# Patient Record
Sex: Female | Born: 1980 | Race: Black or African American | Hispanic: No | Marital: Single | Smoking: Never smoker
Health system: Southern US, Community
[De-identification: ages and names within clinical notes are randomized; demographics above are authoritative.]

## PROBLEM LIST (undated history)

## (undated) ENCOUNTER — Inpatient Hospital Stay (HOSPITAL_COMMUNITY): Payer: Self-pay

## (undated) ENCOUNTER — Inpatient Hospital Stay (HOSPITAL_COMMUNITY): Payer: Medicare Other

## (undated) ENCOUNTER — Ambulatory Visit: Admission: EM | Payer: Medicare Other

## (undated) DIAGNOSIS — T7840XA Allergy, unspecified, initial encounter: Secondary | ICD-10-CM

## (undated) DIAGNOSIS — O139 Gestational [pregnancy-induced] hypertension without significant proteinuria, unspecified trimester: Secondary | ICD-10-CM

## (undated) DIAGNOSIS — M1990 Unspecified osteoarthritis, unspecified site: Secondary | ICD-10-CM

## (undated) DIAGNOSIS — G5603 Carpal tunnel syndrome, bilateral upper limbs: Secondary | ICD-10-CM

## (undated) DIAGNOSIS — Z8619 Personal history of other infectious and parasitic diseases: Secondary | ICD-10-CM

## (undated) DIAGNOSIS — G43909 Migraine, unspecified, not intractable, without status migrainosus: Secondary | ICD-10-CM

## (undated) DIAGNOSIS — D649 Anemia, unspecified: Secondary | ICD-10-CM

## (undated) DIAGNOSIS — B999 Unspecified infectious disease: Secondary | ICD-10-CM

## (undated) DIAGNOSIS — B379 Candidiasis, unspecified: Secondary | ICD-10-CM

## (undated) DIAGNOSIS — O24419 Gestational diabetes mellitus in pregnancy, unspecified control: Secondary | ICD-10-CM

## (undated) DIAGNOSIS — Z973 Presence of spectacles and contact lenses: Secondary | ICD-10-CM

## (undated) DIAGNOSIS — I10 Essential (primary) hypertension: Secondary | ICD-10-CM

## (undated) DIAGNOSIS — R569 Unspecified convulsions: Secondary | ICD-10-CM

## (undated) DIAGNOSIS — F319 Bipolar disorder, unspecified: Secondary | ICD-10-CM

## (undated) HISTORY — PX: WISDOM TOOTH EXTRACTION: SHX21

## (undated) HISTORY — DX: Personal history of other infectious and parasitic diseases: Z86.19

## (undated) HISTORY — DX: Candidiasis, unspecified: B37.9

## (undated) HISTORY — DX: Unspecified osteoarthritis, unspecified site: M19.90

## (undated) HISTORY — DX: Carpal tunnel syndrome, bilateral upper limbs: G56.03

## (undated) HISTORY — DX: Presence of spectacles and contact lenses: Z97.3

## (undated) HISTORY — DX: Essential (primary) hypertension: I10

## (undated) HISTORY — DX: Unspecified infectious disease: B99.9

## (undated) HISTORY — DX: Allergy, unspecified, initial encounter: T78.40XA

## (undated) HISTORY — DX: Anemia, unspecified: D64.9

## (undated) HISTORY — PX: TONSILLECTOMY: SUR1361

---

## 2010-06-13 ENCOUNTER — Emergency Department (HOSPITAL_COMMUNITY): Admission: EM | Admit: 2010-06-13 | Discharge: 2010-06-13 | Payer: Self-pay | Admitting: Family Medicine

## 2010-07-16 ENCOUNTER — Emergency Department (HOSPITAL_COMMUNITY): Admission: EM | Admit: 2010-07-16 | Discharge: 2010-07-16 | Payer: Self-pay | Admitting: Emergency Medicine

## 2010-10-09 ENCOUNTER — Emergency Department (HOSPITAL_COMMUNITY)
Admission: EM | Admit: 2010-10-09 | Discharge: 2010-10-09 | Payer: Self-pay | Source: Home / Self Care | Admitting: Family Medicine

## 2010-12-03 ENCOUNTER — Inpatient Hospital Stay (INDEPENDENT_AMBULATORY_CARE_PROVIDER_SITE_OTHER)
Admission: RE | Admit: 2010-12-03 | Discharge: 2010-12-03 | Disposition: A | Discharge status: Home / Self Care | Payer: Medicare Other | Source: Ambulatory Visit | Attending: Emergency Medicine | Admitting: Emergency Medicine

## 2010-12-03 DIAGNOSIS — K5289 Other specified noninfective gastroenteritis and colitis: Secondary | ICD-10-CM

## 2010-12-03 LAB — POCT URINALYSIS DIPSTICK
Hgb urine dipstick: NEGATIVE
Specific Gravity, Urine: 1.025 (ref 1.005–1.030)
Urine Glucose, Fasting: NEGATIVE mg/dL
Urobilinogen, UA: 0.2 mg/dL (ref 0.0–1.0)
pH: 6.5 (ref 5.0–8.0)

## 2010-12-29 LAB — POCT URINALYSIS DIPSTICK
Glucose, UA: NEGATIVE mg/dL
Specific Gravity, Urine: 1.02 (ref 1.005–1.030)
Urobilinogen, UA: 0.2 mg/dL (ref 0.0–1.0)
pH: 6.5 (ref 5.0–8.0)

## 2010-12-29 LAB — WET PREP, GENITAL: Trich, Wet Prep: NONE SEEN

## 2010-12-29 LAB — GC/CHLAMYDIA PROBE AMP, GENITAL
Chlamydia, DNA Probe: NEGATIVE
GC Probe Amp, Genital: NEGATIVE

## 2011-01-01 LAB — WET PREP, GENITAL

## 2011-01-01 LAB — POCT URINALYSIS DIPSTICK
Bilirubin Urine: NEGATIVE
Nitrite: NEGATIVE
Protein, ur: NEGATIVE mg/dL
pH: 5.5 (ref 5.0–8.0)

## 2011-01-02 LAB — WET PREP, GENITAL
Clue Cells Wet Prep HPF POC: NONE SEEN
Yeast Wet Prep HPF POC: NONE SEEN

## 2011-01-02 LAB — POCT URINALYSIS DIPSTICK
Glucose, UA: NEGATIVE mg/dL
Ketones, ur: NEGATIVE mg/dL
Specific Gravity, Urine: 1.02 (ref 1.005–1.030)

## 2011-01-02 LAB — POCT PREGNANCY, URINE: Preg Test, Ur: NEGATIVE

## 2011-01-21 ENCOUNTER — Emergency Department (HOSPITAL_COMMUNITY)
Admission: EM | Admit: 2011-01-21 | Discharge: 2011-01-21 | Disposition: A | Discharge status: Home / Self Care | Payer: Medicare Other | Attending: Emergency Medicine | Admitting: Emergency Medicine

## 2011-01-21 DIAGNOSIS — M549 Dorsalgia, unspecified: Secondary | ICD-10-CM | POA: Insufficient documentation

## 2011-01-21 DIAGNOSIS — E669 Obesity, unspecified: Secondary | ICD-10-CM | POA: Insufficient documentation

## 2011-01-21 DIAGNOSIS — R51 Headache: Secondary | ICD-10-CM | POA: Insufficient documentation

## 2011-01-21 DIAGNOSIS — M79609 Pain in unspecified limb: Secondary | ICD-10-CM | POA: Insufficient documentation

## 2011-05-20 DIAGNOSIS — R569 Unspecified convulsions: Secondary | ICD-10-CM

## 2011-05-20 HISTORY — DX: Unspecified convulsions: R56.9

## 2011-06-04 ENCOUNTER — Emergency Department (HOSPITAL_COMMUNITY): Payer: Medicare Other

## 2011-06-04 ENCOUNTER — Emergency Department (HOSPITAL_COMMUNITY)
Admission: EM | Admit: 2011-06-04 | Discharge: 2011-06-04 | Disposition: A | Discharge status: Home / Self Care | Payer: Medicare Other | Attending: Emergency Medicine | Admitting: Emergency Medicine

## 2011-06-04 DIAGNOSIS — R569 Unspecified convulsions: Secondary | ICD-10-CM | POA: Insufficient documentation

## 2011-06-04 LAB — COMPREHENSIVE METABOLIC PANEL
ALT: 14 U/L (ref 0–35)
AST: 11 U/L (ref 0–37)
Alkaline Phosphatase: 51 U/L (ref 39–117)
CO2: 27 mEq/L (ref 19–32)
Chloride: 102 mEq/L (ref 96–112)
Creatinine, Ser: 0.6 mg/dL (ref 0.50–1.10)
GFR calc non Af Amer: >60 mL/min (ref >60)
Sodium: 138 mEq/L (ref 135–145)
Total Bilirubin: 0.2 mg/dL — ABNORMAL LOW (ref 0.3–1.2)

## 2011-06-04 LAB — CBC
HCT: 34.9 % — ABNORMAL LOW (ref 36.0–46.0)
Hemoglobin: 10.2 g/dL — ABNORMAL LOW (ref 12.0–15.0)
MCH: 20.6 pg — ABNORMAL LOW (ref 26.0–34.0)
MCHC: 29.2 g/dL — ABNORMAL LOW (ref 30.0–36.0)

## 2011-06-04 LAB — URINALYSIS, ROUTINE W REFLEX MICROSCOPIC
Glucose, UA: NEGATIVE mg/dL
Hgb urine dipstick: NEGATIVE
Protein, ur: NEGATIVE mg/dL

## 2011-06-04 LAB — POCT PREGNANCY, URINE
Preg Test, Ur: NEGATIVE
Preg Test, Ur: NEGATIVE

## 2011-06-04 LAB — ACETAMINOPHEN LEVEL: Acetaminophen (Tylenol), Serum: <15 ug/mL (ref 10–30)

## 2011-06-04 LAB — DIFFERENTIAL
Basophils Relative: 0 % (ref 0–1)
Eosinophils Absolute: 0.1 10*3/uL (ref 0.0–0.7)
Monocytes Absolute: 0.3 10*3/uL (ref 0.1–1.0)
Monocytes Relative: 5 % (ref 3–12)

## 2011-06-04 LAB — RAPID URINE DRUG SCREEN, HOSP PERFORMED
Amphetamines: NOT DETECTED
Barbiturates: NOT DETECTED
Tetrahydrocannabinol: NOT DETECTED

## 2011-06-10 ENCOUNTER — Inpatient Hospital Stay (HOSPITAL_COMMUNITY)
Admission: EM | Admit: 2011-06-10 | Discharge: 2011-06-12 | DRG: 101 | Disposition: A | Discharge status: Home / Self Care | Payer: Medicare Other | Attending: Family Medicine | Admitting: Family Medicine

## 2011-06-10 DIAGNOSIS — G43909 Migraine, unspecified, not intractable, without status migrainosus: Secondary | ICD-10-CM | POA: Diagnosis present

## 2011-06-10 DIAGNOSIS — G40802 Other epilepsy, not intractable, without status epilepticus: Principal | ICD-10-CM | POA: Diagnosis present

## 2011-06-10 LAB — CBC
Hemoglobin: 11.8 g/dL — ABNORMAL LOW (ref 12.0–15.0)
MCH: 21.2 pg — ABNORMAL LOW (ref 26.0–34.0)
MCV: 68.8 fL — ABNORMAL LOW (ref 78.0–100.0)
Platelets: 495 10*3/uL — ABNORMAL HIGH (ref 150–400)
RBC: 5.57 MIL/uL — ABNORMAL HIGH (ref 3.87–5.11)

## 2011-06-10 LAB — DIFFERENTIAL
Basophils Relative: 0 % (ref 0–1)
Eosinophils Absolute: 0 10*3/uL (ref 0.0–0.7)
Lymphocytes Relative: 20 % (ref 12–46)
Neutro Abs: 7.1 10*3/uL (ref 1.7–7.7)

## 2011-06-10 LAB — BASIC METABOLIC PANEL
BUN: 7 mg/dL (ref 6–23)
CO2: 27 mEq/L (ref 19–32)
Calcium: 9.8 mg/dL (ref 8.4–10.5)
Creatinine, Ser: 0.58 mg/dL (ref 0.50–1.10)
Glucose, Bld: 134 mg/dL — ABNORMAL HIGH (ref 70–99)

## 2011-06-10 LAB — URINALYSIS, ROUTINE W REFLEX MICROSCOPIC
Bilirubin Urine: NEGATIVE
Glucose, UA: NEGATIVE mg/dL
Hgb urine dipstick: NEGATIVE
Ketones, ur: NEGATIVE mg/dL
Protein, ur: NEGATIVE mg/dL

## 2011-06-11 ENCOUNTER — Inpatient Hospital Stay (HOSPITAL_COMMUNITY): Payer: Medicare Other

## 2011-06-11 LAB — DRUGS OF ABUSE SCREEN W/O ALC, ROUTINE URINE
Barbiturate Quant, Ur: NEGATIVE
Creatinine,U: 202.4 mg/dL
Marijuana Metabolite: NEGATIVE
Methadone: NEGATIVE
Opiate Screen, Urine: POSITIVE — AB

## 2011-06-11 LAB — PROLACTIN: Prolactin: 18.7 ng/mL

## 2011-06-11 MED ORDER — IOHEXOL 300 MG/ML  SOLN: 100.0000 mL | Freq: Once | INTRAMUSCULAR | Status: AC | PRN

## 2011-06-12 NOTE — Procedures (Unsigned)
EEG NUMBER:  REFERRING PHYSICIAN:  Levie Heritage, MD  HISTORY:  A 30 year old woman admitted with seizure.  She had a similar event few months ago.  There are no obvious provocating factors.  EEG is for evaluation.  MEDICATIONS:  She is on Keppra, morphine, Zofran, Tylenol, hydrocodone.  EEG DURATION:  21.5 minutes of EEG recording time.  EEG DESCRIPTION:  This is a routine 16-channel adult EEG recording with one-channel devoted to limited EKG recording.  Activation procedure was performed during the photic stimulation and the study is performed in the awake and sleep state.  As the EEG opens up, I noticed that the posterior dominant rhythm is well-developed and 8 Hz frequency.  The rhythm is symmetrical with a low amplitude up to 15 microvolts at the most.  Throughout the EEG is scattered EKG artifact.  No driving was noted with the photic stimulation in posterior leads.  Most of the study is reflective of light sleep stage with vertex waves and synchronous sleep spindles. There are no electrographic seizures or epileptiform discharges recorded on the study.  EEG INTERPRETATION:  This is a normal awake and sleep EEG.  There is no evidence to suggest electrographic seizures or epileptiform discharges based on the study.          ______________________________ Levie Heritage, MD    [id number] D:  06/11/2011 18:31:32  T:  06/12/2011 01:00:19  Job #:  [id number]

## 2011-06-13 ENCOUNTER — Emergency Department (HOSPITAL_COMMUNITY)
Admission: EM | Admit: 2011-06-13 | Discharge: 2011-06-14 | Disposition: A | Discharge status: Home / Self Care | Payer: Medicare Other | Attending: Emergency Medicine | Admitting: Emergency Medicine

## 2011-06-13 DIAGNOSIS — G40909 Epilepsy, unspecified, not intractable, without status epilepticus: Secondary | ICD-10-CM | POA: Insufficient documentation

## 2011-06-13 DIAGNOSIS — B370 Candidal stomatitis: Secondary | ICD-10-CM | POA: Insufficient documentation

## 2011-06-13 DIAGNOSIS — R51 Headache: Secondary | ICD-10-CM | POA: Insufficient documentation

## 2011-06-14 NOTE — Discharge Summary (Signed)
  NAMECURTIS, URIARTE               ACCOUNT NO.:  [id number]  MEDICAL RECORD NO.:  [id number]  LOCATION:                                 FACILITY:  PHYSICIAN:  Tarry Kos, MD       DATE OF BIRTH:  13-Aug-1981  DATE OF ADMISSION:  06/10/2011 DATE OF DISCHARGE:  06/12/2011                              DISCHARGE SUMMARY   DISCHARGE DIAGNOSES: 1. New diagnosis of seizures, new on Keppra. 2. History of migraine headaches.  SUMMARY OF HOSPITAL COURSE:  Ms. Escalona is a 30 year old female who presented to the emergency department with a seizure.  This was her second seizure in the last several months.  She was admitted.  Neurology was consulted.  It was recommended for her to be placed on Keppra.  She has been on Keppra for the last several days and 500 mg twice a day and has not had any seizure since she has been here.  She has been afebrile. Vital signs have been stable.  It was thought that this was an unprovoked seizure with no recognizable underlying etiology.  Her EEG was normal.  PHYSICAL EXAMINATION:  VITAL SIGNS:  She has been afebrile.  Vital signs stable. GENERAL:  Alert and oriented x4, in no apparent distress, cooperative, friendly. HEENT:  Extraocular muscles are intact.  Pupils are equal and reactive to light.  Oropharynx is clear.  Mucous membranes are moist. NECK:  No JVD.  No carotid bruits. CARDIAC:  Regular rate and rhythm without murmurs, rubs, or gallops. CHEST:  Clear to auscultation bilaterally.  No wheezes, rhonchi, or rales. ABDOMEN:  Soft, nontender, nondistended.  Positive bowel sounds.  No hepatosplenomegaly. EXTREMITIES:  No clubbing, cyanosis, or edema. PSYCH:  Normal mood and affect. NEURO:  No focal neurologic deficits.  She is being discharged home on Keppra 500 mg twice a day.  Follow up with Dr. Denton Meek of [facility], phone #[phone] in 2-4 weeks.  We will also have a follow up with primary care physician in 1 week.  She is  being discharged home and back to her baseline health status.          ______________________________ Tarry Kos, MD     RD/MEDQ  D:  06/12/2011  T:  06/12/2011  Job:  [id number]  Electronically Signed by Tarry Kos MD on 06/14/2011 02:09:30 PM

## 2011-06-16 LAB — OPIATE, QUANTITATIVE, URINE
Codeine Urine: NEGATIVE NG/ML
Hydrocodone: 826 NG/ML — ABNORMAL HIGH
Morphine, Confirm: 2841 NG/ML — ABNORMAL HIGH
Oxycodone, ur: NEGATIVE NG/ML

## 2011-06-16 NOTE — Consult Note (Signed)
[name] [name], [name] [name]               ACCOUNT NO.:  [id number]  MEDICAL RECORD NO.:  [id number]  LOCATION:                                 FACILITY:  PHYSICIAN:  [name] Heritage, MD       DATE OF BIRTH:  1981/08/20  DATE OF CONSULTATION:  06/11/2011 DATE OF DISCHARGE:                                CONSULTATION   REASON FOR CONSULTATION:  Seizure-like event.  HISTORY OF PRESENT ILLNESS:  This is a 30 year old obese female with no other past medical history.  The patient has been reported per boyfriend who have been seen at [facility] approximately 1 week ago for seizure- like event.  At that time, CT of head was obtained, which was normal. The patient's labs were normal with the exception of opiate positive drug screen.  The patient was released from the emergency department at that time on no antiepileptics.  The patient was brought back to the emergency department on June 10, 2011, as it was noted she had another seizure-like event, which was non-provoked.  Unfortunately, the boyfriend who is at bedside is a very poor historian, but does state that she had shaking all over and questionably bit her tongue.  On exam, I do note that she does have a significant fissure on the right aspect of her tongue, but it is unusual for as a normal bitten tongue and a seizure event is oftentimes on the vermilion border and hers is dead center in the mid line aspect of her tongue.  At the present time, the patient has had no further seizures, but is complaining of ear pain on the right and shoulder plain pain on the left.  The patient is alert and oriented, able to answer all my questions and follow my commands.  PAST MEDICAL HISTORY:  Negative.  MEDICATIONS:  No home medications.  While in the hospital, she has been placed on Keppra 500 mg b.i.d., Vicodin, Zofran, and morphine.  FAMILY HISTORY:  Negative.  SOCIAL HISTORY:  The patient denies smoking, alcohol, or drug use. However, her last  visit, she was opiate positive.  REVIEW OF SYSTEMS:  All 10 review of systems have been reviewed and are negative with the exception of above.  PHYSICAL EXAMINATION:  VITAL SIGNS:  Temperature is 98.5, pulse 99, respiration 19, systolic blood pressure is 114, diastolic 73. GENERAL:  The patient is alert and oriented x3.  Carries out two-three step commands without difficulty.  Pupils are equal, round, reactive to light, and accommodating.  Conjugate gaze.  Extraocular movements are intact.  Visual fields grossly intact.  Face symmetrical.  Tongue is midline.  Uvula is midline.  Facial sensation is full to pinprick, light touch from V1-V3.  Shoulder shrug and head turn is within normal limits. NEUROLOGIC:  Coordination, smooth with both heel-to-shin and finger-to- nose.  The patient's motor is 5/5 throughout with significant giveaway strength in the upper extremities.  However, when her attention is diverted, I do get a full 5/5 strength.  Her left shoulder abduction was very hard to assess as she has significant shoulder pain and would not give me full effort on that shoulder.  Deep tendon reflexes  were depressed throughout.  She has downgoing toes bilaterally.  The patient showed no drift in the upper extremities or lower extremities. PULMONARY:  Clear to auscultation. CARDIOVASCULAR:  S1-S2 is audible. NECK:  Negative for murmurs.  Sensation is full to pinprick, light touch throughout.  LABS:  Sodium 138, potassium 3.8, chloride 102, CO2 27, BUN is 7, creatinine 0.58, glucose is 134.  White blood cell count 9.4, hemoglobin 11.8, hematocrit 38.3, platelets 495.  Urinalysis was negative.  CT of head is pending at this time.  ASSESSMENT:  This is a 29-year female with now two episodes of unprovoked convulsions.  First being approximately 1 week ago. The second being on August 22.  The patient was brought to [facility] for further evaluation.  Due to this being her second  episode seemingly unprovoked, she has been placed on Keppra 500 mg b.i.d.  MRI couldn't be obtained due to obesity.  For that reason, a CT of head is requested.  An EEG is also pending.  RECOMMENDATIONS AND PLAN:  At this time, would be recommend a CT of brain to look for any structural abnormalities.  We will follow the patient and both the CT of head and EEG.  I have a long discussion with the patient and her boyfriend who is at bedside that she is not to drive for 6 months.  She is not to be any standing water or heights for 6 months, or until cleared by a neurologist or her primary care physician as an outpatient.  She fully understood this and was in agreement.   She will also inform her gyneacologist of her seizure in case of getting pregnant. If the patient's CT of brain is negative and EEG is negative, we would recommend the patient have a follow-up appointment with Dr. Denton Meek of [facility], work [phone] for further evaluation and follow-up on seizure event.     [name] Morn, PA-C  I have seen the patient and agree witha bove clinical findings. ______________________________ [name] Heritage, MD    DS/MEDQ  D:  06/11/2011  T:  06/11/2011  Job:  [id number]  Electronically Signed by [name] Morn PA-C on 06/15/2011 02:47:06 PM Electronically Signed by [name] Heritage MD on 06/16/2011 08:03:38 AM

## 2011-07-16 ENCOUNTER — Inpatient Hospital Stay (HOSPITAL_COMMUNITY)
Admission: RE | Admit: 2011-07-16 | Discharge: 2011-07-16 | Disposition: A | Discharge status: Home / Self Care | Payer: Medicare Other | Source: Ambulatory Visit | Attending: Emergency Medicine | Admitting: Emergency Medicine

## 2011-07-22 ENCOUNTER — Emergency Department (HOSPITAL_COMMUNITY): Payer: Medicare Other

## 2011-07-22 ENCOUNTER — Emergency Department (HOSPITAL_COMMUNITY)
Admission: EM | Admit: 2011-07-22 | Discharge: 2011-07-22 | Disposition: A | Discharge status: Home / Self Care | Payer: Medicare Other | Attending: Emergency Medicine | Admitting: Emergency Medicine

## 2011-07-22 DIAGNOSIS — Z79899 Other long term (current) drug therapy: Secondary | ICD-10-CM | POA: Insufficient documentation

## 2011-07-22 DIAGNOSIS — G40909 Epilepsy, unspecified, not intractable, without status epilepticus: Secondary | ICD-10-CM | POA: Insufficient documentation

## 2011-07-22 DIAGNOSIS — R071 Chest pain on breathing: Secondary | ICD-10-CM | POA: Insufficient documentation

## 2011-07-22 DIAGNOSIS — R51 Headache: Secondary | ICD-10-CM | POA: Insufficient documentation

## 2011-07-22 LAB — URINALYSIS, ROUTINE W REFLEX MICROSCOPIC
Bilirubin Urine: NEGATIVE
Glucose, UA: NEGATIVE mg/dL
Hgb urine dipstick: NEGATIVE
Specific Gravity, Urine: 1.021 (ref 1.005–1.030)
Urobilinogen, UA: 0.2 mg/dL (ref 0.0–1.0)

## 2011-07-22 LAB — BASIC METABOLIC PANEL
Calcium: 9 mg/dL (ref 8.4–10.5)
GFR calc Af Amer: >90 mL/min (ref >90)
GFR calc non Af Amer: >90 mL/min (ref >90)
Glucose, Bld: 111 mg/dL — ABNORMAL HIGH (ref 70–99)
Potassium: 4.2 mEq/L (ref 3.5–5.1)
Sodium: 137 mEq/L (ref 135–145)

## 2011-07-22 LAB — URINE MICROSCOPIC-ADD ON
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
## 2011-08-15 ENCOUNTER — Emergency Department (HOSPITAL_COMMUNITY)
Admission: EM | Admit: 2011-08-15 | Discharge: 2011-08-16 | Disposition: A | Discharge status: Home / Self Care | Payer: Medicare Other | Attending: Emergency Medicine | Admitting: Emergency Medicine

## 2011-08-15 DIAGNOSIS — R569 Unspecified convulsions: Secondary | ICD-10-CM | POA: Insufficient documentation

## 2011-08-15 LAB — URINALYSIS, ROUTINE W REFLEX MICROSCOPIC
Glucose, UA: NEGATIVE mg/dL
Ketones, ur: NEGATIVE mg/dL
Leukocytes, UA: NEGATIVE
Nitrite: NEGATIVE
Protein, ur: NEGATIVE mg/dL
Urobilinogen, UA: 0.2 mg/dL (ref 0.0–1.0)

## 2011-08-15 LAB — CBC
HCT: 37 % (ref 36.0–46.0)
MCHC: 30.3 g/dL (ref 30.0–36.0)
Platelets: 494 10*3/uL — ABNORMAL HIGH (ref 150–400)
RDW: 17.5 % — ABNORMAL HIGH (ref 11.5–15.5)
WBC: 10 10*3/uL (ref 4.0–10.5)

## 2011-08-15 LAB — POCT PREGNANCY, URINE: Preg Test, Ur: NEGATIVE

## 2011-08-15 LAB — POCT I-STAT, CHEM 8
Calcium, Ion: 1.19 mmol/L (ref 1.12–1.32)
HCT: 40 % (ref 36.0–46.0)
Hemoglobin: 13.6 g/dL (ref 12.0–15.0)
Sodium: 140 mEq/L (ref 135–145)
TCO2: 24 mmol/L (ref 0–100)

## 2011-08-16 LAB — DIFFERENTIAL
Basophils Absolute: 0 10*3/uL (ref 0.0–0.1)
Eosinophils Absolute: 0 10*3/uL (ref 0.0–0.7)
Lymphocytes Relative: 16 % (ref 12–46)
Monocytes Absolute: 0.3 10*3/uL (ref 0.1–1.0)
Neutrophils Relative %: 81 % — ABNORMAL HIGH (ref 43–77)

## 2011-08-31 ENCOUNTER — Emergency Department (HOSPITAL_COMMUNITY)
Admission: EM | Admit: 2011-08-31 | Discharge: 2011-08-31 | Disposition: A | Discharge status: Home / Self Care | Payer: Medicare Other | Attending: Emergency Medicine | Admitting: Emergency Medicine

## 2011-08-31 ENCOUNTER — Encounter: Payer: Self-pay | Admitting: *Deleted

## 2011-08-31 DIAGNOSIS — R5381 Other malaise: Secondary | ICD-10-CM | POA: Insufficient documentation

## 2011-08-31 DIAGNOSIS — R5383 Other fatigue: Secondary | ICD-10-CM | POA: Insufficient documentation

## 2011-08-31 DIAGNOSIS — G40909 Epilepsy, unspecified, not intractable, without status epilepticus: Secondary | ICD-10-CM | POA: Insufficient documentation

## 2011-08-31 DIAGNOSIS — Z79899 Other long term (current) drug therapy: Secondary | ICD-10-CM | POA: Insufficient documentation

## 2011-08-31 DIAGNOSIS — Z760 Encounter for issue of repeat prescription: Secondary | ICD-10-CM | POA: Insufficient documentation

## 2011-08-31 HISTORY — DX: Unspecified convulsions: R56.9

## 2011-08-31 HISTORY — DX: Bipolar disorder, unspecified: F31.9

## 2011-08-31 MED ORDER — LEVETIRACETAM 500 MG PO TABS: 500.0000 mg | ORAL_TABLET | Freq: Two times a day (BID) | ORAL | Status: DC

## 2011-08-31 NOTE — ED Notes (Signed)
Pt states "I've been feeling tired, really feeling bad today and last night"; pt admits to taking keppra 7 hours apart; pt provided instruction, lamictal to be taken 12 hrs apart unless otherwise directed by prescribing physician; pt verbalized understanding.

## 2011-08-31 NOTE — ED Provider Notes (Signed)
History    patient with history of seizure disorder is presenting today complaining of feeling fatigued. Her primary reason to be seen in the ED today is for medication refill. She states she has ran out of her Keppra. She denies any recent seizure. She denies fever, headache, chest pain shortness of breath, abdominal pain, nausea, vomiting, diarrhea, weakness, or numbness.  She does mention that she feels pretty tired prior to having a seizure episode. she states she was unable to follow up with her primary care doctor because he is out of the office.    CSN: [id number] Arrival date & time: 08/31/2011  3:39 PM   First MD Initiated Contact with Patient 08/31/11 1925      Chief Complaint  Patient presents with  . Seizures    (Consider location/radiation/quality/duration/timing/severity/associated sxs/prior treatment) HPI  Past Medical History  Diagnosis Date  . Seizures   . Bipolar affective disorder     History reviewed. No pertinent past surgical history.  No family history on file.  History  Substance Use Topics  . Smoking status: Never Smoker   . Smokeless tobacco: Not on file  . Alcohol Use: No    OB History    Grav Para Term Preterm Abortions TAB SAB Ect Mult Living                  Review of Systems  All other systems reviewed and are negative.    Allergies  Review of patient's allergies indicates no known allergies.  Home Medications   Current Outpatient Rx  Name Route Sig Dispense Refill  . HYDROCODONE-ACETAMINOPHEN 5-500 MG PO TABS Oral Take 1 tablet by mouth 2 (two) times daily as needed. For pain.     . IBUPROFEN 200 MG PO TABS Oral Take 600 mg by mouth every 6 (six) hours as needed. For pain.     Marland Kitchen LEVETIRACETAM 500 MG PO TABS Oral Take 500 mg by mouth 2 (two) times daily.       Wt 300 lb (136.079 kg)  LMP 07/29/2011  Physical Exam  Constitutional: She is oriented to person, place, and time. She appears well-developed and well-nourished.   Morbidly obese.  HENT:  Head: Normocephalic and atraumatic.  Mouth/Throat: Oropharynx is clear and moist.  Eyes: Conjunctivae and EOM are normal. Pupils are equal, round, and reactive to light.  Neck: Normal range of motion. Neck supple.  Cardiovascular: Normal rate and regular rhythm.   Pulmonary/Chest: Effort normal and breath sounds normal. She exhibits no tenderness.  Abdominal: Soft. Bowel sounds are normal.  Musculoskeletal: Normal range of motion.  Neurological: She is alert and oriented to person, place, and time.  Skin: Skin is warm and dry.  Psychiatric: She has a normal mood and affect. Her behavior is normal.    ED Course  Procedures (including critical care time)  Labs Reviewed - No data to display No results found.   No diagnosis found.    MDM  I will refill her Keppra. Please disregard the lamictal medication under the nursing ED note. Patient is not on Lamictal.  I also instruct patient to followup with primary care doctor for further management of her condition.  The patient is currently in no acute distress, vital signs stable and she is stable to be discharged.        Fayrene Helper, PA 08/31/11 1944

## 2011-09-01 NOTE — ED Provider Notes (Signed)
Medical screening examination/treatment/procedure(s) were performed by non-physician practitioner and as supervising physician I was immediately available for consultation/collaboration.   Laray Anger, DO 09/01/11 0040

## 2011-09-04 ENCOUNTER — Emergency Department (HOSPITAL_COMMUNITY)
Admission: EM | Admit: 2011-09-04 | Discharge: 2011-09-04 | Disposition: A | Discharge status: Home / Self Care | Payer: Medicare Other | Attending: Emergency Medicine | Admitting: Emergency Medicine

## 2011-09-04 ENCOUNTER — Encounter (HOSPITAL_COMMUNITY): Payer: Self-pay | Admitting: Emergency Medicine

## 2011-09-04 DIAGNOSIS — S4980XA Other specified injuries of shoulder and upper arm, unspecified arm, initial encounter: Secondary | ICD-10-CM | POA: Insufficient documentation

## 2011-09-04 DIAGNOSIS — X58XXXA Exposure to other specified factors, initial encounter: Secondary | ICD-10-CM | POA: Insufficient documentation

## 2011-09-04 DIAGNOSIS — R569 Unspecified convulsions: Secondary | ICD-10-CM

## 2011-09-04 DIAGNOSIS — G40909 Epilepsy, unspecified, not intractable, without status epilepticus: Secondary | ICD-10-CM | POA: Insufficient documentation

## 2011-09-04 DIAGNOSIS — S46909A Unspecified injury of unspecified muscle, fascia and tendon at shoulder and upper arm level, unspecified arm, initial encounter: Secondary | ICD-10-CM | POA: Insufficient documentation

## 2011-09-04 DIAGNOSIS — S46009A Unspecified injury of muscle(s) and tendon(s) of the rotator cuff of unspecified shoulder, initial encounter: Secondary | ICD-10-CM

## 2011-09-04 MED ORDER — LEVETIRACETAM 500 MG PO TABS: 750.0000 mg | ORAL_TABLET | Freq: Two times a day (BID) | ORAL | Status: DC

## 2011-09-04 MED ORDER — HYDROCODONE-ACETAMINOPHEN 5-500 MG PO TABS: 1.0000 | ORAL_TABLET | Freq: Four times a day (QID) | ORAL | Status: DC | PRN

## 2011-09-04 NOTE — ED Notes (Signed)
Pt to ED with s/p seizure. Pt post-ictal per EMS arrival

## 2011-09-04 NOTE — ED Notes (Signed)
Pt to ED with s/p seizure. Pt states has been taking meds as prescribed. Pt with c/o left arm pain. Pt states did not injure her arm during seizure per the boyfriend. Pt sitting in chair at bedside. Pt states "it feels better to sit here". Pt alert and oriented x 4. Pt states has an appt with PCP. Pt awaits eval.

## 2011-09-04 NOTE — ED Notes (Signed)
Pt given discharge instructions and verbalizes understanding  

## 2011-09-04 NOTE — ED Notes (Signed)
MD to bedside for eval

## 2011-09-04 NOTE — ED Notes (Signed)
[id number]<AV> Expected date:09/04/11<BR> Expected time:12:53 AM<BR> Means of arrival:Ambulance<BR> Comments:<BR> seizure

## 2011-09-04 NOTE — ED Provider Notes (Signed)
History     CSN: [id number] Arrival date & time: 09/04/2011  1:10 AM   First MD Initiated Contact with Patient 09/04/11 0345      Chief Complaint  Patient presents with  . Seizures    (Consider location/radiation/quality/duration/timing/severity/associated sxs/prior treatment) HPI This is a 30 year old black female with a history of seizures. Her seizure disorder was diagnosed in the ED and she has only had followup in the ED. She's been getting her Keppra refills from the ED. She has failed to followup with neurology as recommended. She had a seizure about midnight which lasted about 5 minutes it was generalized tonic-clonic witnessed by her significant other. She bit her tongue was not incontinent. She is now complaining of generalized myalgias as well as pain in the left shoulder she states the pain in the left shoulder is severe and worse with movement particularly rotation of the shoulder. She states she has been compliant with her Keppra. She's not aware of anything that triggered the seizure.  Past Medical History  Diagnosis Date  . Seizures   . Bipolar affective disorder     No past surgical history on file.  No family history on file.  History  Substance Use Topics  . Smoking status: Never Smoker   . Smokeless tobacco: Not on file  . Alcohol Use: No    OB History    Grav Para Term Preterm Abortions TAB SAB Ect Mult Living                  Review of Systems  All other systems reviewed and are negative.    Allergies  Review of patient's allergies indicates no known allergies.  Home Medications   Current Outpatient Rx  Name Route Sig Dispense Refill  . LEVETIRACETAM 500 MG PO TABS Oral Take 1 tablet (500 mg total) by mouth 2 (two) times daily. 60 tablet 0  . HYDROCODONE-ACETAMINOPHEN 5-500 MG PO TABS Oral Take 1 tablet by mouth 2 (two) times daily as needed. For pain.    . IBUPROFEN 200 MG PO TABS Oral Take 600 mg by mouth every 6 (six) hours as needed.  For pain.       BP 135/87  Pulse 105  Temp(Src) 98.7 F (37.1 C) (Oral)  Resp 18  SpO2 98%  LMP 07/29/2011  Physical Exam General: Well-developed, well-nourished, obese female in no acute distress; appearance consistent with age of record HENT: normocephalic, atraumatic Eyes: pupils equal round and reactive to light; extraocular muscles intact Neck: supple Heart: regular rate and rhythm Lungs: Normal respiratory effort and excursion Abdomen: soft; obese Extremities: No deformity; full range of motion except for left shoulder limited by pain; left rotator cuff tenderness and pain on movement Neurologic: Awake, alert and oriented; motor function intact in all extremities and symmetric; no facial droop Skin: Warm and dry     ED Course  Procedures (including critical care time)    MDM  The importance of neurologic followup was stressed to the patient. Considering that she continues to have breakthrough seizures at thousand milligrams of Keppra daily we will increase her tube 750 mg twice daily.         Hanley Seamen, MD 09/04/11 [phone]

## 2011-09-04 NOTE — ED Notes (Signed)
Pt cont to await MD eval. Pt sleeping on stretcher. Charge RN is aware of pt wait time. Pt denies any needs. Pt cont to c/o pain to left arm. Boyfriend at bedside. Will cont to monitor

## 2011-09-10 ENCOUNTER — Emergency Department (HOSPITAL_COMMUNITY)
Admission: EM | Admit: 2011-09-10 | Discharge: 2011-09-10 | Discharge status: Against Medical Advice | Payer: Medicare Other | Attending: Emergency Medicine | Admitting: Emergency Medicine

## 2011-09-10 ENCOUNTER — Other Ambulatory Visit: Payer: Self-pay

## 2011-09-10 ENCOUNTER — Encounter (HOSPITAL_COMMUNITY): Payer: Self-pay | Admitting: Physical Medicine and Rehabilitation

## 2011-09-10 ENCOUNTER — Emergency Department (HOSPITAL_COMMUNITY): Payer: Medicare Other

## 2011-09-10 DIAGNOSIS — W07XXXA Fall from chair, initial encounter: Secondary | ICD-10-CM | POA: Insufficient documentation

## 2011-09-10 DIAGNOSIS — R569 Unspecified convulsions: Secondary | ICD-10-CM

## 2011-09-10 DIAGNOSIS — G40909 Epilepsy, unspecified, not intractable, without status epilepticus: Secondary | ICD-10-CM | POA: Insufficient documentation

## 2011-09-10 DIAGNOSIS — Z79899 Other long term (current) drug therapy: Secondary | ICD-10-CM | POA: Insufficient documentation

## 2011-09-10 DIAGNOSIS — IMO0001 Reserved for inherently not codable concepts without codable children: Secondary | ICD-10-CM | POA: Insufficient documentation

## 2011-09-10 DIAGNOSIS — M25519 Pain in unspecified shoulder: Secondary | ICD-10-CM | POA: Insufficient documentation

## 2011-09-10 DIAGNOSIS — S01502A Unspecified open wound of oral cavity, initial encounter: Secondary | ICD-10-CM | POA: Insufficient documentation

## 2011-09-10 DIAGNOSIS — W503XXA Accidental bite by another person, initial encounter: Secondary | ICD-10-CM | POA: Insufficient documentation

## 2011-09-10 DIAGNOSIS — S01111A Laceration without foreign body of right eyelid and periocular area, initial encounter: Secondary | ICD-10-CM

## 2011-09-10 DIAGNOSIS — S0180XA Unspecified open wound of other part of head, initial encounter: Secondary | ICD-10-CM | POA: Insufficient documentation

## 2011-09-10 DIAGNOSIS — M25512 Pain in left shoulder: Secondary | ICD-10-CM

## 2011-09-10 DIAGNOSIS — R404 Transient alteration of awareness: Secondary | ICD-10-CM | POA: Insufficient documentation

## 2011-09-10 DIAGNOSIS — R51 Headache: Secondary | ICD-10-CM | POA: Insufficient documentation

## 2011-09-10 MED ORDER — OXYCODONE-ACETAMINOPHEN 5-325 MG PO TABS
1.0000 | ORAL_TABLET | Freq: Once | ORAL | Status: AC
  Administered 2011-09-10: 1 via ORAL
  Filled 2011-09-10: qty 1

## 2011-09-10 MED ORDER — LIDOCAINE-EPINEPHRINE 2 %-1:100000 IJ SOLN: 20.0000 mL | Freq: Once | INTRAMUSCULAR | Status: DC

## 2011-09-10 MED ORDER — LEVETIRACETAM 750 MG PO TABS
1250.0000 mg | ORAL_TABLET | Freq: Once | ORAL | Status: DC
  Filled 2011-09-10: qty 1

## 2011-09-10 MED ORDER — HYDROCODONE-ACETAMINOPHEN 5-325 MG PO TABS: 2.0000 | ORAL_TABLET | ORAL | Status: DC | PRN

## 2011-09-10 MED ORDER — LEVETIRACETAM 500 MG PO TABS
1250.0000 mg | ORAL_TABLET | Freq: Once | ORAL | Status: AC
  Administered 2011-09-10: 1250 mg via ORAL
  Filled 2011-09-10: qty 2

## 2011-09-10 NOTE — ED Provider Notes (Signed)
History    patient with history of seizures present to the ED with an episode of seizure today.  Per family member, patient was sitting in the chair after eating when family member noticed that she begins to stare into space, follows by convulsion and subsequently fallen to the floor.  Family members notice patient exhibits jerky movements and foaming in mouth.  She subsequently suffered a small laceration overlying right eyebrow, and she also bit her tongue.  Patient currently complaining of headache. She denies bowel or urine incontinence. She denies numbness or weakness. She does complain of myalgias. Her last seizure was 1 week ago. Patient states she initially was taking Keppra 500 mg twice a day. Since last seizure episode one week ago, Keppra has been increased to 750 mg twice a day. She has been taken the medication as prescribed, but continues to have breakthrough seizure.  She denies any precipitating factors. She does not follow with a neurologist.  She usually comes to the ED for medication refill.    CSN: [id number] Arrival date & time: 09/10/2011  3:18 PM   First MD Initiated Contact with Patient 09/10/11 1520      Chief Complaint  Patient presents with  . Seizures    (Consider location/radiation/quality/duration/timing/severity/associated sxs/prior treatment) Patient is a 30 y.o. female presenting with seizures. The history is provided by the patient. No language interpreter was used.  Seizures  This is a recurrent problem. The current episode started 1 to 2 hours ago. The problem has been resolved. The most recent episode lasted 2 to 5 minutes. Associated symptoms include headaches. Pertinent negatives include no neck stiffness. Characteristics include eye blinking, loss of consciousness and bit tongue. Characteristics do not include bowel incontinence. The episode was witnessed. There was no sensation of an aura present. The seizures did not continue in the ED. The seizure(s) had no  focality. There has been no fever.    Past Medical History  Diagnosis Date  . Seizures   . Bipolar affective disorder     No past surgical history on file.  No family history on file.  History  Substance Use Topics  . Smoking status: Never Smoker   . Smokeless tobacco: Not on file  . Alcohol Use: No    OB History    Grav Para Term Preterm Abortions TAB SAB Ect Mult Living                  Review of Systems  Gastrointestinal: Negative for bowel incontinence.  Neurological: Positive for seizures, loss of consciousness and headaches.  All other systems reviewed and are negative.    Allergies  Review of patient's allergies indicates no known allergies.  Home Medications   Current Outpatient Rx  Name Route Sig Dispense Refill  . HYDROCODONE-ACETAMINOPHEN 5-500 MG PO TABS Oral Take 1-2 tablets by mouth every 6 (six) hours as needed. For pain. 20 tablet 0  . IBUPROFEN 200 MG PO TABS Oral Take 600 mg by mouth every 6 (six) hours as needed. For pain.     Marland Kitchen LEVETIRACETAM 500 MG PO TABS Oral Take 1.5 tablets (750 mg total) by mouth 2 (two) times daily. 90 tablet 0    LMP 07/29/2011  Physical Exam  Constitutional: She is oriented to person, place, and time. She appears well-developed and well-nourished. No distress.  HENT:  Head: Normocephalic and atraumatic.         Superficial laceration to R lateral aspect of tongue, not actively bleeding  Eyes: Conjunctivae  and EOM are normal. Pupils are equal, round, and reactive to light.  Neck: Normal range of motion. Neck supple.  Cardiovascular: Normal rate and regular rhythm.  Exam reveals no gallop and no friction rub.   No murmur heard. Pulmonary/Chest: Effort normal. No respiratory distress. She has no wheezes.  Abdominal: Soft. Bowel sounds are normal. There is no tenderness.  Musculoskeletal: Normal range of motion.       Tenderness to L shoulder with decreased ROM.  No obvious deformity noted.  Sensation intact.    Neurological: She is alert and oriented to person, place, and time. Coordination and gait normal. GCS eye subscore is 4. GCS verbal subscore is 5. GCS motor subscore is 6.  Skin: Skin is warm and dry.    ED Course  LACERATION REPAIR Date/Time: 09/10/2011 4:17 PM Performed by: Fayrene Helper Authorized by: Fayrene Helper Consent: Verbal consent obtained. Written consent not obtained. Risks and benefits: risks, benefits and alternatives were discussed Consent given by: patient Location: R eyebrow. Laceration length: 2 cm Foreign bodies: no foreign bodies Tendon involvement: none Nerve involvement: none Vascular damage: no Irrigation solution: tap water Amount of cleaning: standard Debridement: none Degree of undermining: none Skin closure: glue Approximation: close Approximation difficulty: simple Patient tolerance: Patient tolerated the procedure well with no immediate complications.   (including critical care time)  Labs Reviewed - No data to display No results found.   No diagnosis found.    MDM  Patient has had  breakthrough seizure not well controlled with her current medication.  She has had 4 prior head CT scans in the past year. Therefore, I would not repeat another CT scan. She has had prior neurologic follow up with Dr. Regino Schultze from [facility] neurology, but has not followed up.  She has not had an brain MRI due to her body habitus.  I will consult with neurology for further management. We will also suture the right eyebrow laceration. Patient is currently in no acute distress.   4:01 PM Patient refused all lab work today. She request for her R eyebrow laceration to be glued only.  Neurology was consulted and seen pt in ED.  Neurology will schedule f/u appointment for further management.  Neurologist request given pt Keppra 1,250mg  PO today for loading dose.  Pt has 750mg  this AM.     5:48 PM Discussed with pt.  Pt refused to stay for further workup.  Pt refuse all  labwork.  Pt agrees to sign AMA.  I discuss risk including repeated seizure, or death.  Pt voice understanding.  I discussed with neurologist, who will set up f/u visit for further care. L shoulder xray shows mild subluxation.  Pain medication and therapy instruction given.        Fayrene Helper, PA 09/10/11 1757

## 2011-09-10 NOTE — Consult Note (Addendum)
Reason for Consult: "seizure"  HPI: [name] [name] is an 30 y.o. female. Who has been having recurrent generalized seizures since end of August of this year. She has been to ED at least 8 times and notes that she has had difficulty with follow-ing up. She maintains that she has been compliant and denies illicit drug or alcohol use. She had an EEG in August, which was normal. She had 3 CT head's, which were also within normal limits. Today she had a 5 min. seizure episode and was found face down on the floor. She is complaining of left shoulder pain since the fall, but thinks that it may be due to exercise.   Past Medical History  Diagnosis Date  . Seizures   . Bipolar affective disorder    PSH: No past surgical history on file.  PMH: No family history on file.  Social History:  reports that she has never smoked. She does not have any smokeless tobacco history on file. She reports that she does not drink alcohol or use illicit drugs.  Allergies: No Known Allergies  Medications: I have reviewed the patient's current medications.  Blood pressure 134/82, pulse 88, temperature 97.8 F (36.6 C), temperature source Oral, resp. rate 23, last menstrual period 07/29/2011, SpO2 100.00%.  Neurological exam: AAO*3. No aphasia.  Was able to tell me months of the year forwards and backwards correctly, exhibiting good attention span. Followed complex commands. Cranial nerves: EOMI, PERRL. Visual fields were full. Sensation to V1 through V3 areas of the face was intact and symmetric throughout. There was no facial asymmetry. Hearing to finger rub was equal and symmetrical bilaterally. Shoulder shrug was 5/5 and symmetric bilaterally. Head rotation was 5/5 bilaterally. There was no dysarthria or palatal deviation. Motor: strength was 5/5 and symmetric throughout, except that left proximal musculature was pain-limited. Sensory: was intact throughout to light touch, pinprick. Coordination: finger-to-nose intact and  symmetric bilaterally. Reflexes: were 2+ in upper extremities and 2+ at the knees and 2+ at the ankles. Plantar response was downgoing bilaterally. Gait: There was no ataxia noted.  Assessment/Plan: 30 years old woman who has had recurrent seizures despite being on Keppra 750 mg PO am and 500 mg PO qhs. She has had a normal EEG in the past and CT head, but per reports she has not had an MRI brain due to her girth.  1) Given recurrent ED visits, I would like to keep her overnight and try to make her a follow-up with outpatient neurology. I have explained my concerns regarding recurrent seizures to the patient, but she appears to want to go home today. She may sign out AMA. 2) Please give her 1250 mg of Keppra now and increase her dose to 1000 mg PO bid 3) I have her information and will call outpatient neurology to try to make an appointment for tomorrow, but if not possible, for early next week. 4) X-ray of left shoulder 5) She could benefit from stand up MRI, which may allow her to be scanned despite her girth. If she is still here tomorrow, I will explore the the in-hospital MRI issue. If she leaves, I will discuss it with outpatient neurology.   [name] [name] 09/10/2011, 5:05 PM     I spoke with Dr. Lyman Speller over the phone who was unable to addend this note at this time.  He states that he notified Ms. Massar that she cannot drive at this time due to the seizure and that she should avoid activities which may result  in injury if she were to have a seizure during these events.  She and her husband expressed understanding of this.  Lajuana Carry MD 09/10/2011, 8:56 PM

## 2011-09-10 NOTE — ED Notes (Signed)
Pt refusing to have labs drawn, also refusing suture care. Provider at the bedside.

## 2011-09-10 NOTE — ED Notes (Signed)
Pt leaving AMA. Follow up instructions explained to patient and family. Pt had no further questions. Vital signs stable. No signs of distress at the time of discharge.

## 2011-09-10 NOTE — ED Notes (Signed)
Pt presents to department via [facility] from home for evaluation of seizure. Pt had witnessed seizure today at home, son called ambulance. Pt was found face down, laceration to R eyebrow. Per son seizure lasted approximately 3-87minutes. Pt does take keppra at home. States she had seizure last week and was evaluated at hospital. She is alert and oriented x4 upon arrival. Pt states pain to L arm. No obvious deformities noted. Pt refused IV access per EMS.

## 2011-09-10 NOTE — ED Notes (Signed)
Pt presents to department for evaluation of seizure. Pt had witnessed seizure today at home lasting approximately 3-4 minutes. Pt was found face down on floor, states when she fell her glasses broke, laceration noted above R eyebrow area. Pt states soreness and pain to L arm, no deformity noted. Pt conscious alert and oriented x4 upon arrival. Able to transfer from EMS stretcher to bed without difficulty. Pt states she recently had seizure last week and was admitted to hospital. States she has been taking keppra at home. Able to move all extremities. No neurological deficits noted. No signs of distress noted at the time.

## 2011-09-10 NOTE — ED Notes (Signed)
Neurology at the bedside. Pt remains alert and oriented x4. Vital signs stable.

## 2011-09-11 NOTE — ED Provider Notes (Signed)
Medical screening examination/treatment/procedure(s) were performed by non-physician practitioner and as supervising physician I was immediately available for consultation/collaboration.  Geoffery Lyons, MD 09/11/11 [phone]

## 2011-09-19 ENCOUNTER — Encounter (HOSPITAL_COMMUNITY): Payer: Self-pay | Admitting: Emergency Medicine

## 2011-09-19 ENCOUNTER — Emergency Department (HOSPITAL_COMMUNITY)
Admission: EM | Admit: 2011-09-19 | Discharge: 2011-09-19 | Disposition: A | Discharge status: Home / Self Care | Payer: Medicare Other | Attending: Emergency Medicine | Admitting: Emergency Medicine

## 2011-09-19 DIAGNOSIS — E669 Obesity, unspecified: Secondary | ICD-10-CM | POA: Insufficient documentation

## 2011-09-19 DIAGNOSIS — G40909 Epilepsy, unspecified, not intractable, without status epilepticus: Secondary | ICD-10-CM | POA: Insufficient documentation

## 2011-09-19 DIAGNOSIS — IMO0001 Reserved for inherently not codable concepts without codable children: Secondary | ICD-10-CM | POA: Insufficient documentation

## 2011-09-19 DIAGNOSIS — R109 Unspecified abdominal pain: Secondary | ICD-10-CM | POA: Insufficient documentation

## 2011-09-19 DIAGNOSIS — R51 Headache: Secondary | ICD-10-CM

## 2011-09-19 DIAGNOSIS — F3189 Other bipolar disorder: Secondary | ICD-10-CM | POA: Insufficient documentation

## 2011-09-19 LAB — POCT I-STAT, CHEM 8
BUN: 10 mg/dL (ref 6–23)
Chloride: 106 mEq/L (ref 96–112)
HCT: 39 % (ref 36.0–46.0)
Sodium: 139 mEq/L (ref 135–145)
TCO2: 23 mmol/L (ref 0–100)

## 2011-09-19 LAB — URINALYSIS, ROUTINE W REFLEX MICROSCOPIC
Bilirubin Urine: NEGATIVE
Glucose, UA: NEGATIVE mg/dL
Hgb urine dipstick: NEGATIVE
Ketones, ur: NEGATIVE mg/dL
Protein, ur: NEGATIVE mg/dL
pH: 7.5 (ref 5.0–8.0)

## 2011-09-19 LAB — POCT PREGNANCY, URINE: Preg Test, Ur: NEGATIVE

## 2011-09-19 MED ORDER — ACETAMINOPHEN 500 MG PO TABS
1000.0000 mg | ORAL_TABLET | ORAL | Status: AC
  Administered 2011-09-19: 1000 mg via ORAL
  Filled 2011-09-19: qty 2

## 2011-09-19 MED ORDER — ACETAMINOPHEN 325 MG PO TABS
ORAL_TABLET | ORAL | Status: AC
  Filled 2011-09-19: qty 3

## 2011-09-19 NOTE — ED Notes (Addendum)
Patient complaining of seizures of the last couple of months.  Patient reporting headache, dizziness, nausea, cough, body cramps/aches/itchiness, and weakness starting today.  Denies having a seizure today, but states that she feels like she might have one.  Patient does not know if she is pregnant or not; concerned about having an STD; states that her abdomen and uterus is cramping.  Patient reports that she has not been using protection during sexual activity.  Last period 08/31/11.  Patient reports urinary frequency, but denies any other urinary changes.  Denies any vaginal changes.  Patient requesting to have pelvic exam done -- states that the symptoms that she is experiencing are exactly the same as the symptoms during her last pregnancy (9 years ago).  Upon arrival to room, patient connected to continuous cardiac, pulse ox, and blood pressure monitor.  Will continue to monitor.

## 2011-09-19 NOTE — ED Notes (Signed)
Lab at bedside

## 2011-09-19 NOTE — ED Notes (Signed)
Nurse tech assisted patient to restroom to collect urine specimen.

## 2011-09-19 NOTE — ED Notes (Signed)
Patient currently sitting up in bed; no respiratory or acute distress noted.  Boyfriend at bedside.  Will continue to monitor.

## 2011-09-19 NOTE — ED Notes (Signed)
Patient currently sitting up in bed; no respiratory or acute distress noted.  Boyfriend present at bedside.  Patient has no questions or concerns at this time.  Will continue to monitor.

## 2011-09-19 NOTE — ED Notes (Signed)
Patient given discharge paperwork; went over discharge instructions with patient.  Patient instructed to take Tylenol for pain, to follow up with a primary care physician or the health department if interested in further STD testing.  Instructed to return to the ED for new, worsening, or concerning symptoms.

## 2011-09-19 NOTE — ED Notes (Signed)
Patient currently sitting up in bed; no respiratory or acute distress noted.  Asked patient to change into gown.  Updated patient on plan of care; informed patient that we are waiting on EDP to come and assess patient.  Patient has no other questions or concerns at this time.  Will continue to monitor.

## 2011-09-19 NOTE — ED Notes (Signed)
Per EMS, patient has a history of seizures.  Today, patient complaining of a "weird feeling" all over, nausea, and a headache.  Patient reports not using protection during sexual activity lately -- states "I thought I was too big to get pregnant"; last pregnancy 9 years ago.  Patient ambulatory to room.

## 2011-09-19 NOTE — ED Provider Notes (Signed)
History     CSN: [id number] Arrival date & time: 09/19/2011 12:29 AM   First MD Initiated Contact with Patient 09/19/11 0105      Chief Complaint  Patient presents with  . Abdominal Cramping    (Consider location/radiation/quality/duration/timing/severity/associated sxs/prior treatment) HPI Complains of headache diffuse cramping in muscles arms and legs and abdomen onset 24 hours ago intermittent not made better or worse by anything she is presently asymptomatic denies nausea or vomiting denies fever. Concerned she might be pregnant or have an STD,, as she had unprotected sex several days ago. Denies vaginal discharge no treatment prior to coming here. Cramping feeling in extremities and abdomen intermittent lasting several hours at a time. No cramping now presently only complaint is diffuse headache. Past Medical History  Diagnosis Date  . Seizures   . Bipolar affective disorder     History reviewed. No pertinent past surgical history.  History reviewed. No pertinent family history.  History  Substance Use Topics  . Smoking status: Never Smoker   . Smokeless tobacco: Not on file  . Alcohol Use: No    OB History    Grav Para Term Preterm Abortions TAB SAB Ect Mult Living                  Review of Systems  Constitutional: Negative.   HENT: Negative.   Respiratory: Negative.   Cardiovascular: Negative.   Gastrointestinal: Positive for abdominal pain.  Musculoskeletal: Positive for myalgias.  Skin: Negative.   Neurological: Negative.        Headache  Hematological: Negative.   Psychiatric/Behavioral: Negative.     Allergies  Review of patient's allergies indicates no known allergies.  Home Medications   Current Outpatient Rx  Name Route Sig Dispense Refill  . HYDROCODONE-ACETAMINOPHEN 5-325 MG PO TABS Oral Take 2 tablets by mouth every 4 (four) hours as needed. For pain     . IBUPROFEN 200 MG PO TABS Oral Take 600 mg by mouth every 6 (six) hours as needed.  For pain.     Marland Kitchen LEVETIRACETAM 500 MG PO TABS Oral Take 1,500 mg by mouth every 12 (twelve) hours.        BP 134/102  Pulse 106  Temp(Src) 98.1 F (36.7 C) (Oral)  Resp 15  SpO2 100%  LMP 08/30/2011  Physical Exam  Nursing note and vitals reviewed. Constitutional: She is oriented to person, place, and time. She appears well-developed and well-nourished.  HENT:  Head: Normocephalic and atraumatic.  Eyes: Conjunctivae are normal. Pupils are equal, round, and reactive to light.  Neck: Neck supple. No tracheal deviation present. No thyromegaly present.  Cardiovascular: Normal rate and regular rhythm.   No murmur heard. Pulmonary/Chest: Effort normal and breath sounds normal.  Abdominal: Soft. Bowel sounds are normal. She exhibits no distension. There is no tenderness.       obese  Musculoskeletal: Normal range of motion. She exhibits no edema and no tenderness.  Neurological: She is alert and oriented to person, place, and time. No cranial nerve deficit. She exhibits normal muscle tone. Coordination normal.  Skin: Skin is warm and dry. No rash noted.  Psychiatric: She has a normal mood and affect.    ED Course  Procedures (including critical care time) 3:15 AM headache is improved patient is alert Glasgow Coma Score 15   Labs Reviewed  URINALYSIS, ROUTINE W REFLEX MICROSCOPIC  POCT PREGNANCY, URINE   No results found.   No diagnosis found. Results for orders placed during the hospital  encounter of 09/19/11  URINALYSIS, ROUTINE W REFLEX MICROSCOPIC      Component Value Range   Color, Urine YELLOW  YELLOW    APPearance CLOUDY (*) CLEAR    Specific Gravity, Urine 1.019  1.005 - 1.030    pH 7.5  5.0 - 8.0    Glucose, UA NEGATIVE  NEGATIVE (mg/dL)   Hgb urine dipstick NEGATIVE  NEGATIVE    Bilirubin Urine NEGATIVE  NEGATIVE    Ketones, ur NEGATIVE  NEGATIVE (mg/dL)   Protein, ur NEGATIVE  NEGATIVE (mg/dL)   Urobilinogen, UA 0.2  0.0 - 1.0 (mg/dL)   Nitrite NEGATIVE   NEGATIVE    Leukocytes, UA NEGATIVE  NEGATIVE   POCT I-STAT, CHEM 8      Component Value Range   Sodium 139  135 - 145 (mEq/L)   Potassium 4.2  3.5 - 5.1 (mEq/L)   Chloride 106  96 - 112 (mEq/L)   BUN 10  6 - 23 (mg/dL)   Creatinine, Ser 1.61  0.50 - 1.10 (mg/dL)   Glucose, Bld 096 (*) 70 - 99 (mg/dL)   Calcium, Ion 0.45  4.09 - 1.32 (mmol/L)   TCO2 23  0 - 100 (mmol/L)   Hemoglobin 13.3  12.0 - 15.0 (g/dL)   HCT 81.1  91.4 - 78.2 (%)  POCT PREGNANCY, URINE      Component Value Range   Preg Test, Ur NEGATIVE     Dg Shoulder Left  09/10/2011  *RADIOLOGY REPORT*  Clinical Data: Seizure.  Fell and injured left shoulder.  LEFT SHOULDER - 2+ VIEW 09/10/2011:  Comparison: None.  Findings: Mild inferior glenohumeral subluxation, which may be related to a joint effusion.  No evidence of frank glenohumeral dislocation.  No acute fractures.  Acromioclavicular joint intact.  IMPRESSION: Mild inferior glenohumeral subluxation, possibly related to shoulder joint effusion.  No evidence of frank glenohumeral dislocation or acute fractures.  Original Report Authenticated By: Arnell Sieving, M.D.      MDM  Symptoms felt to be nonspecific. Patient exhibits no evidence of STD based on history or symptoms. Plan Tylenol for pain referral [facility]. She is encouraged to practice safe sex Diagnosis #1 nonspecific headache #2 myalgias #3 elevated blood pressure        Doug Sou, MD 09/19/11 [phone]

## 2011-09-30 ENCOUNTER — Encounter (HOSPITAL_COMMUNITY): Payer: Self-pay | Admitting: Emergency Medicine

## 2011-09-30 ENCOUNTER — Emergency Department (HOSPITAL_COMMUNITY)
Admission: EM | Admit: 2011-09-30 | Discharge: 2011-10-01 | Disposition: A | Discharge status: Home / Self Care | Payer: Medicare Other | Attending: Emergency Medicine | Admitting: Emergency Medicine

## 2011-09-30 DIAGNOSIS — S01502A Unspecified open wound of oral cavity, initial encounter: Secondary | ICD-10-CM | POA: Insufficient documentation

## 2011-09-30 DIAGNOSIS — F29 Unspecified psychosis not due to a substance or known physiological condition: Secondary | ICD-10-CM | POA: Insufficient documentation

## 2011-09-30 DIAGNOSIS — S92109A Unspecified fracture of unspecified talus, initial encounter for closed fracture: Secondary | ICD-10-CM | POA: Insufficient documentation

## 2011-09-30 DIAGNOSIS — F319 Bipolar disorder, unspecified: Secondary | ICD-10-CM | POA: Insufficient documentation

## 2011-09-30 DIAGNOSIS — W503XXA Accidental bite by another person, initial encounter: Secondary | ICD-10-CM | POA: Insufficient documentation

## 2011-09-30 DIAGNOSIS — M25476 Effusion, unspecified foot: Secondary | ICD-10-CM | POA: Insufficient documentation

## 2011-09-30 DIAGNOSIS — R569 Unspecified convulsions: Secondary | ICD-10-CM

## 2011-09-30 DIAGNOSIS — S92102A Unspecified fracture of left talus, initial encounter for closed fracture: Secondary | ICD-10-CM

## 2011-09-30 DIAGNOSIS — G40909 Epilepsy, unspecified, not intractable, without status epilepticus: Secondary | ICD-10-CM | POA: Insufficient documentation

## 2011-09-30 DIAGNOSIS — R404 Transient alteration of awareness: Secondary | ICD-10-CM | POA: Insufficient documentation

## 2011-09-30 DIAGNOSIS — R51 Headache: Secondary | ICD-10-CM | POA: Insufficient documentation

## 2011-09-30 DIAGNOSIS — S8990XA Unspecified injury of unspecified lower leg, initial encounter: Secondary | ICD-10-CM | POA: Insufficient documentation

## 2011-09-30 DIAGNOSIS — Z79899 Other long term (current) drug therapy: Secondary | ICD-10-CM | POA: Insufficient documentation

## 2011-09-30 DIAGNOSIS — M25473 Effusion, unspecified ankle: Secondary | ICD-10-CM | POA: Insufficient documentation

## 2011-09-30 LAB — URINALYSIS, ROUTINE W REFLEX MICROSCOPIC
Bilirubin Urine: NEGATIVE
Nitrite: NEGATIVE
Protein, ur: NEGATIVE mg/dL
Specific Gravity, Urine: 1.026 (ref 1.005–1.030)
Urobilinogen, UA: 0.2 mg/dL (ref 0.0–1.0)

## 2011-09-30 LAB — PREGNANCY, URINE: Preg Test, Ur: NEGATIVE

## 2011-09-30 NOTE — ED Notes (Signed)
Patient with seizure, witnessed by boyfriend. Lasting approx 5 minutes.   Postical upon EMS arrival, now CAOx3.  4 days late from period.

## 2011-09-30 NOTE — ED Notes (Signed)
Pt states that she has been having seizures ever since sug pt sees a neurologist for seizures with tests done to figure out why she has a seizures with no explainantion. Pt states that she has a history of migraines and is out of her vicoden for her HA. Pt states that she ate dinner, went to a gambling place and then does not remember anything else. Pt alert and oriented now with no neuro deficits. Pt not incontinent.

## 2011-09-30 NOTE — ED Notes (Signed)
Now complaining of right ankle pain.

## 2011-10-01 ENCOUNTER — Emergency Department (HOSPITAL_COMMUNITY): Payer: Medicare Other

## 2011-10-01 LAB — POCT I-STAT, CHEM 8
Creatinine, Ser: 0.6 mg/dL (ref 0.50–1.10)
Glucose, Bld: 153 mg/dL — ABNORMAL HIGH (ref 70–99)
HCT: 36 % (ref 36.0–46.0)
Hemoglobin: 12.2 g/dL (ref 12.0–15.0)
Potassium: 3.9 mEq/L (ref 3.5–5.1)
TCO2: 23 mmol/L (ref 0–100)

## 2011-10-01 MED ORDER — SODIUM CHLORIDE 0.9 % IV SOLN: 1000.0000 mg | Freq: Once | INTRAVENOUS | Status: DC

## 2011-10-01 MED ORDER — HYDROCODONE-ACETAMINOPHEN 5-500 MG PO TABS: 1.0000 | ORAL_TABLET | Freq: Four times a day (QID) | ORAL | Status: AC | PRN

## 2011-10-01 MED ORDER — LEVETIRACETAM 750 MG PO TABS
750.0000 mg | ORAL_TABLET | ORAL | Status: AC
  Administered 2011-10-01: 750 mg via ORAL
  Filled 2011-10-01: qty 1

## 2011-10-01 NOTE — ED Notes (Signed)
Ortho at bedside apply splint

## 2011-10-01 NOTE — ED Provider Notes (Signed)
History     CSN: [id number] Arrival date & time: 09/30/2011  9:41 PM   First MD Initiated Contact with Patient 09/30/11 2256      Chief Complaint  Patient presents with  . Seizures    (Consider location/radiation/quality/duration/timing/severity/associated sxs/prior treatment) Patient is a 30 y.o. female presenting with seizures. The history is provided by the patient and a significant other.  Seizures  This is a recurrent problem. The current episode started less than 1 hour ago. The problem has been gradually improving. There was 1 seizure. The most recent episode lasted 2 to 5 minutes. Associated symptoms include confusion and headaches. Pertinent negatives include no sore throat, no chest pain, no nausea and no vomiting. Characteristics include rhythmic jerking, loss of consciousness and bit tongue. Characteristics do not include bowel incontinence or cyanosis. The episode was witnessed. There was the sensation of an aura present. The seizures did not continue in the ED. The seizure(s) had no focality. Possible causes include med or dosage change. Possible causes do not include sleep deprivation, missed seizure meds, recent illness or change in alcohol use. There has been no fever. There were no medications administered prior to arrival.   in addition to tongue biting, patient also injured her left ankle. A seizure witnessed by significant other who is bedside states she did not fall or have any obvious injury but now has swelling and pain to her left ankle. No weakness or numbness. Her seizures started a few months ago, she recently had an MRI and is actively followed by neurology. Moderate in severity. Pain is sharp in quality and is not radiating.  Past Medical History  Diagnosis Date  . Seizures   . Bipolar affective disorder     History reviewed. No pertinent past surgical history.  No family history on file.  History  Substance Use Topics  . Smoking status: Never Smoker   .  Smokeless tobacco: Not on file  . Alcohol Use: No    OB History    Grav Para Term Preterm Abortions TAB SAB Ect Mult Living                  Review of Systems  Constitutional: Negative for fever and chills.  HENT: Negative for sore throat, neck pain and neck stiffness.   Eyes: Negative for pain.  Respiratory: Negative for shortness of breath, wheezing and stridor.   Cardiovascular: Negative for chest pain, palpitations and cyanosis.  Gastrointestinal: Negative for nausea, vomiting, abdominal pain and bowel incontinence.  Genitourinary: Negative for dysuria and flank pain.  Musculoskeletal: Negative for back pain.  Skin: Negative for rash.  Neurological: Positive for seizures, loss of consciousness and headaches.  Psychiatric/Behavioral: Positive for confusion.  All other systems reviewed and are negative.    Allergies  Review of patient's allergies indicates no known allergies.  Home Medications   Current Outpatient Rx  Name Route Sig Dispense Refill  . HYDROCODONE-ACETAMINOPHEN 5-325 MG PO TABS Oral Take 1 tablet by mouth every 6 (six) hours as needed. pain     . IBUPROFEN 200 MG PO TABS Oral Take 600 mg by mouth every 6 (six) hours as needed. For pain.     Marland Kitchen LEVETIRACETAM 750 MG PO TABS Oral Take 1,500 mg by mouth every 12 (twelve) hours.      . TOPIRAMATE 50 MG PO TABS Oral Take 50 mg by mouth at bedtime. Take 1 tablet at bedtime beginning 09/15/11, for 2 weeks, then advance to 1 tablet twice daily  BP 140/87  Pulse 98  Temp(Src) 98.4 F (36.9 C) (Oral)  Resp 18  SpO2 100%  LMP 08/27/2011  Physical Exam  Constitutional: She is oriented to person, place, and time. She appears well-developed and well-nourished.  HENT:  Head: Normocephalic and atraumatic.       Mild tongue laceration no active bleeding  Eyes: Conjunctivae and EOM are normal. Pupils are equal, round, and reactive to light.  Neck: Full passive range of motion without pain. Neck supple. No  thyromegaly present.       No meningismus  Cardiovascular: Normal rate, regular rhythm, S1 normal, S2 normal and intact distal pulses.   Pulmonary/Chest: Effort normal and breath sounds normal.  Abdominal: Soft. Bowel sounds are normal. There is no tenderness. There is no rebound, no guarding and no CVA tenderness.  Musculoskeletal: Normal range of motion.       Left lower extremity: Tenderness and swelling to lateral malleolus with skin intact. Distal neurovascular is intact with equal dorsalis pedis pulses. Nontender proximal fibula, knee and hip.  Neurological: She is alert and oriented to person, place, and time. She has normal strength and normal reflexes. No cranial nerve deficit or sensory deficit. She displays a negative Romberg sign. GCS eye subscore is 4. GCS verbal subscore is 5. GCS motor subscore is 6.       Normal Gait  Skin: Skin is warm and dry. No rash noted. No cyanosis. Nails show no clubbing.  Psychiatric: She has a normal mood and affect. Her speech is normal and behavior is normal.    ED Course  Procedures (including critical care time)  Labs Reviewed  URINALYSIS, ROUTINE W REFLEX MICROSCOPIC - Abnormal; Notable for the following:    APPearance CLOUDY (*)    All other components within normal limits  POCT I-STAT, CHEM 8 - Abnormal; Notable for the following:    Glucose, Bld 153 (*)    All other components within normal limits  PREGNANCY, URINE  I-STAT, CHEM 8   Dg Ankle Complete Left  10/01/2011  *RADIOLOGY REPORT*  Clinical Data: Fall after seizure.  Lateral ankle pain.  LEFT ANKLE COMPLETE - 3+ VIEW  Comparison: None.  Findings: There is a focal osteochondral defect in the medial aspect of the talar dome with displaced intra-articular fragment. Hypertrophic changes on the tibia.  No evidence of acute fracture or subluxation.  No focal expansile or destructive bone lesion.  No abnormal radiopaque densities in the soft tissues.  IMPRESSION: Osteochondral lesion in  the medial tibial plateau with displaced intra-articular fragment.  Original Report Authenticated By: Marlon Pel, M.D.   Left Talus fracture as above, x-ray reviewed by Dr. Dion Saucier on call for orthopedics who recommends Cam boot, walker and followup in the clinic.   No further seizure activity in the emergency department. Patient assures me that she's been taking all her medications as prescribed, she denies any sleep deprivation, she denies any recent alcohol or drug use and the only change in her medications was an increase in her keppra recently. Her last seizure was about 2 weeks ago.   MDM  X-ray labs and patient's Keppra provided. Fracture care as above with orthopedics followup. Pain medications provided. Patient has close neurological followup for seizures and has medications at home as prescribed. Symptoms improved in the emergency department and has no further seizure activity, no meningismus, no fever and vital signs reviewed. No indication for admission at this time or further workup.  Sunnie Nielsen, MD 10/01/11 [phone]

## 2011-10-11 ENCOUNTER — Encounter (HOSPITAL_COMMUNITY): Payer: Self-pay

## 2011-10-11 ENCOUNTER — Emergency Department (HOSPITAL_COMMUNITY): Payer: Medicare Other

## 2011-10-11 ENCOUNTER — Emergency Department (HOSPITAL_COMMUNITY)
Admission: EM | Admit: 2011-10-11 | Discharge: 2011-10-11 | Disposition: A | Discharge status: Home / Self Care | Payer: Medicare Other | Attending: Emergency Medicine | Admitting: Emergency Medicine

## 2011-10-11 ENCOUNTER — Other Ambulatory Visit (HOSPITAL_COMMUNITY): Payer: Self-pay

## 2011-10-11 DIAGNOSIS — G40909 Epilepsy, unspecified, not intractable, without status epilepticus: Secondary | ICD-10-CM

## 2011-10-11 DIAGNOSIS — W1809XA Striking against other object with subsequent fall, initial encounter: Secondary | ICD-10-CM | POA: Insufficient documentation

## 2011-10-11 DIAGNOSIS — S01502A Unspecified open wound of oral cavity, initial encounter: Secondary | ICD-10-CM | POA: Insufficient documentation

## 2011-10-11 DIAGNOSIS — IMO0002 Reserved for concepts with insufficient information to code with codable children: Secondary | ICD-10-CM | POA: Insufficient documentation

## 2011-10-11 DIAGNOSIS — R569 Unspecified convulsions: Secondary | ICD-10-CM | POA: Insufficient documentation

## 2011-10-11 DIAGNOSIS — R51 Headache: Secondary | ICD-10-CM | POA: Insufficient documentation

## 2011-10-11 DIAGNOSIS — F319 Bipolar disorder, unspecified: Secondary | ICD-10-CM | POA: Insufficient documentation

## 2011-10-11 MED ORDER — HYDROCODONE-ACETAMINOPHEN 5-325 MG PO TABS
1.0000 | ORAL_TABLET | Freq: Once | ORAL | Status: AC
  Administered 2011-10-11: 1 via ORAL
  Filled 2011-10-11: qty 1

## 2011-10-11 MED ORDER — LEVETIRACETAM 500 MG PO TABS
500.0000 mg | ORAL_TABLET | Freq: Once | ORAL | Status: AC
  Administered 2011-10-11: 500 mg via ORAL
  Filled 2011-10-11: qty 1

## 2011-10-11 NOTE — ED Notes (Signed)
Per pts son she had a seizure this evening at home and fell where her head was under the bed and she was bleeding but he didn't know from where. Pt doesn't currently have blood on her.

## 2011-10-11 NOTE — ED Notes (Signed)
Pt states she hit her head when had seizure today.  Pt has slight bump to left forehead.  Seizure witnessed by son.  Has had 9 seizures since August.  On meds.

## 2011-10-11 NOTE — ED Provider Notes (Signed)
History     CSN: [id number]  Arrival date & time 10/11/11  1627   First MD Initiated Contact with Patient 10/11/11 2000      Chief Complaint  Patient presents with  . Seizures    (Consider location/radiation/quality/duration/timing/severity/associated sxs/prior treatment) HPI Comments: Patient here with son who reports that his mother had a seizure - he states she was getting ready to put her clothes on, sat on the chair and then fell forward striking her right forehead and had a seizure like her typical lasting about "15 minutes" - she had a short post-ictal period and arrives here alert and oriented, complaining of a headache - according to her son, he noticed blood coming out of her mouth as well, she states she thinks that she bit her tongue and has a sore place to the left lateral border.  She reports seizures just started in September of this year.  Patient is a 30 y.o. female presenting with seizures. The history is provided by the patient and a relative. No language interpreter was used.  Seizures  This is a recurrent problem. The current episode started 3 to 5 hours ago. The problem has not changed since onset.There was 1 seizure. The most recent episode lasted more than 5 minutes. Associated symptoms include sleepiness and headaches. Pertinent negatives include no confusion, no speech difficulty, no visual disturbance, no neck stiffness, no sore throat, no chest pain, no cough, no nausea, no vomiting, no diarrhea and no muscle weakness. Characteristics include eye blinking, rhythmic jerking and bit tongue. Characteristics do not include eye deviation, bowel incontinence, bladder incontinence, loss of consciousness, apnea or cyanosis. The episode was witnessed. There was no sensation of an aura present. The seizures did not continue in the ED. The seizure(s) had no focality. Possible causes do not include med or dosage change, sleep deprivation, missed seizure meds, recent illness or  change in alcohol use. There has been no fever. There were no medications administered prior to arrival.    Past Medical History  Diagnosis Date  . Seizures   . Bipolar affective disorder   . Seizure     History reviewed. No pertinent past surgical history.  History reviewed. No pertinent family history.  History  Substance Use Topics  . Smoking status: Never Smoker   . Smokeless tobacco: Not on file  . Alcohol Use: No    OB History    Grav Para Term Preterm Abortions TAB SAB Ect Mult Living                  Review of Systems  HENT: Negative for sore throat.   Eyes: Negative for visual disturbance.  Respiratory: Negative for apnea and cough.   Cardiovascular: Negative for chest pain and cyanosis.  Gastrointestinal: Negative for nausea, vomiting, diarrhea and bowel incontinence.  Genitourinary: Negative for bladder incontinence.  Neurological: Positive for seizures and headaches. Negative for loss of consciousness and speech difficulty.  Psychiatric/Behavioral: Negative for confusion.  All other systems reviewed and are negative.    Allergies  Review of patient's allergies indicates no known allergies.  Home Medications   Current Outpatient Rx  Name Route Sig Dispense Refill  . HYDROCODONE-ACETAMINOPHEN 5-500 MG PO TABS Oral Take 1-2 tablets by mouth every 6 (six) hours as needed for pain. 15 tablet 0  . IBUPROFEN 200 MG PO TABS Oral Take 600 mg by mouth every 6 (six) hours as needed. For pain.     Marland Kitchen LEVETIRACETAM 750 MG PO TABS Oral  Take 1,500 mg by mouth every 12 (twelve) hours.      . TOPIRAMATE 50 MG PO TABS Oral Take 50 mg by mouth at bedtime. Take 1 tablet at bedtime beginning 09/15/11, for 2 weeks, then advance to 1 tablet twice daily       BP 141/87  Pulse 91  Temp(Src) 98.7 F (37.1 C) (Axillary)  Resp 20  SpO2 98%  LMP 08/27/2011  Physical Exam  Nursing note and vitals reviewed. Constitutional: She is oriented to person, place, and time. She  appears well-developed and well-nourished. No distress.  HENT:  Head: Normocephalic.    Right Ear: External ear normal.  Left Ear: External ear normal.  Nose: Nose normal.  Mouth/Throat: Oropharynx is clear and moist. No oropharyngeal exudate.    Eyes: Conjunctivae are normal. Pupils are equal, round, and reactive to light. No scleral icterus.  Neck: Normal range of motion. Neck supple.  Cardiovascular: Normal rate, regular rhythm and normal heart sounds.  Exam reveals no gallop and no friction rub.   No murmur heard. Pulmonary/Chest: Effort normal and breath sounds normal. She exhibits no tenderness.  Abdominal: Soft. Bowel sounds are normal. She exhibits no distension. There is no tenderness.  Musculoskeletal: Normal range of motion. She exhibits no edema and no tenderness.  Lymphadenopathy:    She has no cervical adenopathy.  Neurological: She is alert and oriented to person, place, and time. She displays normal reflexes. No cranial nerve deficit. She exhibits normal muscle tone. Coordination normal.  Skin: Skin is warm and dry.  Psychiatric: She has a normal mood and affect. Her behavior is normal. Judgment and thought content normal.    ED Course  Procedures (including critical care time)  Labs Reviewed - No data to display No results found. Results for orders placed during the hospital encounter of 09/30/11  PREGNANCY, URINE      Component Value Range   Preg Test, Ur NEGATIVE    URINALYSIS, ROUTINE W REFLEX MICROSCOPIC      Component Value Range   Color, Urine YELLOW  YELLOW    APPearance CLOUDY (*) CLEAR    Specific Gravity, Urine 1.026  1.005 - 1.030    pH 5.5  5.0 - 8.0    Glucose, UA NEGATIVE  NEGATIVE (mg/dL)   Hgb urine dipstick NEGATIVE  NEGATIVE    Bilirubin Urine NEGATIVE  NEGATIVE    Ketones, ur NEGATIVE  NEGATIVE (mg/dL)   Protein, ur NEGATIVE  NEGATIVE (mg/dL)   Urobilinogen, UA 0.2  0.0 - 1.0 (mg/dL)   Nitrite NEGATIVE  NEGATIVE    Leukocytes, UA  NEGATIVE  NEGATIVE   POCT I-STAT, CHEM 8      Component Value Range   Sodium 142  135 - 145 (mEq/L)   Potassium 3.9  3.5 - 5.1 (mEq/L)   Chloride 108  96 - 112 (mEq/L)   BUN 8  6 - 23 (mg/dL)   Creatinine, Ser 1.61  0.50 - 1.10 (mg/dL)   Glucose, Bld 096 (*) 70 - 99 (mg/dL)   Calcium, Ion 0.45  4.09 - 1.32 (mmol/L)   TCO2 23  0 - 100 (mmol/L)   Hemoglobin 12.2  12.0 - 15.0 (g/dL)   HCT 81.1  91.4 - 78.2 (%)   Dg Ankle Complete Left  10/01/2011  *RADIOLOGY REPORT*  Clinical Data: Fall after seizure.  Lateral ankle pain.  LEFT ANKLE COMPLETE - 3+ VIEW  Comparison: None.  Findings: There is a focal osteochondral defect in the medial aspect of the  talar dome with displaced intra-articular fragment. Hypertrophic changes on the tibia.  No evidence of acute fracture or subluxation.  No focal expansile or destructive bone lesion.  No abnormal radiopaque densities in the soft tissues.  IMPRESSION: Osteochondral lesion in the medial tibial plateau with displaced intra-articular fragment.  Original Report Authenticated By: Marlon Pel, M.D.   Ct Head Wo Contrast  10/11/2011  *RADIOLOGY REPORT*  Clinical Data:  Seizure, fall, head injury, struck left side of head  CT HEAD WITHOUT CONTRAST  Technique:  Contiguous axial images were obtained from the base of the skull through the vertex without contrast.  Comparison: 07/22/2011  Findings: Normal ventricular morphology. No midline shift or mass effect. Normal appearance of brain parenchyma. No intracranial hemorrhage, mass lesion, or acute infarction. Visualized paranasal sinuses and mastoid air cells clear. Bones unremarkable.  IMPRESSION: No acute intracranial abnormalities.  Original Report Authenticated By: Lollie Marrow, M.D.     Seizure with known seizure disorder  MDM  Pt's head CT without acute findings - patient feels better now - dosed with an addition 500mg  of keppra - will follow up with neurologist       Scarlette Calico C. [place],  [place] 10/11/11 2214

## 2011-10-11 NOTE — ED Provider Notes (Signed)
Medical screening examination/treatment/procedure(s) were performed by non-physician practitioner and as supervising physician I was immediately available for consultation/collaboration. Teyonna Plaisted, MD, FACEP   Ridhaan Dreibelbis L Catheryn Slifer, MD 10/11/11 2302 

## 2011-11-01 ENCOUNTER — Emergency Department (HOSPITAL_COMMUNITY)
Admission: EM | Admit: 2011-11-01 | Discharge: 2011-11-02 | Disposition: A | Discharge status: Home / Self Care | Payer: Medicare Other | Attending: Emergency Medicine | Admitting: Emergency Medicine

## 2011-11-01 DIAGNOSIS — R569 Unspecified convulsions: Secondary | ICD-10-CM

## 2011-11-01 NOTE — ED Notes (Signed)
Pt states she hit her head when had seizure today.  Pt has slight bump to left forehead.  Seizure witnessed by son.  Has had 9 seizures since August.  On meds. 

## 2011-11-01 NOTE — ED Notes (Signed)
Pt states she hit her head when had seizure today.  Pt has slight bump to left forehead.  Seizure not witnessed at this time.  Has had 10 seizures since August.  On meds. Pt further states that she had missed her appt with her PCP and now has to find new one. Pt c/o HA 4/10. Pt able to communicate,clear speech noted

## 2011-11-02 MED ORDER — LORAZEPAM 1 MG PO TABS
1.0000 mg | ORAL_TABLET | Freq: Once | ORAL | Status: AC
  Administered 2011-11-02: 1 mg via ORAL
  Filled 2011-11-02: qty 1

## 2011-11-02 MED ORDER — LEVETIRACETAM 1000 MG PO TABS: 2000.0000 mg | ORAL_TABLET | Freq: Two times a day (BID) | ORAL | Status: DC

## 2011-11-02 NOTE — ED Provider Notes (Signed)
History     CSN: [id number]  Arrival date & time 11/01/11  2306   First MD Initiated Contact with Patient 11/02/11 0304      Chief Complaint  Patient presents with  . Seizures    (Consider location/radiation/quality/duration/timing/severity/associated sxs/prior treatment) HPI Comments: 31 year old female with a history of seizures, bipolar disorder. She presents with a recurrent tonic-clonic type seizure. This was acute in onset, resolve spontaneously after several minutes, not associated with fever chills nausea vomiting headache stiff neck or blurred vision. No weakness or numbness. Initially had some postictal confusion but this has resolved at this time. She was sitting in a chair when it happened, states that she fell forward striking her head on an unknown object. She has no headache at this time.  She started having seizures in August of 2012, she had a head injury in 2010 that is suspected of causing the seizures. Since that time she has been placed on both Topamax and Keppra. The Keppra has had continual increasing dosing every time she has a seizure but she still continues to have a period in December she had 3, in January she has not had one. She is followed by [facility] - Dr. Danae Orleans.    Patient is a 31 y.o. female presenting with seizures. The history is provided by the patient and a relative.  Seizures     Past Medical History  Diagnosis Date  . Seizures   . Bipolar affective disorder   . Seizure     No past surgical history on file.  No family history on file.  History  Substance Use Topics  . Smoking status: Never Smoker   . Smokeless tobacco: Not on file  . Alcohol Use: No    OB History    Grav Para Term Preterm Abortions TAB SAB Ect Mult Living                  Review of Systems  Neurological: Positive for seizures.  All other systems reviewed and are negative.    Allergies  Review of patient's allergies indicates no known  allergies.  Home Medications   Current Outpatient Rx  Name Route Sig Dispense Refill  . IBUPROFEN 200 MG PO TABS Oral Take 600 mg by mouth every 6 (six) hours as needed. For pain.     Marland Kitchen LEVETIRACETAM 750 MG PO TABS Oral Take 1,500 mg by mouth every 12 (twelve) hours.     . TOPIRAMATE 50 MG PO TABS Oral Take 50 mg by mouth 2 (two) times daily.     Marland Kitchen LEVETIRACETAM 1000 MG PO TABS Oral Take 2 tablets (2,000 mg total) by mouth 2 (two) times daily. 60 tablet 0    BP 128/78  Pulse 89  Temp(Src) 98.5 F (36.9 C) (Oral)  Resp 20  Ht 5\' 1"  (1.549 m)  Wt 345 lb (156.491 kg)  BMI 65.19 kg/m2  SpO2 99%  LMP 09/28/2011  Physical Exam  Nursing note and vitals reviewed. Constitutional: She appears well-developed and well-nourished. No distress.  HENT:  Head: Normocephalic and atraumatic.  Mouth/Throat: Oropharynx is clear and moist. No oropharyngeal exudate.  Eyes: Conjunctivae and EOM are normal. Pupils are equal, round, and reactive to light. Right eye exhibits no discharge. Left eye exhibits no discharge. No scleral icterus.  Neck: Normal range of motion. Neck supple. No JVD present. No thyromegaly present.  Cardiovascular: Normal rate, regular rhythm, normal heart sounds and intact distal pulses.  Exam reveals no gallop and no friction  rub.   No murmur heard. Pulmonary/Chest: Effort normal and breath sounds normal. No respiratory distress. She has no wheezes. She has no rales.  Abdominal: Soft. Bowel sounds are normal. She exhibits no distension and no mass. There is no tenderness.  Musculoskeletal: Normal range of motion. She exhibits no edema and no tenderness.  Lymphadenopathy:    She has no cervical adenopathy.  Neurological: She is alert. Coordination normal.       Neurologic exam:  Speech clear, pupils equal round reactive to light, extraocular movements intact  Normal peripheral visual fields Cranial nerves III through XII normal including no facial droop Follows commands,  moves all extremities x4, normal strength to bilateral upper and lower extremities at all major muscle groups including grip Sensation normal to light touch and pinprick Coordination intact, no limb ataxia, finger-nose-finger normal Rapid alternating movements normal No pronator drift Gait normal   Skin: Skin is warm and dry. No rash noted. No erythema.  Psychiatric: She has a normal mood and affect. Her behavior is normal.    ED Course  Procedures (including critical care time)  Labs Reviewed - No data to display No results found.   1. Seizure       MDM  Recurrent tonic-clonic seizure, has returned to baseline, have talked with neurologist on call who has agreed with increasing Keppra dose to 2000 mg twice daily.  Ativan tablet by mouth given prior to discharge, ambulated without difficulty        Vida Roller, MD 11/02/11 [phone]

## 2011-11-02 NOTE — ED Notes (Signed)
Miller, MD at bedside.  

## 2011-11-02 NOTE — ED Notes (Signed)
Pt. Reports having a seizure last night at approx. 2200, un-witnessed that lasted approx. 5-10 minutes. Pt. CO headache and generalized body aches. Small bruise noted to right forehead. Pt. AOx3, answering questions appropriately at this time. PERRLA.

## 2011-11-14 ENCOUNTER — Emergency Department (HOSPITAL_COMMUNITY)
Admission: EM | Admit: 2011-11-14 | Discharge: 2011-11-15 | Disposition: A | Discharge status: Home / Self Care | Payer: Medicare Other | Attending: Emergency Medicine | Admitting: Emergency Medicine

## 2011-11-14 ENCOUNTER — Encounter (HOSPITAL_COMMUNITY): Payer: Self-pay | Admitting: *Deleted

## 2011-11-14 DIAGNOSIS — R569 Unspecified convulsions: Secondary | ICD-10-CM

## 2011-11-14 DIAGNOSIS — R51 Headache: Secondary | ICD-10-CM | POA: Insufficient documentation

## 2011-11-14 DIAGNOSIS — Z79899 Other long term (current) drug therapy: Secondary | ICD-10-CM | POA: Insufficient documentation

## 2011-11-14 DIAGNOSIS — G40909 Epilepsy, unspecified, not intractable, without status epilepticus: Secondary | ICD-10-CM | POA: Insufficient documentation

## 2011-11-14 NOTE — ED Notes (Signed)
Pt c/o seizure, unwitnessed between 2100-2200 tonight. Pt states she was sitting in a chair and woke up on the floor. Pt c/o head pain and cracked nail.

## 2011-11-15 ENCOUNTER — Other Ambulatory Visit: Payer: Self-pay

## 2011-11-15 MED ORDER — LEVETIRACETAM 1000 MG PO TABS: ORAL_TABLET | ORAL | Status: DC

## 2011-11-15 MED ORDER — LORAZEPAM 1 MG PO TABS
1.0000 mg | ORAL_TABLET | Freq: Once | ORAL | Status: AC
  Administered 2011-11-15: 1 mg via ORAL
  Filled 2011-11-15: qty 1

## 2011-11-15 MED ORDER — TOPIRAMATE 50 MG PO TABS: 100.0000 mg | ORAL_TABLET | Freq: Two times a day (BID) | ORAL | Status: DC

## 2011-11-15 MED ORDER — OXYCODONE-ACETAMINOPHEN 5-325 MG PO TABS: 1.0000 | ORAL_TABLET | ORAL | Status: AC | PRN

## 2011-11-15 MED ORDER — OXYCODONE-ACETAMINOPHEN 5-325 MG PO TABS
1.0000 | ORAL_TABLET | Freq: Once | ORAL | Status: AC
  Administered 2011-11-15: 1 via ORAL
  Filled 2011-11-15: qty 1

## 2011-11-15 NOTE — ED Provider Notes (Signed)
History     CSN: [id number]  Arrival date & time 11/14/11  2309   First MD Initiated Contact with Patient 11/14/11 2331      Chief Complaint  Patient presents with  . Seizures     Patient is a 31 y.o. female presenting with seizures. The history is provided by the patient.  Seizures  This is a recurrent problem. The current episode started 3 to 5 hours ago. The problem has been rapidly improving. Associated symptoms include headaches. Pertinent negatives include no confusion, no neck stiffness and no vomiting. The episode was not witnessed. There has been no fever.  This patient thinks she had a seizure She reports she was sitting in a chair, and then woke up in the floor and she presumes that she had a seizure.  It was not witnessed.  She is now back to baseline.  She may have hit her head as her head hurts No fever/chills/vomiting/diarrhea/cp/back pain She is med compliant - she takes keppra.  Recent increase to 2gm PO BID No urinary incontinence and no tongue biting No focal weakness No visual changes  Past Medical History  Diagnosis Date  . Seizures   . Bipolar affective disorder   . Seizure     History reviewed. No pertinent past surgical history.  History reviewed. No pertinent family history.  History  Substance Use Topics  . Smoking status: Never Smoker   . Smokeless tobacco: Not on file  . Alcohol Use: No    OB History    Grav Para Term Preterm Abortions TAB SAB Ect Mult Living                  Review of Systems  Gastrointestinal: Negative for vomiting.  Neurological: Positive for seizures and headaches.  Psychiatric/Behavioral: Negative for confusion.  All other systems reviewed and are negative.    Allergies  Review of patient's allergies indicates no known allergies.  Home Medications   Current Outpatient Rx  Name Route Sig Dispense Refill  . IBUPROFEN 200 MG PO TABS Oral Take 600 mg by mouth every 6 (six) hours as needed. For pain.     Marland Kitchen  LEVETIRACETAM 1000 MG PO TABS Oral Take 2 tablets (2,000 mg total) by mouth 2 (two) times daily. 60 tablet 0  . TOPIRAMATE 50 MG PO TABS Oral Take 50 mg by mouth 2 (two) times daily.       BP 154/90  Pulse 111  Temp(Src) 99.1 F (37.3 C) (Oral)  Resp 18  SpO2 99%  LMP 11/02/2011  Physical Exam CONSTITUTIONAL: Well developed/well nourished HEAD AND FACE: Normocephalic/atraumatic EYES: EOMI/PERRL ENMT: Mucous membranes moist, no tongue lacerations NECK: supple no meningeal signs SPINE:entire spine nontender CV: S1/S2 noted, no murmurs/rubs/gallops noted LUNGS: Lungs are clear to auscultation bilaterally, no apparent distress ABDOMEN: soft, nontender, no rebound or guarding GU:no cva tenderness NEURO: Pt is awake/alert, moves all extremitiesx4, GCS 15, no focal motor deficits, using her phone when I enter the room.  No confusion noted EXTREMITIES: pulses normal, full ROM, no tenderness SKIN: warm, color normal PSYCH: no abnormalities of mood noted  ED Course  Procedures    12:37 AM D/w dr Roseanne Reno, neuro Recommend increase topamax to 100mg  BID  12:51 AM Pt is agreeable with plan, reports only mild HA No other complaints She has been ambulatory   MDM  Nursing notes reviewed and considered in documentation Previous records reviewed and considered        Date: 11/15/2011  Rate: 98  Rhythm: normal sinus rhythm  QRS Axis: normal  Intervals: normal  ST/T Wave abnormalities: nonspecific ST changes  Conduction Disutrbances:none  Narrative Interpretation:   Old EKG Reviewed: unchanged    Joya Gaskins, MD 11/15/11 [id number]

## 2011-11-19 ENCOUNTER — Encounter (HOSPITAL_COMMUNITY): Payer: Self-pay | Admitting: Emergency Medicine

## 2011-11-19 ENCOUNTER — Emergency Department (HOSPITAL_COMMUNITY)
Admission: EM | Admit: 2011-11-19 | Discharge: 2011-11-19 | Disposition: A | Discharge status: Home / Self Care | Payer: Medicare Other | Attending: Emergency Medicine | Admitting: Emergency Medicine

## 2011-11-19 DIAGNOSIS — F319 Bipolar disorder, unspecified: Secondary | ICD-10-CM | POA: Insufficient documentation

## 2011-11-19 DIAGNOSIS — G40909 Epilepsy, unspecified, not intractable, without status epilepticus: Secondary | ICD-10-CM | POA: Insufficient documentation

## 2011-11-19 DIAGNOSIS — R51 Headache: Secondary | ICD-10-CM | POA: Insufficient documentation

## 2011-11-19 LAB — URINALYSIS, ROUTINE W REFLEX MICROSCOPIC
Bilirubin Urine: NEGATIVE
Ketones, ur: NEGATIVE mg/dL
Leukocytes, UA: NEGATIVE
Nitrite: NEGATIVE
Protein, ur: NEGATIVE mg/dL
Urobilinogen, UA: 0.2 mg/dL (ref 0.0–1.0)
pH: 6 (ref 5.0–8.0)

## 2011-11-19 LAB — BASIC METABOLIC PANEL
BUN: 10 mg/dL (ref 6–23)
Chloride: 104 mEq/L (ref 96–112)
Creatinine, Ser: 0.73 mg/dL (ref 0.50–1.10)
GFR calc Af Amer: >90 mL/min (ref >90)
Glucose, Bld: 96 mg/dL (ref 70–99)

## 2011-11-19 MED ORDER — LEVETIRACETAM 500 MG PO TABS
1000.0000 mg | ORAL_TABLET | Freq: Once | ORAL | Status: AC
  Administered 2011-11-19: 1000 mg via ORAL
  Filled 2011-11-19: qty 2

## 2011-11-19 MED ORDER — TOPIRAMATE 25 MG PO TABS
50.0000 mg | ORAL_TABLET | Freq: Once | ORAL | Status: AC
  Administered 2011-11-19: 50 mg via ORAL
  Filled 2011-11-19: qty 2

## 2011-11-19 MED ORDER — ONDANSETRON 4 MG PO TBDP
4.0000 mg | ORAL_TABLET | Freq: Once | ORAL | Status: AC
  Administered 2011-11-19: 4 mg via ORAL
  Filled 2011-11-19: qty 1

## 2011-11-19 MED ORDER — MORPHINE SULFATE 2 MG/ML IJ SOLN
2.0000 mg | Freq: Once | INTRAMUSCULAR | Status: AC
  Administered 2011-11-19: 2 mg via INTRAVENOUS
  Filled 2011-11-19: qty 1

## 2011-11-19 MED ORDER — SODIUM CHLORIDE 0.9 % IV SOLN
INTRAVENOUS | Status: DC
  Administered 2011-11-19: 21:00:00 via INTRAVENOUS

## 2011-11-19 NOTE — ED Notes (Signed)
ZOX:[id number]<AV> Expected date:11/19/11<BR> Expected time: 6:05 PM<BR> Means of arrival:Ambulance<BR> Comments:<BR> EMS 71 GC, 30 yof seizure

## 2011-11-19 NOTE — ED Notes (Signed)
UPT:negative

## 2011-11-19 NOTE — ED Notes (Signed)
Per EMS pt.'s neighbor called  Help when they found pt. Having seizure inside her car in her parking lot. Pt. Found confused but denied of SOB, denied of pain .

## 2011-11-19 NOTE — ED Provider Notes (Signed)
History     CSN: [id number]  Arrival date & time 11/19/11  1810   First MD Initiated Contact with Patient 11/19/11 1937      Chief Complaint  Patient presents with  . Seizures    (Consider location/radiation/quality/duration/timing/severity/associated sxs/prior treatment) The history is provided by the patient and medical records.   The patient is a 31 year old, female, with epilepsy.  She takes Topamax and Keppra for her seizures.  She says that she had a seizure while she was in the car.  Today.  She did not bite her tongue, but she did have urinary incontinence.  She has a mild headache.  Now.  She denies nausea, vomiting, fevers, chills, cough, urinary tract symptoms.  Her last seizure was or days ago.  The last seizure prior to that was greater than a month ago.  She says that she has seizures approximately once per month despite taking her medicines.  She denies missing her medications and denies recent illnesses.  She used to see a neurologist at [facility]  However, since she has missed a few appointments may have fired her from their practice, and now.  She is going to a neurologist in [facility]. Past Medical History  Diagnosis Date  . Seizures   . Bipolar affective disorder   . Seizure     History reviewed. No pertinent past surgical history.  No family history on file.  History  Substance Use Topics  . Smoking status: Never Smoker   . Smokeless tobacco: Not on file  . Alcohol Use: No    OB History    Grav Para Term Preterm Abortions TAB SAB Ect Mult Living                  Review of Systems  Constitutional: Negative for fever and chills.  HENT: Negative for neck pain.   Eyes: Negative for visual disturbance.  Respiratory: Negative for cough and shortness of breath.   Cardiovascular: Negative for chest pain and palpitations.  Gastrointestinal: Negative for nausea, vomiting and abdominal pain.  Genitourinary: Negative for dysuria and  hematuria.       Urinary incontinence during the seizure  Musculoskeletal: Negative for back pain.  Neurological: Positive for seizures and headaches.  Psychiatric/Behavioral: Negative for confusion.    Allergies  Review of patient's allergies indicates no known allergies.  Home Medications   Current Outpatient Rx  Name Route Sig Dispense Refill  . IBUPROFEN 200 MG PO TABS Oral Take 600 mg by mouth every 6 (six) hours as needed. For pain.     Marland Kitchen LEVETIRACETAM 1000 MG PO TABS Oral Take 2 tablets (2,000 mg total) by mouth 2 (two) times daily. 60 tablet 0  . OXYCODONE-ACETAMINOPHEN 5-325 MG PO TABS Oral Take 1 tablet by mouth every 4 (four) hours as needed for pain. 10 tablet 0  . TOPIRAMATE 50 MG PO TABS Oral Take 100 mg by mouth daily.      BP 143/84  Pulse 100  Temp(Src) 98.2 F (36.8 C) (Oral)  Resp 18  SpO2 99%  LMP 11/02/2011  Physical Exam  Vitals reviewed. Constitutional: She is oriented to person, place, and time.       Morbidly obese  HENT:  Head: Normocephalic and atraumatic.  Mouth/Throat: Oropharynx is clear and moist.       No tongue lesion  Eyes: Conjunctivae are normal. Pupils are equal, round, and reactive to light.  Neck: Normal range of motion. Neck supple.  Cardiovascular: Normal  rate.   No murmur heard. Pulmonary/Chest: Effort normal and breath sounds normal.  Abdominal: Soft. There is no tenderness.  Musculoskeletal: Normal range of motion.  Neurological: She is alert and oriented to person, place, and time. No cranial nerve deficit.       Normal motor examination in all 4 extremities  Skin: Skin is warm and dry.  Psychiatric: She has a normal mood and affect. Her behavior is normal. Judgment and thought content normal.    ED Course  Procedures (including critical care time) 31 year old, female, with epilepsy, had a breakthrough seizure.  Currently, she has a mild headache, but completely normal.  Mental status and neurological examination.  We  will perform a blood tests, and urine pregnancy test and treat her headache.  There is no indication for CAT scan of her head or acute treatment of seizures.  At this time.   Labs Reviewed  BASIC METABOLIC PANEL  URINE CULTURE  URINALYSIS, ROUTINE W REFLEX MICROSCOPIC   No results found.   No diagnosis found.    MDM  Breakthrough seizure Normal.  Mental status.  No signs of toxicity, normal.  Neurological exam. Not pregnant         Nicholes Stairs, MD 11/19/11 2314

## 2011-11-21 LAB — URINE CULTURE
Colony Count: >=100000
Culture  Setup Time: 201302010112

## 2011-11-23 ENCOUNTER — Emergency Department (INDEPENDENT_AMBULATORY_CARE_PROVIDER_SITE_OTHER)
Admission: EM | Admit: 2011-11-23 | Discharge: 2011-11-23 | Disposition: A | Discharge status: Home / Self Care | Payer: Medicare Other | Source: Home / Self Care | Attending: Family Medicine | Admitting: Family Medicine

## 2011-11-23 ENCOUNTER — Encounter (HOSPITAL_COMMUNITY): Payer: Self-pay

## 2011-11-23 DIAGNOSIS — N760 Acute vaginitis: Secondary | ICD-10-CM

## 2011-11-23 LAB — WET PREP, GENITAL: Trich, Wet Prep: NONE SEEN

## 2011-11-23 LAB — POCT URINALYSIS DIP (DEVICE)
Hgb urine dipstick: NEGATIVE
Nitrite: NEGATIVE
Specific Gravity, Urine: 1.015 (ref 1.005–1.030)
Urobilinogen, UA: 1 mg/dL (ref 0.0–1.0)
pH: 8.5 — ABNORMAL HIGH (ref 5.0–8.0)

## 2011-11-23 MED ORDER — FLUCONAZOLE 150 MG PO TABS: 150.0000 mg | ORAL_TABLET | Freq: Once | ORAL | Status: AC

## 2011-11-23 MED ORDER — TERCONAZOLE 80 MG VA SUPP: 80.0000 mg | Freq: Every day | VAGINAL | Status: AC

## 2011-11-23 NOTE — ED Notes (Signed)
Rx for Cipro 250mg  BID x 3days [name] [name])

## 2011-11-23 NOTE — ED Notes (Signed)
C/o vaginal itching and discomfort for 2 days.  States she also saw some brownish discharge one day.

## 2011-11-23 NOTE — ED Provider Notes (Signed)
History     CSN: [id number]  Arrival date & time 11/23/11  1358   First MD Initiated Contact with Patient 11/23/11 1508      Chief Complaint  Patient presents with  . Vaginal Itching    (Consider location/radiation/quality/duration/timing/severity/associated sxs/prior treatment) Patient is a 31 y.o. female presenting with vaginal itching. The history is provided by the patient.  Vaginal Itching This is a new problem. The current episode started 2 days ago. The problem occurs constantly. The problem has not changed since onset.Pertinent negatives include no abdominal pain. Associated symptoms comments: Itching and d/c, no known std exposure..    Past Medical History  Diagnosis Date  . Seizures   . Bipolar affective disorder   . Seizure     No past surgical history on file.  No family history on file.  History  Substance Use Topics  . Smoking status: Never Smoker   . Smokeless tobacco: Not on file  . Alcohol Use: No    OB History    Grav Para Term Preterm Abortions TAB SAB Ect Mult Living                  Review of Systems  Constitutional: Negative.   Gastrointestinal: Negative.  Negative for abdominal pain.  Genitourinary: Positive for vaginal discharge. Negative for vaginal bleeding, vaginal pain and pelvic pain.    Allergies  Review of patient's allergies indicates no known allergies.  Home Medications   Current Outpatient Rx  Name Route Sig Dispense Refill  . IBUPROFEN 200 MG PO TABS Oral Take 600 mg by mouth every 6 (six) hours as needed. For pain.     Marland Kitchen LEVETIRACETAM 1000 MG PO TABS Oral Take 2 tablets (2,000 mg total) by mouth 2 (two) times daily. 60 tablet 0  . OXYCODONE-ACETAMINOPHEN 5-325 MG PO TABS Oral Take 1 tablet by mouth every 4 (four) hours as needed for pain. 10 tablet 0  . TOPIRAMATE 50 MG PO TABS Oral Take 100 mg by mouth daily.      Pulse 98  Temp(Src) 98.7 F (37.1 C) (Oral)  Resp 20  SpO2 99%  LMP 11/02/2011  Physical Exam    Nursing note and vitals reviewed. Constitutional: She appears well-developed and well-nourished.  Abdominal: Soft. Bowel sounds are normal. There is no tenderness. There is no guarding.  Genitourinary: Uterus normal.    No erythema, tenderness or bleeding around the vagina. Vaginal discharge found.    ED Course  Procedures (including critical care time)  Labs Reviewed - No data to display No results found.   No diagnosis found.    MDM          Barkley Bruns, MD 11/23/11 [phone]

## 2011-11-23 NOTE — ED Notes (Signed)
Spoke w/pt.  Informed of (+) URNC and need for addl tx.  Rx for Cipro 250 mg BID x 3 days. Called into [facility] [phone].

## 2011-11-24 ENCOUNTER — Telehealth (HOSPITAL_COMMUNITY): Payer: Self-pay | Admitting: *Deleted

## 2011-11-24 LAB — GC/CHLAMYDIA PROBE AMP, GENITAL: GC Probe Amp, Genital: NEGATIVE

## 2011-11-24 MED ORDER — METRONIDAZOLE 250 MG PO TABS: 250.0000 mg | ORAL_TABLET | Freq: Three times a day (TID) | ORAL | Status: AC

## 2011-11-24 NOTE — ED Notes (Signed)
GC/Chlamydia neg., Wet prep: Many clue cells, mod. WBC's.  Pt. treated with Terazol and Diflucan. Discussed with Dr. Artis Flock and he e-prescribed Rx. Flagyl  250 mg. TID #21 to the [facility] at [address] and [address]  I called pt. and left message to call. [name] [name] 11/24/2011

## 2011-11-25 ENCOUNTER — Telehealth (HOSPITAL_COMMUNITY): Payer: Self-pay | Admitting: *Deleted

## 2011-11-25 NOTE — ED Notes (Signed)
2/5 1800 Pt. called back, verified, given results, told her she needs Flagyl and Rx. Was sent to [facility] on [address]  Pt. Instructed no alcohol while taking the medication. [name] [name] 11/25/2011

## 2011-12-11 ENCOUNTER — Emergency Department (HOSPITAL_COMMUNITY)
Admission: EM | Admit: 2011-12-11 | Discharge: 2011-12-11 | Disposition: A | Discharge status: Home / Self Care | Payer: Medicare Other | Attending: Emergency Medicine | Admitting: Emergency Medicine

## 2011-12-11 ENCOUNTER — Encounter (HOSPITAL_COMMUNITY): Payer: Self-pay | Admitting: Emergency Medicine

## 2011-12-11 DIAGNOSIS — F319 Bipolar disorder, unspecified: Secondary | ICD-10-CM | POA: Insufficient documentation

## 2011-12-11 DIAGNOSIS — G40909 Epilepsy, unspecified, not intractable, without status epilepticus: Secondary | ICD-10-CM | POA: Insufficient documentation

## 2011-12-11 LAB — DIFFERENTIAL
Basophils Absolute: 0 10*3/uL (ref 0.0–0.1)
Basophils Relative: 0 % (ref 0–1)
Eosinophils Absolute: 0.1 10*3/uL (ref 0.0–0.7)
Eosinophils Relative: 1 % (ref 0–5)
Monocytes Absolute: 0.3 10*3/uL (ref 0.1–1.0)

## 2011-12-11 LAB — CBC
HCT: 37.8 % (ref 36.0–46.0)
MCH: 21.8 pg — ABNORMAL LOW (ref 26.0–34.0)
MCHC: 31 g/dL (ref 30.0–36.0)
MCV: 70.4 fL — ABNORMAL LOW (ref 78.0–100.0)
Platelets: 517 10*3/uL — ABNORMAL HIGH (ref 150–400)
RDW: 18.3 % — ABNORMAL HIGH (ref 11.5–15.5)
WBC: 7 10*3/uL (ref 4.0–10.5)

## 2011-12-11 LAB — BASIC METABOLIC PANEL
CO2: 25 mEq/L (ref 19–32)
Calcium: 9.1 mg/dL (ref 8.4–10.5)
Creatinine, Ser: 0.69 mg/dL (ref 0.50–1.10)
GFR calc Af Amer: >90 mL/min (ref >90)
GFR calc non Af Amer: >90 mL/min (ref >90)
Sodium: 138 mEq/L (ref 135–145)

## 2011-12-11 LAB — POCT PREGNANCY, URINE: Preg Test, Ur: NEGATIVE

## 2011-12-11 MED ORDER — LEVETIRACETAM 1000 MG PO TABS: 2000.0000 mg | ORAL_TABLET | Freq: Two times a day (BID) | ORAL | Status: DC

## 2011-12-11 MED ORDER — LORAZEPAM 2 MG/ML IJ SOLN
1.0000 mg | Freq: Once | INTRAMUSCULAR | Status: AC
  Administered 2011-12-11: 1 mg via INTRAVENOUS
  Filled 2011-12-11: qty 1

## 2011-12-11 MED ORDER — METOCLOPRAMIDE HCL 5 MG/ML IJ SOLN
10.0000 mg | Freq: Once | INTRAMUSCULAR | Status: AC
  Administered 2011-12-11: 10 mg via INTRAVENOUS
  Filled 2011-12-11: qty 2

## 2011-12-11 MED ORDER — TOPIRAMATE 25 MG PO TABS: 75.0000 mg | ORAL_TABLET | Freq: Every day | ORAL | Status: DC

## 2011-12-11 MED ORDER — SODIUM CHLORIDE 0.9 % IV BOLUS (SEPSIS)
1000.0000 mL | Freq: Once | INTRAVENOUS | Status: AC
  Administered 2011-12-11: 1000 mL via INTRAVENOUS

## 2011-12-11 NOTE — Discharge Instructions (Signed)
Take keppra and topamax as prescribed.  Follow up with your new neurologist as soon as possible.  You should return to the ER if you have atypical seizure activity.  You should not be driving.    aDriving and Equipment Restrictions Some medical problems make it dangerous to drive, ride a bike, or use machines. Some of these problems are:  A hard blow to the head (concussion).   Passing out (fainting).   Twitching and shaking (seizures).   Low blood sugar.   Taking medicine to help you relax (sedatives).   Taking pain medicines.   Wearing an eye patch.   Wearing splints. This can make it hard to use parts of your body that you need to drive safely.  HOME CARE   Do not drive until your doctor says it is okay.   Do not use machines until your doctor says it is okay.  You may need a form signed by your doctor (medical release) before you can drive again. You may also need this form before you do other tasks where you need to be fully alert. MAKE SURE YOU:  Understand these instructions.   Will watch your condition.   Will get help right away if you are not doing well or get worse.  Document Released: 11/12/2004 Document Revised: 06/17/2011 Document Reviewed: 02/12/2010 [facility] Patient Information 2012 [place], [place].

## 2011-12-11 NOTE — ED Notes (Signed)
Patient denies pain and is resting comfortably.  

## 2011-12-11 NOTE — ED Notes (Signed)
Patient is resting comfortably. 

## 2011-12-11 NOTE — ED Notes (Signed)
Pt told ems she awoke w/ a dry mouth, headache, generalized pain and felt "like I normally do after a seizure" and called EMS.  Pt was not noted by EMS to be post-ictal or incontinent.  VSS 145/97, 80, 18, CBG 91.

## 2011-12-11 NOTE — ED Provider Notes (Signed)
History     CSN: [id number]  Arrival date & time 12/11/11  1608   First MD Initiated Contact with Patient 12/11/11 1629      Chief Complaint  Patient presents with  . Seizures    unwitnessed. pt was not post-ictal, nor incontinent per ems.      (Consider location/radiation/quality/duration/timing/severity/associated sxs/prior treatment) HPI History provided by pt.   Pt has h/o seizure disorder for which she is compliant w/ keppra and topamax.  Believes she had an unwitnessed seizure this afternoon while sitting on the couch.  Woke up slumped over with a headache, diaphoresis, chills, dry mouth and drowsiness.  All of these symptoms are typical of her post-ictal period.  Did not bite her tongue and was not incontinent of urine.  No recent head injury.  No recent illnesses.  Was a patient at [facility] but has been discharged from practice d/t missed appointments.    Past Medical History  Diagnosis Date  . Seizures   . Bipolar affective disorder   . Seizure     No past surgical history on file.  No family history on file.  History  Substance Use Topics  . Smoking status: Never Smoker   . Smokeless tobacco: Not on file  . Alcohol Use: No    OB History    Grav Para Term Preterm Abortions TAB SAB Ect Mult Living                  Review of Systems  All other systems reviewed and are negative.    Allergies  Review of patient's allergies indicates no known allergies.  Home Medications   Current Outpatient Rx  Name Route Sig Dispense Refill  . LEVETIRACETAM 1000 MG PO TABS Oral Take 2 tablets (2,000 mg total) by mouth 2 (two) times daily. 60 tablet 0  . TOPIRAMATE 50 MG PO TABS Oral Take 100 mg by mouth at bedtime.       BP 128/76  Pulse 76  Temp(Src) 98.2 F (36.8 C) (Oral)  Resp 19  SpO2 99%  LMP 11/02/2011  Physical Exam  Nursing note and vitals reviewed. Constitutional: She is oriented to person, place, and time. She appears well-developed and  well-nourished. No distress.  HENT:  Head: Normocephalic and atraumatic.       Mucous membranes dry  Eyes:       Normal appearance  Neck: Normal range of motion.  Cardiovascular: Normal rate and regular rhythm.   Pulmonary/Chest: Effort normal and breath sounds normal.  Musculoskeletal: Normal range of motion.  Neurological: She is alert and oriented to person, place, and time. She has normal reflexes. No cranial nerve deficit or sensory deficit. Coordination normal.       5/5 and equal upper and lower extremity strength.  No past pointing.     Skin: Skin is warm and dry. No rash noted.  Psychiatric: She has a normal mood and affect. Her behavior is normal.    ED Course  Procedures (including critical care time)   Date: 12/11/2011  Rate: 76  Rhythm: normal sinus rhythm  QRS Axis: normal  Intervals: normal  ST/T Wave abnormalities: nonspecific T wave changes  Conduction Disutrbances:none  Narrative Interpretation:   Old EKG Reviewed: unchanged   Labs Reviewed  CBC - Abnormal; Notable for the following:    RBC 5.37 (*)    Hemoglobin 11.7 (*)    MCV 70.4 (*)    MCH 21.8 (*)    RDW 18.3 (*)  Platelets 517 (*)    All other components within normal limits  DIFFERENTIAL  BASIC METABOLIC PANEL  POCT PREGNANCY, URINE   No results found.   1. Seizure disorder       MDM  Pt w/ h/o seizure disorder presents w/ c/o seizure.   Compliant w/ anti-epileptics and no recent head trauma or illnesses.  No fever or focal neuro deficits on exam.  Appears dehydrated. Basic labs including urine pregnancy are pending. EKG ordered in case this was actually a syncopal event rather than seizure.  Pt has received 1mg  IV ativan as well as NS bolus.    Labs unremarkable.  No seizure activity in ED.  Consulted Dr. Thad Ranger w/ neuro and she does not recommend giving her a Keppra bolus in ED but would increase her topamax dose from 50mg  to 75qhs (pt takes at night rather than bid d/t SE  profile).  Pt d/c'd home w/ refills of these two prescriptions.  Advised her to f/u with Neurologist in [facility] asap and return to the ER if she has atypical seizure activity.   Also recommended f/u with her PCP for non-specific T wave changes on EGK.          Arie Sabina [place], [place] 12/11/11 1909

## 2011-12-11 NOTE — ED Notes (Signed)
Vital signs stable. 

## 2011-12-11 NOTE — ED Notes (Signed)
PA at bedside.

## 2011-12-12 NOTE — ED Provider Notes (Signed)
Medical screening examination/treatment/procedure(s) were performed by non-physician practitioner and as supervising physician I was immediately available for consultation/collaboration.  Flint Melter, MD 12/12/11 1220

## 2011-12-30 ENCOUNTER — Encounter: Payer: Self-pay | Admitting: Neurology

## 2011-12-30 ENCOUNTER — Emergency Department (HOSPITAL_COMMUNITY)
Admission: EM | Admit: 2011-12-30 | Discharge: 2011-12-30 | Disposition: A | Discharge status: Home / Self Care | Payer: Medicare Other | Attending: Emergency Medicine | Admitting: Emergency Medicine

## 2011-12-30 ENCOUNTER — Encounter (HOSPITAL_COMMUNITY): Payer: Self-pay | Admitting: Cardiology

## 2011-12-30 DIAGNOSIS — F319 Bipolar disorder, unspecified: Secondary | ICD-10-CM | POA: Insufficient documentation

## 2011-12-30 DIAGNOSIS — R569 Unspecified convulsions: Secondary | ICD-10-CM | POA: Insufficient documentation

## 2011-12-30 MED ORDER — LORAZEPAM 1 MG PO TABS
1.0000 mg | ORAL_TABLET | Freq: Once | ORAL | Status: AC
  Administered 2011-12-30: 1 mg via ORAL
  Filled 2011-12-30: qty 1

## 2011-12-30 MED ORDER — LEVETIRACETAM 1000 MG PO TABS: 1000.0000 mg | ORAL_TABLET | Freq: Two times a day (BID) | ORAL | Status: DC

## 2011-12-30 MED ORDER — ACETAMINOPHEN 325 MG PO TABS
650.0000 mg | ORAL_TABLET | Freq: Once | ORAL | Status: AC
  Administered 2011-12-30: 650 mg via ORAL
  Filled 2011-12-30: qty 2

## 2011-12-30 MED ORDER — LORAZEPAM 2 MG/ML IJ SOLN
1.0000 mg | Freq: Once | INTRAMUSCULAR | Status: DC
  Filled 2011-12-30: qty 1

## 2011-12-30 MED ORDER — LEVETIRACETAM 500 MG PO TABS
1000.0000 mg | ORAL_TABLET | ORAL | Status: AC
  Administered 2011-12-30: 1000 mg via ORAL
  Filled 2011-12-30: qty 2

## 2011-12-30 NOTE — ED Notes (Signed)
Attempted to start IV and draw labs. Pt states that she does not want to be stuck for an IV or Lab work. States that she wants pills for her seizures. PA informed about pt request.

## 2011-12-30 NOTE — ED Notes (Signed)
Pt to department from home via EMS- reports that she had a witnessed seizure by her boyfriend that lasted about 5 minutes. EMS reports that she was postictial on arrival. MD has recently changed her meds. Bp- 150/90 Hr- 112. Pt A&Ox4 on arrival.

## 2011-12-30 NOTE — ED Provider Notes (Signed)
History     CSN: [id number]  Arrival date & time 12/30/11  4098   First MD Initiated Contact with Patient 12/30/11 [phone]      Chief Complaint  Patient presents with  . Seizures    (Consider location/radiation/quality/duration/timing/severity/associated sxs/prior treatment) HPI History is obtained from the patient. 31 year old female with a history of seizures presents after a seizure this morning. Her boyfriend was at home in the time and witnessed the event. States it is typical of her normal seizure activity and lasted approximately 5-8 minutes. She did bite her tongue but did not become incontinent. She did not hit her head. She was post ictal afterwards. Denies recent head injury, illness. She does not currently have a neurologist who follows her, and she was recently discharged from [facility] neurolog[facility] for missing appointments. She is attempting to get established with a neurologist in [facility]. She is currently supposed to be taking 1000 mg of Keppra twice daily along with Topamax, but states that she's only been taking 750 mg twice daily. Currently c/o HA but has no other complaints. She feels sleepy and "worn out."  Past Medical History  Diagnosis Date  . Seizures   . Bipolar affective disorder   . Seizure     History reviewed. No pertinent past surgical history.  History reviewed. No pertinent family history.  History  Substance Use Topics  . Smoking status: Never Smoker   . Smokeless tobacco: Not on file  . Alcohol Use: No    OB History    Grav Para Term Preterm Abortions TAB SAB Ect Mult Living                  Review of Systems  Constitutional: Negative for fever, chills, activity change and appetite change.  HENT: Negative for congestion, sore throat, rhinorrhea, neck pain and neck stiffness.   Eyes: Negative for photophobia and visual disturbance.  Respiratory: Negative for chest tightness and shortness of breath.   Cardiovascular: Negative for chest  pain.  Gastrointestinal: Negative for nausea, vomiting and abdominal pain.  Musculoskeletal: Negative for myalgias.  Skin: Negative for color change and rash.  Neurological: Positive for seizures and headaches. Negative for dizziness, syncope, speech difficulty, weakness and light-headedness.    Allergies  Review of patient's allergies indicates no known allergies.  Home Medications   Current Outpatient Rx  Name Route Sig Dispense Refill  . LEVETIRACETAM 1000 MG PO TABS Oral Take 2 tablets (2,000 mg total) by mouth 2 (two) times daily. 120 tablet 0  . TOPIRAMATE 50 MG PO TABS Oral Take 100 mg by mouth at bedtime.       BP 122/58  Pulse 102  Temp(Src) 98.3 F (36.8 C) (Oral)  Resp 20  SpO2 98%  Physical Exam  Nursing note and vitals reviewed. Constitutional: She is oriented to person, place, and time. She appears well-developed and well-nourished. No distress.       Morbidly obese Drowsy, non lethargic, answers questions appropriately  HENT:  Head: Normocephalic and atraumatic.  Right Ear: External ear normal.  Left Ear: External ear normal.  Nose: Nose normal.  Mouth/Throat: Oropharynx is clear and moist. No oropharyngeal exudate.  Eyes: Conjunctivae and EOM are normal. Pupils are equal, round, and reactive to light. Right eye exhibits no discharge. Left eye exhibits no discharge.  Neck: Normal range of motion. Neck supple.       No palpable stepoff, crepitus, deformity. No midline tenderness  Cardiovascular: Normal rate, regular rhythm and normal heart sounds.  Pulmonary/Chest: Effort normal and breath sounds normal. She has no wheezes.  Abdominal: Soft. Bowel sounds are normal. There is no tenderness. There is no rebound and no guarding.  Musculoskeletal: Normal range of motion.  Neurological: She is alert and oriented to person, place, and time. She displays normal reflexes. No cranial nerve deficit. She exhibits normal muscle tone. Coordination normal.  Skin: Skin is  warm and dry. No rash noted. She is not diaphoretic.  Psychiatric: She has a normal mood and affect.    ED Course  Procedures (including critical care time)  Labs Reviewed - No data to display No results found.   1. Seizure       MDM  The patient was monitored here for 2-1/2 hours without further seizure activity. Patient's previous records were reviewed. A consult note from November 2012 indicates that she was taking 1000 mg of Keppra twice a day at that time. We'll restart her at this dose, and I emphasized the importance of taking her medication at this dose. Emphasized the importance of neuro f/u - she was given contact information for Dr.Wong with [facility] to make a followup appointment. She was instructed not to drive for the next 6 months. Return precautions discussed.     Grant Font[place], [place] 12/30/11 1408

## 2011-12-30 NOTE — Discharge Instructions (Signed)
It is important that you call and make a follow up appointment with neurology to get your medications managed. Consider making a follow up appointment with Dr. Modesto Charon with [facility]. Take the Keppra as prescribed - 1000 mg 2x/day. Do not drive until cleared by neurology. Return to the ED for confusion, worsening condition, or other concerns.  Driving and Equipment Restrictions Some medical problems make it dangerous to drive, ride a bike, or use machines. Some of these problems are:  A hard blow to the head (concussion).   Passing out (fainting).   Twitching and shaking (seizures).   Low blood sugar.   Taking medicine to help you relax (sedatives).   Taking pain medicines.   Wearing an eye patch.   Wearing splints. This can make it hard to use parts of your body that you need to drive safely.  HOME CARE   Do not drive until your doctor says it is okay.   Do not use machines until your doctor says it is okay.  You may need a form signed by your doctor (medical release) before you can drive again. You may also need this form before you do other tasks where you need to be fully alert. MAKE SURE YOU:  Understand these instructions.   Will watch your condition.   Will get help right away if you are not doing well or get worse.  Document Released: 11/12/2004 Document Revised: 09/24/2011 Document Reviewed: 02/12/2010 [facility] Patient Information 2012 [place], [place].  Epilepsy A seizure (convulsion) is a sudden change in brain function that causes a change in behavior, muscle activity, or ability to remain awake and alert. If a person has recurring seizures, this is called epilepsy. CAUSES  Epilepsy is a disorder with many possible causes. Anything that disturbs the normal pattern of brain cell activity can lead to seizures. Seizure can be caused from illness to brain damage to abnormal brain development. Epilepsy may develop because of:  An abnormality in brain wiring.   An imbalance  of nerve signaling chemicals (neurotransmitters).   Some combination of these factors.  Scientists are learning an increasing amount about genetic causes of seizures. SYMPTOMS  The symptoms of a seizure can vary greatly from one person to another. These may include:  An aura, or warning that tells a person they are about to have a seizure.   Abnormal sensations, such as abnormal smell or seeing flashing lights.   Sudden, general body stiffness.   Rhythmic jerking of the face, arm, or leg - on one or both sides.   Sudden change in consciousness.   The person may appear to be awake but not responding.   They may appear to be asleep but cannot be awakened.   Grimacing, chewing, lip smacking, or drooling.   Often there is a period of sleepiness after a seizure.  DIAGNOSIS  The description you give to your caregiver about what you experienced will help them understand your problems. Equally important is the description by any witnesses to your seizure. A physical exam, including a detailed neurological exam, is necessary. An EEG (electroencephalogram) is a painless test of your brain waves. In this test a diagram is created of your brain waves. These diagrams can be interpreted by a specialist. Pictures of your brain are usually taken with:  An MRI.   A CT scan.  Lab tests may be done to look for:  Signs of infection.   Abnormal blood chemistry.  PREVENTION  There is no way to prevent  the development of epilepsy. If you have seizures that are typically triggered by an event (such as flashing lights), try to avoid the trigger. This can help you avoid a seizure.  PROGNOSIS  Most people with epilepsy lead outwardly normal lives. While epilepsy cannot currently be cured, for some people it does eventually go away. Most seizures do not cause brain damage. It is not uncommon for people with epilepsy, especially children, to develop behavioral and emotional problems. These problems are  sometimes the consequence of medicine for seizures or social stress. For some people with epilepsy, the risk of seizures restricts their independence and recreational activities. For example, some states refuse drivers licenses to people with epilepsy. Most women with epilepsy can become pregnant. They should discuss their epilepsy and the medicine they are taking with their caregivers. Women with epilepsy have a 90 percent or better chance of having a normal, healthy baby. RISKS AND COMPLICATIONS  People with epilepsy are at increased risk of falls, accidents, and injuries. People with epilepsy are at special risk for two life-threatening conditions. These are status epilepticus and sudden unexplained death (extremely rare). Status epilepticus is a long lasting, continuous seizure that is a medical emergency. TREATMENT  Once epilepsy is diagnosed, it is important to begin treatment as soon as possible. For about 80 percent of those diagnosed with epilepsy, seizures can be controlled with modern medicines and surgical techniques. Some antiepileptic drugs can interfere with the effectiveness of oral contraceptives. In 1997, the FDA approved a pacemaker for the brain the (vagus nerve stimulator). This stimulator can be used for people with seizures that are not well-controlled by medicine. Studies have shown that in some cases, children may experience fewer seizures if they maintain a strict diet. The strict diet is called the ketogenic diet. This diet is rich in fats and low in carbohydrates. HOME CARE INSTRUCTIONS   Your caregiver will make recommendations about driving and safety in normal activities. Follow these carefully.   Take any medicine prescribed exactly as directed.   Do any blood tests requested to monitor the levels of your medicine.   The people you live and work with should know that you are prone to seizures. They should receive instructions on how to help you. In general, a witness to  a seizure should:   Cushion your head and body.   Turn you on your side.   Avoid unnecessarily restraining you.   Not place anything inside your mouth.   Call for local emergency medical help if there is any question about what has occurred.   Keep a seizure diary. Record what you recall about any seizure, especially any possible trigger.   If your caregiver has given you a follow-up appointment, it is very important to keep that appointment. Not keeping the appointment could result in permanent injury and disability. If there is any problem keeping the appointment, you must call back to this facility for assistance.  SEEK MEDICAL CARE IF:   You develop signs of infection or other illness. This might increase the risk of a seizure.   You seem to be having more frequent seizures.   Your seizure pattern is changing.  SEEK IMMEDIATE MEDICAL CARE IF:   A seizure does not stop after a few moments.   A seizure causes any difficulty in breathing.   A seizure results in a very severe headache.   A seizure leaves you with the inability to speak or use a part of your body.  MAKE  SURE YOU:   Understand these instructions.   Will watch your condition.   Will get help right away if you are not doing well or get worse.  Document Released: 10/05/2005 Document Revised: 09/24/2011 Document Reviewed: 05/11/2008 [facility] Patient Information 2012 [place], [place].

## 2011-12-30 NOTE — ED Notes (Signed)
Went into patient's room, explained to her and family member we need a urine sample.  Patient stated she could urinate.  Suggested that she allow me to help her with a bed pan, she refused.  She stated she would be able to walk with boy friend assistance even tho she had a seizure earlier, notified RN.  Went back into room to help with the boy friend and she stated she did not want to get out of bed at this time.

## 2011-12-30 NOTE — ED Provider Notes (Signed)
Medical screening examination/treatment/procedure(s) were performed by non-physician practitioner and as supervising physician I was immediately available for consultation/collaboration. Devoria Albe, MD, Armando Gang   Ward Givens, MD 12/30/11 [phone]

## 2012-01-10 ENCOUNTER — Encounter (HOSPITAL_COMMUNITY): Payer: Self-pay | Admitting: *Deleted

## 2012-01-10 ENCOUNTER — Emergency Department (HOSPITAL_COMMUNITY)
Admission: EM | Admit: 2012-01-10 | Discharge: 2012-01-10 | Disposition: A | Discharge status: Home / Self Care | Payer: Medicare Other | Attending: Emergency Medicine | Admitting: Emergency Medicine

## 2012-01-10 ENCOUNTER — Emergency Department (HOSPITAL_COMMUNITY): Payer: Medicare Other

## 2012-01-10 DIAGNOSIS — M25519 Pain in unspecified shoulder: Secondary | ICD-10-CM | POA: Insufficient documentation

## 2012-01-10 DIAGNOSIS — F319 Bipolar disorder, unspecified: Secondary | ICD-10-CM | POA: Insufficient documentation

## 2012-01-10 DIAGNOSIS — G40919 Epilepsy, unspecified, intractable, without status epilepticus: Secondary | ICD-10-CM

## 2012-01-10 DIAGNOSIS — R569 Unspecified convulsions: Secondary | ICD-10-CM | POA: Insufficient documentation

## 2012-01-10 DIAGNOSIS — M25419 Effusion, unspecified shoulder: Secondary | ICD-10-CM | POA: Insufficient documentation

## 2012-01-10 MED ORDER — HYDROCODONE-ACETAMINOPHEN 5-500 MG PO TABS: 1.0000 | ORAL_TABLET | Freq: Four times a day (QID) | ORAL | Status: AC | PRN

## 2012-01-10 MED ORDER — HYDROCODONE-ACETAMINOPHEN 5-325 MG PO TABS
1.0000 | ORAL_TABLET | Freq: Once | ORAL | Status: AC
  Administered 2012-01-10: 1 via ORAL
  Filled 2012-01-10: qty 1

## 2012-01-10 NOTE — ED Provider Notes (Signed)
History     CSN: [id number]  Arrival date & time 01/10/12  1950   First MD Initiated Contact with Patient 01/10/12 2112      Chief Complaint  Patient presents with  . Seizures    has history of seizures,  had one last night and one today unknown, time,  states her left arm and shoulder is hurting,  not sure if she injured arm,  positive pulse and good movement,  states has been taking seizure medications    (Consider location/radiation/quality/duration/timing/severity/associated sxs/prior treatment) HPI Comments: Patient with a known seizure disorder, takes Keppra and Topamax.  History of breakthrough seizures.  Reports that last night.  She had a seizure.  She was alone in her home.  She had no oral.  She woke up and she had left shoulder pain, which is been persistent throughout the day today.  She thinks she had another short seizure, but this was different because she was aware through the episode.  She reports that she has not been ill in any, way.  She's not missed any of her medications.  She came in for evaluation today because of a left arm, shoulder pain  Patient is a 31 y.o. female presenting with seizures. The history is provided by the patient.  Seizures  This is a recurrent problem. The current episode started yesterday. The problem has been resolved. There were 2 to 3 seizures. Pertinent negatives include no headaches, no chest pain and no nausea. The episode was not witnessed. There was no sensation of an aura present. The seizures did not continue in the ED. Possible causes do not include med or dosage change, sleep deprivation or missed seizure meds. There has been no fever.    Past Medical History  Diagnosis Date  . Seizures   . Bipolar affective disorder   . Seizure   . Seizures     No past surgical history on file.  History reviewed. No pertinent family history.  History  Substance Use Topics  . Smoking status: Never Smoker   . Smokeless tobacco: Not on file   . Alcohol Use: No    OB History    Grav Para Term Preterm Abortions TAB SAB Ect Mult Living                  Review of Systems  Constitutional: Negative for fever.  HENT: Negative for congestion.   Respiratory: Negative for shortness of breath.   Cardiovascular: Negative for chest pain.  Gastrointestinal: Negative for nausea.  Musculoskeletal: Negative for gait problem.  Neurological: Positive for seizures. Negative for dizziness, weakness, numbness and headaches.    Allergies  Review of patient's allergies indicates no known allergies.  Home Medications   Current Outpatient Rx  Name Route Sig Dispense Refill  . LEVETIRACETAM 1000 MG PO TABS Oral Take 1 tablet (1,000 mg total) by mouth 2 (two) times daily. 120 tablet 0  . TOPIRAMATE 50 MG PO TABS Oral Take 100 mg by mouth at bedtime.     Marland Kitchen HYDROCODONE-ACETAMINOPHEN 5-500 MG PO TABS Oral Take 1-2 tablets by mouth every 6 (six) hours as needed for pain. 15 tablet 0    BP 143/69  Pulse 80  Temp(Src) 98.2 F (36.8 C) (Oral)  Resp 20  Wt 340 lb (154.223 kg)  SpO2 100%  LMP 12/31/2011  Physical Exam  Constitutional: She appears well-developed.  HENT:  Head: Normocephalic.  Eyes: Pupils are equal, round, and reactive to light.  Neck: Normal range of  motion.  Cardiovascular: Normal rate.   Pulmonary/Chest: Effort normal.  Musculoskeletal:       Right shoulder: She exhibits decreased range of motion and tenderness.       Arms:   ED Course  Procedures (including critical care time)  Labs Reviewed - No data to display Dg Shoulder Left  01/10/2012  *RADIOLOGY REPORT*  Clinical Data: Seizure, fall with left shoulder pain.  LEFT SHOULDER - 2+ VIEW  Comparison: 09/10/2011  Findings: Inferior subluxation of the left humeral head probably represents displacement by a joint effusion. There is no evidence of fracture or dislocation. No other bony abnormalities are noted. The visualized left hemithorax is unremarkable.   IMPRESSION: Inferior subluxation of the left humeral head likely related to a joint effusion.  No evidence of acute fracture or dislocation.  Original Report Authenticated By: Rosendo Gros, M.D.     1. Shoulder effusion   2. Breakthrough seizure       MDM  Breakthrough seizures, now with a left shoulder contusion        Arman Filter, NP 01/10/12 2235

## 2012-01-10 NOTE — Discharge Instructions (Signed)
Seizures You had a seizure. About 2% of the population will have a seizure problem during their lifetime. Sometimes the cause for the seizure is not known. Seizures are usually associated with one of these problems:  Epilepsy.   Not taking your seizure medicine.   Alcohol and drug abuse.   Head injury, strokes, tumors, and brain surgery.   High fever and infections.   Low blood sugar.  Evaluating a new seizure disorder may require having a brain scan or a brain wave test called an EEG. If you have been given a seizure medicine, it is very important that you take it as prescribed. Not taking these medicines as directed is the most common cause of seizures. Blood tests are often used to be sure you are taking the proper dose.  Seizures cause many different symptoms, from convulsions to brief blackouts. Do not ride a bike, drive a car, go swimming, climb in high or dangerous places such as ladders or roofs, or operate any dangerous equipment until you have your doctor's permission. If you hold a driver's license, state law may require that a report be made to the motor vehicles department. You should wear an emergency medical identification bracelet with information about your seizures. If you have any warning that a seizure may occur, lie down in a safe place to protect yourself. Teach your family and friends what to do if you have any further seizures. They should stay calm and try to keep you from falling on hard or sharp objects. It is best not to try to restrain a seizing person or to force anything into his or her mouth. Do not try to open clenched jaws. When the seizure is over, the person should be rolled on their side to help drain any vomit or secretions from the mouth. After a seizure, a person may be confused or drowsy for several minutes. An ambulance should be called if the seizure lasted more than 5 minutes or if confusion remains for more than 30 minutes. Call your caregiver or the  emergency department for further instructions. Do not drive until cleared by your caregiver or neurologist! Document Released: 11/12/2004 Document Revised: 06/17/2011 Document Reviewed: 10/05/2005 [facility] Patient Information 2012 [place], [place]. You have a small effusion or fluid in the shoulder joint from your fall you have been placed in a sling for comfort Please call Dr. Felizardo Hoffmann office in the monring for an appointment for further evaluation and treatment

## 2012-01-11 NOTE — ED Provider Notes (Signed)
Medical screening examination/treatment/procedure(s) were performed by non-physician practitioner and as supervising physician I was immediately available for consultation/collaboration. Devoria Albe, MD, Armando Gang   Ward Givens, MD 01/11/12 1044

## 2012-01-18 ENCOUNTER — Ambulatory Visit: Payer: Medicare Other | Admitting: Neurology

## 2012-02-05 ENCOUNTER — Encounter (HOSPITAL_COMMUNITY): Payer: Self-pay | Admitting: *Deleted

## 2012-02-05 ENCOUNTER — Emergency Department (HOSPITAL_COMMUNITY)
Admission: EM | Admit: 2012-02-05 | Discharge: 2012-02-06 | Disposition: A | Discharge status: Home / Self Care | Payer: Medicare Other | Attending: Emergency Medicine | Admitting: Emergency Medicine

## 2012-02-05 DIAGNOSIS — R0982 Postnasal drip: Secondary | ICD-10-CM | POA: Insufficient documentation

## 2012-02-05 DIAGNOSIS — H53149 Visual discomfort, unspecified: Secondary | ICD-10-CM | POA: Insufficient documentation

## 2012-02-05 DIAGNOSIS — R569 Unspecified convulsions: Secondary | ICD-10-CM | POA: Insufficient documentation

## 2012-02-05 DIAGNOSIS — F319 Bipolar disorder, unspecified: Secondary | ICD-10-CM | POA: Insufficient documentation

## 2012-02-05 DIAGNOSIS — R51 Headache: Secondary | ICD-10-CM

## 2012-02-05 HISTORY — DX: Migraine, unspecified, not intractable, without status migrainosus: G43.909

## 2012-02-06 ENCOUNTER — Encounter (HOSPITAL_COMMUNITY): Payer: Self-pay | Admitting: Emergency Medicine

## 2012-02-06 MED ORDER — DIPHENHYDRAMINE HCL 50 MG/ML IJ SOLN
50.0000 mg | Freq: Once | INTRAMUSCULAR | Status: AC
  Administered 2012-02-06: 50 mg via INTRAVENOUS
  Filled 2012-02-06: qty 1

## 2012-02-06 MED ORDER — KETOROLAC TROMETHAMINE 30 MG/ML IJ SOLN
30.0000 mg | Freq: Once | INTRAMUSCULAR | Status: AC
  Administered 2012-02-06: 30 mg via INTRAMUSCULAR
  Filled 2012-02-06: qty 1

## 2012-02-06 MED ORDER — FEXOFENADINE-PSEUDOEPHED ER 60-120 MG PO TB12: 1.0000 | ORAL_TABLET | Freq: Two times a day (BID) | ORAL | Status: DC

## 2012-02-06 MED ORDER — LEVETIRACETAM 1000 MG PO TABS: 1000.0000 mg | ORAL_TABLET | Freq: Two times a day (BID) | ORAL | Status: DC

## 2012-02-06 MED ORDER — METOCLOPRAMIDE HCL 5 MG/ML IJ SOLN
10.0000 mg | Freq: Once | INTRAMUSCULAR | Status: AC
  Administered 2012-02-06: 10 mg via INTRAVENOUS
  Filled 2012-02-06: qty 2

## 2012-02-06 NOTE — Discharge Instructions (Signed)
Headache and Allergies  The relationship between allergies and headaches is unclear. Many people with allergic or infectious nasal problems also have headaches (migraines or sinus headaches). However, sometimes allergies can cause pressure that feels like a headache, and sometimes headaches can cause allergy-like symptoms. It is not always clear whether your symptoms are caused by allergies or by a headache.  CAUSES    Migraine: The cause of a migraine is not always known.   Sinus Headache: The cause of a sinus headache may be a sinus infection. Other conditions that may be related to sinus headaches include:   Hay fever (allergic rhinitis).   Deviation of the nasal septum.   Swelling or clogging of the nasal passages.  SYMPTOMS   Migraine headache symptoms (which often last 4 to 72 hours) include:   Intense, throbbing pain on one or both sides of the head.   Nausea.   Vomiting.   Being extra sensitive to light.   Being extra sensitive to sound.   Nervous system reactions that appear similar to an allergic reaction:   Stuffy nose.   Runny nose.   Tearing.  Sinus headaches are felt as facial pain or pressure.   DIAGNOSIS   Because there is some overlap in symptoms, sinus and migraine headaches are often misdiagnosed. For example, a person with migraines may also feel facial pressure. Likewise, many people with hay fever may get migraine headaches rather than sinus headaches. These migraines can be triggered by the histamine release during an allergic reaction. An antihistamine medicine can eliminate this pain.  There are standard criteria that help clarify the difference between these headaches and related allergy or allergy-like symptoms. Your caregiver can use these criteria to determine the proper diagnosis and provide you the best care.  TREATMENT   Migraine medicine may help people who have persistent migraine headaches even though their hay fever is controlled. For some people, anti-inflammatory  treatments do not work to relieve migraines. Medicines called triptans (such as sumatriptan) can be helpful for those people.  Document Released: 12/26/2003 Document Revised: 09/24/2011 Document Reviewed: 01/17/2010  [employer] Patient Information 2012 [employer].

## 2012-02-06 NOTE — ED Provider Notes (Signed)
History     CSN: [id number]  Arrival date & time 02/05/12  2231   First MD Initiated Contact with Patient 02/06/12 0211      Chief Complaint  Patient presents with  . Headache    (Consider location/radiation/quality/duration/timing/severity/associated sxs/prior treatment) HPI Comments: Patient reports that she woke up and had a headache all the way around her head that has been severe all day. She reports some photophobia similar to prior migraines. She also notices a slight postnasal drip making her clear her throat. She denies a sore throat or fever. She reports she can breathe through her nose and denies any significant sinus pressure in particular. She denies ear pain. She reports no stiff neck, trauma, rash. She does have a history of seizures and does not think that she has had a seizure recently. She reports that she took some "generic weak stuff" at home which did not help her headache. Her significant other also requests that we give her a refill of her seizure medicine.  Patient is a 31 y.o. female presenting with headaches. The history is provided by the patient.  Headache  Pertinent negatives include no fever, no shortness of breath, no nausea and no vomiting.    Past Medical History  Diagnosis Date  . Seizures   . Bipolar affective disorder   . Seizure   . Seizures   . Seizures   . Migraines     History reviewed. No pertinent past surgical history.  History reviewed. No pertinent family history.  History  Substance Use Topics  . Smoking status: Never Smoker   . Smokeless tobacco: Not on file  . Alcohol Use: No    OB History    Grav Para Term Preterm Abortions TAB SAB Ect Mult Living                  Review of Systems  Constitutional: Negative.  Negative for fever and chills.  HENT: Positive for postnasal drip. Negative for ear pain, neck pain, neck stiffness and tinnitus.   Eyes: Positive for photophobia.  Respiratory: Negative for cough and shortness  of breath.   Gastrointestinal: Negative for nausea and vomiting.  Musculoskeletal: Negative for back pain.  Skin: Negative for rash.  Neurological: Positive for headaches. Negative for seizures.    Allergies  Review of patient's allergies indicates no known allergies.  Home Medications   Current Outpatient Rx  Name Route Sig Dispense Refill  . OXYCODONE-ACETAMINOPHEN 5-325 MG PO TABS Oral Take 1 tablet by mouth every 4 (four) hours as needed. Pain relief    . TOPIRAMATE 50 MG PO TABS Oral Take 100 mg by mouth at bedtime.     Marland Kitchen FEXOFENADINE-PSEUDOEPHED ER 60-120 MG PO TB12 Oral Take 1 tablet by mouth every 12 (twelve) hours. 14 tablet 0  . LEVETIRACETAM 1000 MG PO TABS Oral Take 1 tablet (1,000 mg total) by mouth 2 (two) times daily. 60 tablet 0    BP 151/82  Pulse 95  Temp(Src) 98.2 F (36.8 C) (Oral)  Resp 20  SpO2 100%  LMP 12/31/2011  Physical Exam  Nursing note and vitals reviewed. Constitutional: She is oriented to person, place, and time. She appears well-developed and well-nourished. No distress.  Neck: Normal range of motion. Neck supple.  Pulmonary/Chest: Effort normal.  Abdominal: Soft.  Neurological: She is alert and oriented to person, place, and time. No cranial nerve deficit. Coordination normal.  Skin: Skin is warm and dry. No rash noted. She is not diaphoretic.  Psychiatric: She has a normal mood and affect.    ED Course  Procedures (including critical care time)  Labs Reviewed - No data to display No results found.   1. Headache       MDM  Will give refill prescription, also allegra D for home, will give IM meds for migraine here, then discharge to home.  No fever, non focal neuro exam, no neck stiffness or meningismus on exam.          Gavin Pound. Oletta Lamas, MD 02/06/12 6578

## 2012-02-06 NOTE — ED Notes (Signed)
Pt c/o headache and congestion that began this morning when she woke up. Pt states headache radiates across forehead and describes it as throbbing. Pt has taken OTC meds but has no relief.

## 2012-02-19 ENCOUNTER — Ambulatory Visit: Payer: Medicare Other | Admitting: Neurology

## 2012-03-07 ENCOUNTER — Encounter (HOSPITAL_COMMUNITY): Payer: Self-pay | Admitting: Emergency Medicine

## 2012-03-07 ENCOUNTER — Emergency Department (HOSPITAL_COMMUNITY): Payer: Medicare Other

## 2012-03-07 ENCOUNTER — Emergency Department (HOSPITAL_COMMUNITY)
Admission: EM | Admit: 2012-03-07 | Discharge: 2012-03-07 | Disposition: A | Discharge status: Home / Self Care | Payer: Medicare Other | Attending: Emergency Medicine | Admitting: Emergency Medicine

## 2012-03-07 DIAGNOSIS — R569 Unspecified convulsions: Secondary | ICD-10-CM

## 2012-03-07 DIAGNOSIS — G40909 Epilepsy, unspecified, not intractable, without status epilepticus: Secondary | ICD-10-CM | POA: Insufficient documentation

## 2012-03-07 DIAGNOSIS — R51 Headache: Secondary | ICD-10-CM | POA: Insufficient documentation

## 2012-03-07 DIAGNOSIS — R55 Syncope and collapse: Secondary | ICD-10-CM | POA: Insufficient documentation

## 2012-03-07 LAB — URINALYSIS, ROUTINE W REFLEX MICROSCOPIC
Protein, ur: 30 mg/dL — AB
Urobilinogen, UA: 1 mg/dL (ref 0.0–1.0)

## 2012-03-07 LAB — URINE MICROSCOPIC-ADD ON

## 2012-03-07 MED ORDER — IBUPROFEN 800 MG PO TABS: 800.0000 mg | ORAL_TABLET | Freq: Three times a day (TID) | ORAL | Status: AC

## 2012-03-07 MED ORDER — OXYCODONE-ACETAMINOPHEN 5-325 MG PO TABS
2.0000 | ORAL_TABLET | Freq: Once | ORAL | Status: AC
  Administered 2012-03-07: 2 via ORAL
  Filled 2012-03-07: qty 2

## 2012-03-07 NOTE — ED Notes (Signed)
[id number]<AV> Expected date:<BR> Expected time: 4:13 PM<BR> Means of arrival:<BR> Comments:<BR> M70 - 30yoF Syncope, nausea

## 2012-03-07 NOTE — ED Provider Notes (Signed)
History     CSN: [id number]  Arrival date & time 03/07/12  1632   First MD Initiated Contact with Patient 03/07/12 1726      Chief Complaint  Patient presents with  . Loss of Consciousness    (Consider location/radiation/quality/duration/timing/severity/associated sxs/prior treatment) HPI Comments: Patient is a store today and had a syncopal episode. It was brief and was witnessed. There is no witnessed seizure activity, although she has a history of frequent seizures, approximately 2 seizures per month. She says this felt like a possible seizure episode, although she typically more confused. Following the episode. She only had a very short period of confusion. Following the spell. She is sore in her muscles, which she typically gets after seizure. She does have a headache primarily to the back of her head, but it goes all over her head. She says she feels achy and she hurt it when she fell and hit the concrete floor. She denies any neck or back pain. She denies any numbness or weakness in her extremity. Denies any recent illnesses. She feels back to her baseline now. Although feels sore all over and complaint of headache.  Patient is a 31 y.o. female presenting with syncope. The history is provided by the patient.  Loss of Consciousness This is a new problem. Pertinent negatives include no chest pain, no abdominal pain, no headaches and no shortness of breath.    Past Medical History  Diagnosis Date  . Seizures   . Bipolar affective disorder   . Seizure   . Seizures   . Seizures   . Migraines     History reviewed. No pertinent past surgical history.  History reviewed. No pertinent family history.  History  Substance Use Topics  . Smoking status: Never Smoker   . Smokeless tobacco: Not on file  . Alcohol Use: No    OB History    Grav Para Term Preterm Abortions TAB SAB Ect Mult Living                  Review of Systems  Constitutional: Negative for fever, chills,  diaphoresis and fatigue.  HENT: Negative for congestion, rhinorrhea, sneezing and neck pain.   Eyes: Negative.   Respiratory: Negative for cough, chest tightness and shortness of breath.   Cardiovascular: Positive for syncope. Negative for chest pain and leg swelling.  Gastrointestinal: Negative for nausea, vomiting, abdominal pain, diarrhea and blood in stool.  Genitourinary: Negative for frequency, hematuria, flank pain and difficulty urinating.  Musculoskeletal: Negative for back pain and arthralgias.  Skin: Negative for rash.  Neurological: Positive for syncope. Negative for dizziness, speech difficulty, weakness, numbness and headaches.  Psychiatric/Behavioral: Negative for confusion.    Allergies  Review of patient's allergies indicates no known allergies.  Home Medications   Current Outpatient Rx  Name Route Sig Dispense Refill  . LEVETIRACETAM 1000 MG PO TABS Oral Take 2,000 mg by mouth 2 (two) times daily.    . OXYCODONE-ACETAMINOPHEN 5-325 MG PO TABS Oral Take 1 tablet by mouth every 4 (four) hours as needed. Pain relief    . TOPIRAMATE 50 MG PO TABS Oral Take 150 mg by mouth at bedtime.     Marland Kitchen FEXOFENADINE-PSEUDOEPHED ER 60-120 MG PO TB12 Oral Take 1 tablet by mouth every 12 (twelve) hours. 14 tablet 0    BP 133/93  Pulse 95  Temp(Src) 98.4 F (36.9 C) (Oral)  Resp 20  SpO2 98%  LMP 03/07/2012  Physical Exam  Constitutional: She is oriented  to person, place, and time. She appears well-developed and well-nourished.  HENT:  Head: Normocephalic and atraumatic.  Eyes: Pupils are equal, round, and reactive to light.  Neck: Normal range of motion. Neck supple.  Cardiovascular: Normal rate, regular rhythm and normal heart sounds.   Pulmonary/Chest: Effort normal and breath sounds normal. No respiratory distress. She has no wheezes. She has no rales. She exhibits no tenderness.  Abdominal: Soft. Bowel sounds are normal. There is no tenderness. There is no rebound and no  guarding.  Musculoskeletal: Normal range of motion. She exhibits no edema.  Lymphadenopathy:    She has no cervical adenopathy.  Neurological: She is alert and oriented to person, place, and time. She has normal strength. No cranial nerve deficit or sensory deficit.  Skin: Skin is warm and dry. No rash noted.  Psychiatric: She has a normal mood and affect.    ED Course  Procedures (including critical care time)  Results for orders placed during the hospital encounter of 03/07/12  URINALYSIS, ROUTINE W REFLEX MICROSCOPIC      Component Value Range   Color, Urine AMBER (*) YELLOW    APPearance CLOUDY (*) CLEAR    Specific Gravity, Urine 1.026  1.005 - 1.030    pH 5.5  5.0 - 8.0    Glucose, UA NEGATIVE  NEGATIVE (mg/dL)   Hgb urine dipstick LARGE (*) NEGATIVE    Bilirubin Urine MODERATE (*) NEGATIVE    Ketones, ur 40 (*) NEGATIVE (mg/dL)   Protein, ur 30 (*) NEGATIVE (mg/dL)   Urobilinogen, UA 1.0  0.0 - 1.0 (mg/dL)   Nitrite NEGATIVE  NEGATIVE    Leukocytes, UA SMALL (*) NEGATIVE   PREGNANCY, URINE      Component Value Range   Preg Test, Ur NEGATIVE  NEGATIVE   URINE MICROSCOPIC-ADD ON      Component Value Range   WBC, UA 7-10  <3 (WBC/hpf)   RBC / HPF TOO NUMEROUS TO COUNT  <3 (RBC/hpf)   Bacteria, UA MANY (*) RARE    Ct Head Wo Contrast  03/07/2012  *RADIOLOGY REPORT*  Clinical Data: Syncope.  Headache.  History of seizures.  CT HEAD WITHOUT CONTRAST 03/07/2012:  Technique:  Contiguous axial images were obtained from the base of the skull through the vertex without contrast.  Comparison: Unenhanced cranial CT 10/11/2011, 07/22/2011, 06/11/2011, 06/04/2011.  Findings: Ventricular system normal in size and appearance for age. No mass lesion.  No midline shift.  No acute hemorrhage or hematoma.  No extra-axial fluid collections.  No evidence of acute infarction.  No focal brain parenchymal abnormalities.  No significant interval change.  No focal osseous abnormalities involving the  skull.  Hyperostosis frontalis interna.  Visualized paranasal sinuses, mastoid air cells, and middle ear cavities well-aerated.  IMPRESSION: Normal and stable examination.  Original Report Authenticated By: Arnell Sieving, M.D.      1. Seizure       MDM  Pt back to baseline now.  Refusing labs.  Wants to go home.  Likely had seizure.  Advised to f/u with her neurologist.  Advised to have her urine rechecked.        Rolan Bucco, MD 03/07/12 [phone]

## 2012-03-07 NOTE — ED Notes (Signed)
Patient refused blood draw for labs.RN Donell Beers made aware

## 2012-03-07 NOTE — ED Notes (Addendum)
Pt brought in per EMS S/E while standing in [place] line pos loc denies any injuries withness by friend denies dizziness and /or Sz

## 2012-03-07 NOTE — Discharge Instructions (Signed)
Epilepsy  A seizure (convulsion) is a sudden change in brain function that causes a change in behavior, muscle activity, or ability to remain awake and alert. If a person has recurring seizures, this is called epilepsy.  CAUSES   Epilepsy is a disorder with many possible causes. Anything that disturbs the normal pattern of brain cell activity can lead to seizures. Seizure can be caused from illness to brain damage to abnormal brain development. Epilepsy may develop because of:   An abnormality in brain wiring.   An imbalance of nerve signaling chemicals (neurotransmitters).   Some combination of these factors.  Scientists are learning an increasing amount about genetic causes of seizures.  SYMPTOMS   The symptoms of a seizure can vary greatly from one person to another. These may include:   An aura, or warning that tells a person they are about to have a seizure.   Abnormal sensations, such as abnormal smell or seeing flashing lights.   Sudden, general body stiffness.   Rhythmic jerking of the face, arm, or leg - on one or both sides.   Sudden change in consciousness.   The person may appear to be awake but not responding.   They may appear to be asleep but cannot be awakened.   Grimacing, chewing, lip smacking, or drooling.   Often there is a period of sleepiness after a seizure.  DIAGNOSIS   The description you give to your caregiver about what you experienced will help them understand your problems. Equally important is the description by any witnesses to your seizure. A physical exam, including a detailed neurological exam, is necessary. An EEG (electroencephalogram) is a painless test of your brain waves. In this test a diagram is created of your brain waves. These diagrams can be interpreted by a specialist. Pictures of your brain are usually taken with:   An MRI.   A CT scan.  Lab tests may be done to look for:   Signs of infection.   Abnormal blood chemistry.  PREVENTION   There is no way to  prevent the development of epilepsy. If you have seizures that are typically triggered by an event (such as flashing lights), try to avoid the trigger. This can help you avoid a seizure.   PROGNOSIS   Most people with epilepsy lead outwardly normal lives. While epilepsy cannot currently be cured, for some people it does eventually go away. Most seizures do not cause brain damage. It is not uncommon for people with epilepsy, especially children, to develop behavioral and emotional problems. These problems are sometimes the consequence of medicine for seizures or social stress. For some people with epilepsy, the risk of seizures restricts their independence and recreational activities. For example, some states refuse drivers licenses to people with epilepsy.  Most women with epilepsy can become pregnant. They should discuss their epilepsy and the medicine they are taking with their caregivers. Women with epilepsy have a 90 percent or better chance of having a normal, healthy baby.  RISKS AND COMPLICATIONS   People with epilepsy are at increased risk of falls, accidents, and injuries. People with epilepsy are at special risk for two life-threatening conditions. These are status epilepticus and sudden unexplained death (extremely rare). Status epilepticus is a long lasting, continuous seizure that is a medical emergency.  TREATMENT   Once epilepsy is diagnosed, it is important to begin treatment as soon as possible. For about 80 percent of those diagnosed with epilepsy, seizures can be controlled with modern medicines   and surgical techniques. Some antiepileptic drugs can interfere with the effectiveness of oral contraceptives. In 1997, the FDA approved a pacemaker for the brain the (vagus nerve stimulator). This stimulator can be used for people with seizures that are not well-controlled by medicine. Studies have shown that in some cases, children may experience fewer seizures if they maintain a strict diet. The strict  diet is called the ketogenic diet. This diet is rich in fats and low in carbohydrates.  HOME CARE INSTRUCTIONS    Your caregiver will make recommendations about driving and safety in normal activities. Follow these carefully.   Take any medicine prescribed exactly as directed.   Do any blood tests requested to monitor the levels of your medicine.   The people you live and work with should know that you are prone to seizures. They should receive instructions on how to help you. In general, a witness to a seizure should:   Cushion your head and body.   Turn you on your side.   Avoid unnecessarily restraining you.   Not place anything inside your mouth.   Call for local emergency medical help if there is any question about what has occurred.   Keep a seizure diary. Record what you recall about any seizure, especially any possible trigger.   If your caregiver has given you a follow-up appointment, it is very important to keep that appointment. Not keeping the appointment could result in permanent injury and disability. If there is any problem keeping the appointment, you must call back to this facility for assistance.  SEEK MEDICAL CARE IF:    You develop signs of infection or other illness. This might increase the risk of a seizure.   You seem to be having more frequent seizures.   Your seizure pattern is changing.  SEEK IMMEDIATE MEDICAL CARE IF:    A seizure does not stop after a few moments.   A seizure causes any difficulty in breathing.   A seizure results in a very severe headache.   A seizure leaves you with the inability to speak or use a part of your body.  MAKE SURE YOU:    Understand these instructions.   Will watch your condition.   Will get help right away if you are not doing well or get worse.  Document Released: 10/05/2005 Document Revised: 09/24/2011 Document Reviewed: 05/11/2008  [employer] Patient Information 2012 [employer].

## 2012-03-07 NOTE — ED Notes (Signed)
Pt refusing lab draw EDMD aware

## 2012-03-30 ENCOUNTER — Emergency Department (HOSPITAL_COMMUNITY)
Admission: EM | Admit: 2012-03-30 | Discharge: 2012-03-31 | Disposition: A | Discharge status: Home / Self Care | Payer: Medicare Other | Attending: Emergency Medicine | Admitting: Emergency Medicine

## 2012-03-30 ENCOUNTER — Encounter (HOSPITAL_COMMUNITY): Payer: Self-pay | Admitting: Emergency Medicine

## 2012-03-30 ENCOUNTER — Emergency Department (HOSPITAL_COMMUNITY): Payer: Medicare Other

## 2012-03-30 DIAGNOSIS — M79609 Pain in unspecified limb: Secondary | ICD-10-CM | POA: Insufficient documentation

## 2012-03-30 DIAGNOSIS — M79646 Pain in unspecified finger(s): Secondary | ICD-10-CM

## 2012-03-30 DIAGNOSIS — M7989 Other specified soft tissue disorders: Secondary | ICD-10-CM | POA: Insufficient documentation

## 2012-03-30 DIAGNOSIS — M25519 Pain in unspecified shoulder: Secondary | ICD-10-CM | POA: Insufficient documentation

## 2012-03-30 DIAGNOSIS — Z79899 Other long term (current) drug therapy: Secondary | ICD-10-CM | POA: Insufficient documentation

## 2012-03-30 DIAGNOSIS — F319 Bipolar disorder, unspecified: Secondary | ICD-10-CM | POA: Insufficient documentation

## 2012-03-30 DIAGNOSIS — R51 Headache: Secondary | ICD-10-CM

## 2012-03-30 MED ORDER — DEXAMETHASONE 4 MG PO TABS
10.0000 mg | ORAL_TABLET | Freq: Once | ORAL | Status: AC
  Administered 2012-03-30: 10 mg via ORAL
  Filled 2012-03-30 (×3): qty 1

## 2012-03-30 MED ORDER — METOCLOPRAMIDE HCL 10 MG PO TABS
20.0000 mg | ORAL_TABLET | Freq: Once | ORAL | Status: AC
  Administered 2012-03-30: 20 mg via ORAL
  Filled 2012-03-30 (×2): qty 2

## 2012-03-30 MED ORDER — NAPROXEN 500 MG PO TABS
500.0000 mg | ORAL_TABLET | Freq: Once | ORAL | Status: AC
  Administered 2012-03-30: 500 mg via ORAL
  Filled 2012-03-30 (×2): qty 1

## 2012-03-30 MED ORDER — DIPHENHYDRAMINE HCL 25 MG PO CAPS: 25.0000 mg | ORAL_CAPSULE | Freq: Four times a day (QID) | ORAL | Status: DC | PRN

## 2012-03-30 NOTE — ED Notes (Signed)
Pt stated that she had a seizure last Tuesday but has not had one since. She stated that she has been having a constant generalized headache for a couple of days. Unable to give specifics to the amount of time. She said it sometimes makes her nauseated and have blurred vision. No weakness or any other neurological deficits. She also stated that her right index finger has been hurting and left shoulder has been hurting, starting today. Slight swelling to index finger. Unknown injury or trauma. Brisk capillary refill. Slightly cool to touch. Pt is able to move finger. Pt is able to lift and rotate shoulder. Again, unknown injury or trauma.

## 2012-03-30 NOTE — ED Notes (Signed)
EDP at bedside  

## 2012-03-30 NOTE — ED Notes (Signed)
Pt c/o HA and nausea and not feeling well today; pt sts hx of seizures and not sure if she had a seizure today; pt sts pain in right 2nd digit with unknown reason

## 2012-03-30 NOTE — ED Provider Notes (Signed)
History     CSN: [id number]  Arrival date & time 03/30/12  1711   First MD Initiated Contact with Patient 03/30/12 2217      Chief Complaint  Patient presents with  . Headache  . Nausea  . Finger Injury    (Consider location/radiation/quality/duration/timing/severity/associated sxs/prior treatment) HPI  31 year-old female history of seizures presents one week after her last seizure.  Since that time she has had some left shoulder pain which has been constant and worse with movement, without any weakness or numbness.  Swollen right pointer finger.  She is unsure white is swollen there she denies any trauma or recent seizures. Is been swollen for 24 hours.  Denies fever or chills.  Her other complaint is headache she's had for 3 days. It is frontal, throbbing, with extension into her occiput bilaterally. In is history of migraines. Denies weakness numbness, vomiting, diarrhea, fever, chills, confusion, difficulty speaking, difficulty understanding speech. Call illness moderate. Nothing makes any of her symptoms better or worse. She's been taking nothing for her headache. Her vital signs were normal on arrival. Past Medical History  Diagnosis Date  . Seizures   . Bipolar affective disorder   . Seizure   . Seizures   . Seizures   . Migraines     History reviewed. No pertinent past surgical history.  History reviewed. No pertinent family history.  History  Substance Use Topics  . Smoking status: Never Smoker   . Smokeless tobacco: Not on file  . Alcohol Use: No    OB History    Grav Para Term Preterm Abortions TAB SAB Ect Mult Living                  Review of Systems Constitutional: Negative for fever and chills.  POS HA.  HENT: Negative for ear pain, sore throat and trouble swallowing.   Eyes: Negative for pain and visual disturbance.  Respiratory: Negative for cough and shortness of breath.   Cardiovascular: Negative for chest pain and leg swelling.    Gastrointestinal: Negative for nausea, vomiting, abdominal pain and diarrhea.  Genitourinary: Negative for dysuria, urgency and frequency.  Musculoskeletal: Negative for back pain and joint swelling.  Skin: Negative for rash and wound.  Neurological: Negative for dizziness, syncope, speech difficulty, weakness and numbness.    Allergies  Review of patient's allergies indicates no known allergies.  Home Medications   Current Outpatient Rx  Name Route Sig Dispense Refill  . LEVETIRACETAM 1000 MG PO TABS Oral Take 1,000 mg by mouth 2 (two) times daily.     . OXYCODONE-ACETAMINOPHEN 5-325 MG PO TABS Oral Take 1 tablet by mouth every 4 (four) hours as needed. Pain relief    . TOPIRAMATE 50 MG PO TABS Oral Take 150 mg by mouth at bedtime.     Marland Kitchen LEVETIRACETAM 1000 MG PO TABS Oral Take 2 tablets (2,000 mg total) by mouth 2 (two) times daily. 120 tablet 0    BP 132/94  Pulse 82  Temp 98 F (36.7 C) (Oral)  Resp 18  SpO2 98%  LMP 03/07/2012  Physical Exam Consitutional: Pt in no acute distress.   Head: Normocephalic and atraumatic.  Eyes: Extraocular motion intact, no scleral icterus Neck: Supple without meningismus, mass, or overt JVD Respiratory: Effort normal and breath sounds normal. No respiratory distress. CV: Heart regular rate and rhythm, no obvious murmurs.  Pulses +2 and symmetric Abdomen: Soft, non-tender, non-distended MSK: Left shoulder: Very mild tenderness to palpation anterior glenohumeral joint.  No deformity. Not consistent with dislocation. Normal radial pulses at bilateral wrists. Strength in both hands maintained. Sensation of both hands maintained. No pain with shoulder abduction. Otherwise extremities are atraumatic without deformity, ROM intact Skin: Warm, dry, intact Neuro: Alert and oriented, no motor deficit noted.   PERRL.  EOMI. Reflexes normal BLE, no clonus.  Hips, knee and ankle, arm and wrist strength maintained.  FTN and RAM normal.  CNs 2-12 normal.   Psychiatric: Mood and affect are normal    ED Course  Procedures (including critical care time)  Labs Reviewed - No data to display Dg Finger Index Right  03/30/2012  *RADIOLOGY REPORT*  Clinical Data: Fall with swelling at distal portion of finger.  RIGHT INDEX FINGER 2+V  Comparison: None.  Findings: There is subtle osseous irregularity of the volar aspect of the proximal portion of the middle phalanx.  No dislocation.  IMPRESSION: Subtle osseous irregularity about the proximal portion of the middle phalanx.  Correlate with point tenderness, as a volar plate fracture cannot be excluded.  Of note, the history describes more distal swelling.  Original Report Authenticated By: Consuello Bossier, M.D.     1. Shoulder pain   2. Headache   3. Finger pain       MDM  Headache: No red flags. Gradual onset. Mild nausea suggesting migraine. No weakness or numbness or paresthesias. Will treat with by mouth headache cocktail.  Shoulder with no meaningful focal tenderness. Range of motion is maintained strength is maintained. We'll suggest NSAID medications for relief as the patient has been taking nothing.  We will image right index finger.  We'll treat migraines with cocktail.  Headache unrelieved by cocktail. Given her right finger left shoulder and headache, will give opioid pain medication intramuscular.  Finger imaging findings not consistent with physical exam.  We'll not treat finger.  Patient feels better after pain medication.  PT DC home stable.  Discussed with pt the clinical impression, treatment in the ED, and follow up plan.  We alslo discussed the indications for returning to the ED, which include shortness or breath, confusion, fever, new weakness or numbness, chest pain, or any other concerning symptom.  The pt understood the treatment and plan, is stable, and is able to leave the ED.            Larrie Kass, MD 03/31/12 0100

## 2012-03-31 MED ORDER — HYDROMORPHONE HCL PF 1 MG/ML IJ SOLN
1.0000 mg | Freq: Once | INTRAMUSCULAR | Status: AC
  Administered 2012-03-31: 1 mg via INTRAMUSCULAR
  Filled 2012-03-31: qty 1

## 2012-03-31 MED ORDER — LEVETIRACETAM 1000 MG PO TABS: 2000.0000 mg | ORAL_TABLET | Freq: Two times a day (BID) | ORAL | Status: DC

## 2012-03-31 NOTE — ED Notes (Signed)
Food given to pt.Okayed by EDP

## 2012-03-31 NOTE — Discharge Instructions (Signed)
Follow up with your providers as dicussed in the ED today and as written above.  See your doctor immediately--or return to the ED--with any new or troubling symptoms including fevers, weakness, new chest pain, shortness or breath, numbness, or any other concerning symptom.    Headache, General, Unknown Cause  The specific cause of your headache may not have been found today. There are many causes and types of headache. A few common ones are:  Tension headache.   Migraine.   Infections (examples: dental and sinus infections).   Bone and/or joint problems in the neck or jaw.   Depression.   Eye problems.  These headaches are not life threatening.  Headaches can sometimes be diagnosed by a patient history and a physical exam. Sometimes, lab and imaging studies (such as x-ray and/or CT scan) are used to rule out more serious problems. In some cases, a spinal tap (lumbar puncture) may be requested. There are many times when your exam and tests may be normal on the first visit even when there is a serious problem causing your headaches. Because of that, it is very important to follow up with your doctor or local clinic for further evaluation. FINDING OUT THE RESULTS OF TESTS  If a radiology test was performed, a radiologist will review your results.   You will be contacted by the emergency department or your physician if any test results require a change in your treatment plan.   Not all test results may be available during your visit. If your test results are not back during the visit, make an appointment with your caregiver to find out the results. Do not assume everything is normal if you have not heard from your caregiver or the medical facility. It is important for you to follow up on all of your test results.  HOME CARE INSTRUCTIONS   Keep follow-up appointments with your caregiver, or any specialist referral.   Only take over-the-counter or prescription medicines for pain, discomfort,  or fever as directed by your caregiver.   Biofeedback, massage, or other relaxation techniques may be helpful.   Ice packs or heat applied to the head and neck can be used. Do this three to four times per day, or as needed.   Call your doctor if you have any questions or concerns.   If you smoke, you should quit.  SEEK MEDICAL CARE IF:   You develop problems with medications prescribed.   You do not respond to or obtain relief from medications.   You have a change from the usual headache.   You develop nausea or vomiting.  SEEK IMMEDIATE MEDICAL CARE IF:   If your headache becomes severe.   You have an unexplained oral temperature above 102 F (38.9 C), or as your caregiver suggests.   You have a stiff neck.   You have loss of vision.   You have muscular weakness.   You have loss of muscular control.   You develop severe symptoms different from your first symptoms.   You start losing your balance or have trouble walking.   You feel faint or pass out.  MAKE SURE YOU:   Understand these instructions.   Will watch your condition.   Will get help right away if you are not doing well or get worse.  Document Released: 10/05/2005 Document Revised: 09/24/2011 Document Reviewed: 05/24/2008 [employer] Patient Information 2012 [employer]. 

## 2012-03-31 NOTE — ED Notes (Signed)
Gave pt [place]. Okayed by EDP

## 2012-03-31 NOTE — ED Notes (Signed)
EDP at bedside  

## 2012-04-01 NOTE — ED Provider Notes (Signed)
I saw and evaluated the patient, reviewed the resident's note and I agree with the findings and plan. Patient with headache with history of same. She's not had recent headaches though. Also shoulder and finger pain and a negative x-ray. She was discharged home  Juliet Rude. Rubin Payor, MD 04/01/12 [phone]

## 2012-04-29 ENCOUNTER — Emergency Department (HOSPITAL_COMMUNITY): Payer: Medicare Other

## 2012-04-29 ENCOUNTER — Encounter (HOSPITAL_COMMUNITY): Payer: Self-pay

## 2012-04-29 ENCOUNTER — Emergency Department (HOSPITAL_COMMUNITY)
Admission: EM | Admit: 2012-04-29 | Discharge: 2012-04-30 | Disposition: A | Discharge status: Home / Self Care | Payer: Medicare Other | Attending: Emergency Medicine | Admitting: Emergency Medicine

## 2012-04-29 DIAGNOSIS — Y92009 Unspecified place in unspecified non-institutional (private) residence as the place of occurrence of the external cause: Secondary | ICD-10-CM | POA: Insufficient documentation

## 2012-04-29 DIAGNOSIS — O2690 Pregnancy related conditions, unspecified, unspecified trimester: Secondary | ICD-10-CM | POA: Insufficient documentation

## 2012-04-29 DIAGNOSIS — F319 Bipolar disorder, unspecified: Secondary | ICD-10-CM | POA: Insufficient documentation

## 2012-04-29 DIAGNOSIS — S0083XA Contusion of other part of head, initial encounter: Secondary | ICD-10-CM

## 2012-04-29 DIAGNOSIS — S0003XA Contusion of scalp, initial encounter: Secondary | ICD-10-CM | POA: Insufficient documentation

## 2012-04-29 DIAGNOSIS — R569 Unspecified convulsions: Secondary | ICD-10-CM | POA: Insufficient documentation

## 2012-04-29 DIAGNOSIS — Z331 Pregnant state, incidental: Secondary | ICD-10-CM

## 2012-04-29 DIAGNOSIS — S1093XA Contusion of unspecified part of neck, initial encounter: Secondary | ICD-10-CM | POA: Insufficient documentation

## 2012-04-29 DIAGNOSIS — W19XXXA Unspecified fall, initial encounter: Secondary | ICD-10-CM | POA: Insufficient documentation

## 2012-04-29 LAB — BASIC METABOLIC PANEL
BUN: 6 mg/dL (ref 6–23)
CO2: 19 mEq/L (ref 19–32)
Calcium: 8.9 mg/dL (ref 8.4–10.5)
Chloride: 102 mEq/L (ref 96–112)
Creatinine, Ser: 0.72 mg/dL (ref 0.50–1.10)
GFR calc Af Amer: >90 mL/min (ref >90)
GFR calc non Af Amer: >90 mL/min (ref >90)
Glucose, Bld: 101 mg/dL — ABNORMAL HIGH (ref 70–99)
Potassium: 3.4 mEq/L — ABNORMAL LOW (ref 3.5–5.1)
Sodium: 132 mEq/L — ABNORMAL LOW (ref 135–145)

## 2012-04-29 LAB — URINALYSIS, ROUTINE W REFLEX MICROSCOPIC
Bilirubin Urine: NEGATIVE
Glucose, UA: NEGATIVE mg/dL
Hgb urine dipstick: NEGATIVE
Ketones, ur: NEGATIVE mg/dL
Leukocytes, UA: NEGATIVE
Nitrite: NEGATIVE
Protein, ur: NEGATIVE mg/dL
Specific Gravity, Urine: 1.023 (ref 1.005–1.030)
Urobilinogen, UA: 0.2 mg/dL (ref 0.0–1.0)
pH: 7 (ref 5.0–8.0)

## 2012-04-29 LAB — CBC
HCT: 36.3 % (ref 36.0–46.0)
Hemoglobin: 11.4 g/dL — ABNORMAL LOW (ref 12.0–15.0)
MCH: 22.4 pg — ABNORMAL LOW (ref 26.0–34.0)
MCHC: 31.4 g/dL (ref 30.0–36.0)
MCV: 71.5 fL — ABNORMAL LOW (ref 78.0–100.0)
Platelets: 460 10*3/uL — ABNORMAL HIGH (ref 150–400)
RBC: 5.08 MIL/uL (ref 3.87–5.11)
RDW: 17.4 % — ABNORMAL HIGH (ref 11.5–15.5)
WBC: 8.3 10*3/uL (ref 4.0–10.5)

## 2012-04-29 LAB — PREGNANCY, URINE: Preg Test, Ur: POSITIVE — AB

## 2012-04-29 MED ORDER — OXYCODONE-ACETAMINOPHEN 5-325 MG PO TABS
2.0000 | ORAL_TABLET | Freq: Once | ORAL | Status: AC
  Administered 2012-04-29: 2 via ORAL
  Filled 2012-04-29: qty 2

## 2012-04-29 NOTE — ED Notes (Signed)
Pt states she is unable to urinate at this time.

## 2012-04-29 NOTE — ED Notes (Addendum)
Pt states she was cooking around 6pm tonight.  Next thing she remembers is waking up around 8pm and all the food was on the floor.  She has hx of seizures.  No incontinence but did have minor tongue trama.  She has pain and swelling to LT temple where she thinks she fell onto.  Has been taking her keppra and topamax as prescribed.  States she never knows when she will have a seizure and she has them monthly.

## 2012-04-30 NOTE — ED Provider Notes (Signed)
History    31 year old femalemale presenting for likely seizure. Patient states that she woke up on the ground in her kitchen. Felt groggy and confused. History of seizure disorder. Reports compliance with her medications. Last seizure was about a week ago and prior to that about a month ago. She states that she typically has about one seizure per minute. No recent medication changes. No fevers. Denies ingestion. She thinks she struck the left side of her head she has some pain there. Currently that feels back to her baseline. No numbness, tingling or loss of strength. No acute visual complaitns.  CSN: [id number]  Arrival date & time 04/29/12  2046   First MD Initiated Contact with Patient 04/29/12 2102      Chief Complaint  Patient presents with  . Seizures    (Consider location/radiation/quality/duration/timing/severity/associated sxs/prior treatment) HPI  Past Medical History  Diagnosis Date  . Seizures   . Bipolar affective disorder   . Seizure   . Seizures   . Seizures   . Migraines     Past Surgical History  Procedure Date  . Cesarean section     History reviewed. No pertinent family history.  History  Substance Use Topics  . Smoking status: Never Smoker   . Smokeless tobacco: Not on file  . Alcohol Use: No    OB History    Grav Para Term Preterm Abortions TAB SAB Ect Mult Living                  Review of Systems   Review of symptoms negative unless otherwise noted in HPI.   Allergies  Review of patient's allergies indicates no known allergies.  Home Medications   Current Outpatient Rx  Name Route Sig Dispense Refill  . LEVETIRACETAM 1000 MG PO TABS Oral Take 1,000 mg by mouth 2 (two) times daily.     . TOPIRAMATE 50 MG PO TABS Oral Take 150 mg by mouth at bedtime.      BP 137/98  Pulse 88  Temp 98.8 F (37.1 C) (Oral)  Resp 18  SpO2 100%  LMP 03/28/2012  Physical Exam  Nursing note and vitals reviewed. Constitutional: She appears  well-developed and well-nourished. No distress.       Laying in bed. NAD. Obese.  HENT:  Head: Normocephalic.       Abrasion and tenderness L temporal region  Eyes: Conjunctivae and EOM are normal. Pupils are equal, round, and reactive to light. Right eye exhibits no discharge. Left eye exhibits no discharge.  Neck: Neck supple.  Cardiovascular: Normal rate, regular rhythm and normal heart sounds.  Exam reveals no gallop and no friction rub.   No murmur heard. Pulmonary/Chest: Effort normal and breath sounds normal. No respiratory distress.  Abdominal: Soft. She exhibits no distension. There is no tenderness.  Musculoskeletal: She exhibits no edema and no tenderness.       No midline spinal tenderness  Neurological: She is alert.  Skin: Skin is warm and dry.  Psychiatric: She has a normal mood and affect. Her behavior is normal. Thought content normal.    ED Course  Procedures (including critical care time)  Labs Reviewed  BASIC METABOLIC PANEL - Abnormal; Notable for the following:    Sodium 132 (*)     Potassium 3.4 (*)     Glucose, Bld 101 (*)     All other components within normal limits  URINALYSIS, ROUTINE W REFLEX MICROSCOPIC - Abnormal; Notable for the following:    APPearance  CLOUDY (*)     All other components within normal limits  PREGNANCY, URINE - Abnormal; Notable for the following:    Preg Test, Ur POSITIVE (*)     All other components within normal limits  CBC - Abnormal; Notable for the following:    Hemoglobin 11.4 (*)     MCV 71.5 (*)     MCH 22.4 (*)     RDW 17.4 (*)     Platelets 460 (*)     All other components within normal limits   No results found.  EKG:  Rhythm: nsr Rate: 80 Axis: normal Intervals: q waves in III ST segments: NS ST changes. Flipped t waves in lead III   1. Seizure   2. Facial contusion   3. Incidental pregnancy       MDM  30yF with seizure. Nonfocal exam. Back to baseline. Incidentally noted to be pregnant. LMP 1  month ago. Outpt OB fu. Outpt neurology fu.        Raeford Razor, MD 05/05/12 Jacinta Shoe

## 2012-05-01 ENCOUNTER — Inpatient Hospital Stay (HOSPITAL_COMMUNITY)
Admission: AD | Admit: 2012-05-01 | Discharge: 2012-05-01 | Disposition: A | Discharge status: Home / Self Care | Payer: Medicare Other | Source: Ambulatory Visit | Attending: Obstetrics & Gynecology | Admitting: Obstetrics & Gynecology

## 2012-05-01 ENCOUNTER — Encounter (HOSPITAL_COMMUNITY): Payer: Self-pay | Admitting: *Deleted

## 2012-05-01 DIAGNOSIS — Z87898 Personal history of other specified conditions: Secondary | ICD-10-CM

## 2012-05-01 DIAGNOSIS — O210 Mild hyperemesis gravidarum: Secondary | ICD-10-CM | POA: Insufficient documentation

## 2012-05-01 DIAGNOSIS — Z3490 Encounter for supervision of normal pregnancy, unspecified, unspecified trimester: Secondary | ICD-10-CM

## 2012-05-01 DIAGNOSIS — Z711 Person with feared health complaint in whom no diagnosis is made: Secondary | ICD-10-CM

## 2012-05-01 DIAGNOSIS — R51 Headache: Secondary | ICD-10-CM | POA: Insufficient documentation

## 2012-05-01 LAB — URINALYSIS, ROUTINE W REFLEX MICROSCOPIC
Hgb urine dipstick: NEGATIVE
Nitrite: NEGATIVE
Protein, ur: NEGATIVE mg/dL
Specific Gravity, Urine: 1.015 (ref 1.005–1.030)
Urobilinogen, UA: 0.2 mg/dL (ref 0.0–1.0)

## 2012-05-01 LAB — URINE MICROSCOPIC-ADD ON

## 2012-05-01 MED ORDER — ACETAMINOPHEN 325 MG PO TABS
650.0000 mg | ORAL_TABLET | Freq: Once | ORAL | Status: AC
  Administered 2012-05-01: 325 mg via ORAL

## 2012-05-01 NOTE — MAU Provider Note (Signed)
History     CSN: [id number]  Arrival date and time: 05/01/12 1541   First Provider Initiated Contact with Patient 05/01/12 1616      Chief Complaint  Patient presents with  . Morning Sickness  . Headache   HPI [name] [name] is 31 y.o. G2P1 [redacted]w[redacted]d weeks presenting with inability to keep fluids down.  Went to the ER 2 days ago after having a seizure, had + UPT there.  She was instructed to discontinue her seizure medication, Kepra and Topamax.  She is worried about not being on her medications.  Denies vomiting but is nauseated.  Is surprised with + UPT, has used contraception on and off for 9 years without a pregnancy.  Has not been on any contraception in years.  She is hot and cold, headache, back pain, swelling in feet.   Denies vaginal bleeding or abnormal discharge.  Has 1 sexual partner.  She is not sure where she will get prenatal care.     Past Medical History  Diagnosis Date  . Seizures   . Bipolar affective disorder   . Seizure   . Seizures   . Seizures   . Migraines     Past Surgical History  Procedure Date  . Cesarean section     Family History  Problem Relation Age of Onset  . Other Neg Hx     History  Substance Use Topics  . Smoking status: Never Smoker   . Smokeless tobacco: Not on file  . Alcohol Use: No    Allergies: No Known Allergies  Prescriptions prior to admission  Medication Sig Dispense Refill  . levETIRAcetam (KEPPRA) 1000 MG tablet Take 1,000 mg by mouth 2 (two) times daily.       Marland Kitchen topiramate (TOPAMAX) 50 MG tablet Take 150 mg by mouth at bedtime.        Review of Systems  Gastrointestinal: Positive for nausea. Negative for vomiting.  Genitourinary:       Negative for bleeding or discharge  Neurological: Positive for headaches.   Physical Exam   Blood pressure 127/87, pulse 103, temperature 99.1 F (37.3 C), temperature source Oral, resp. rate 18, height 5\' 1"  (1.549 m), weight 337 lb 6.4 oz (153.044 kg), last menstrual period  03/28/2012.  Physical Exam  Constitutional: She is oriented to person, place, and time. She appears well-developed and well-nourished. No distress.  HENT:  Head: Normocephalic.  Neck: Normal range of motion.  Cardiovascular: Normal rate.   Respiratory: Effort normal.  GI: Soft. She exhibits no distension and no mass. There is no tenderness. There is no rebound and no guarding.  Genitourinary:       Not indicated   Neurological: She is alert and oriented to person, place, and time.  Skin: Skin is warm and dry.  Psychiatric: She has a normal mood and affect. Her behavior is normal.  \ Results for orders placed during the hospital encounter of 05/01/12 (from the past 24 hour(s))  URINALYSIS, ROUTINE W REFLEX MICROSCOPIC     Status: Abnormal   Collection Time   05/01/12  3:55 PM      Component Value Range   Color, Urine YELLOW  YELLOW   APPearance CLOUDY (*) CLEAR   Specific Gravity, Urine 1.015  1.005 - 1.030   pH 7.0  5.0 - 8.0   Glucose, UA NEGATIVE  NEGATIVE mg/dL   Hgb urine dipstick NEGATIVE  NEGATIVE   Bilirubin Urine NEGATIVE  NEGATIVE   Ketones, ur NEGATIVE  NEGATIVE  mg/dL   Protein, ur NEGATIVE  NEGATIVE mg/dL   Urobilinogen, UA 0.2  0.0 - 1.0 mg/dL   Nitrite NEGATIVE  NEGATIVE   Leukocytes, UA TRACE (*) NEGATIVE  URINE MICROSCOPIC-ADD ON     Status: Abnormal   Collection Time   05/01/12  3:55 PM      Component Value Range   Squamous Epithelial / LPF MANY (*) RARE   WBC, UA 0-2  <3 WBC/hpf   RBC / HPF 0-2  <3 RBC/hpf   Bacteria, UA FEW (*) RARE   Urine-Other MUCOUS PRESENT      MAU Course  Procedures  MDM 16:40  Reported MSE, HPI, and UA with Dr. Despina Hidden.   Order given to have patient restart medications, schedule U/S in 2 weeks .  Dr. Despina Hidden feels her sxs may be related to being off her medications for 2 days and pregnancy Will give Tylenol 650mg  po now for headache Ultrasound scheduled for 7/29 at 8am Assessment and Plan  A:  Worried      + UPT     Hx of  seizure-told to d/c meds  P:  Instructed patient to restart medications for seizures     U/S scheduled for  7/29 at 8am  [name],EVE M 05/01/2012, 4:20 PM

## 2012-05-01 NOTE — MAU Note (Signed)
Pt on Topamax for seizures. Went to [facility] for siezures and facila contusion and was tols whe was pregnant. They told her to stop taking her siezure medication. Since thsn she has been having headache and nausea. (topamax helped with her migraine control).

## 2012-05-10 ENCOUNTER — Emergency Department (HOSPITAL_COMMUNITY)
Admission: AD | Admit: 2012-05-10 | Discharge: 2012-05-11 | Disposition: A | Discharge status: Home / Self Care | Payer: Medicare Other | Source: Ambulatory Visit | Attending: Emergency Medicine | Admitting: Emergency Medicine

## 2012-05-10 DIAGNOSIS — M25512 Pain in left shoulder: Secondary | ICD-10-CM

## 2012-05-10 DIAGNOSIS — M25519 Pain in unspecified shoulder: Secondary | ICD-10-CM | POA: Insufficient documentation

## 2012-05-10 DIAGNOSIS — G40909 Epilepsy, unspecified, not intractable, without status epilepticus: Secondary | ICD-10-CM

## 2012-05-10 NOTE — MAU Note (Signed)
Pt reports she was at home on the phone with her friend and she "passed out" and then EMS arrives. EMS reports pt answered the door and was coherent and ambulatory with out difficulty or assist. Pt was home alone at the time. Pt reports a history of seizures and is [redacted] weeks pregnant. Pt reports a fall on July 12 and hurt her left arm and reports arm is hurting now.

## 2012-05-11 ENCOUNTER — Encounter (HOSPITAL_COMMUNITY): Payer: Self-pay | Admitting: *Deleted

## 2012-05-11 ENCOUNTER — Emergency Department (HOSPITAL_COMMUNITY): Payer: Medicare Other

## 2012-05-11 DIAGNOSIS — G40909 Epilepsy, unspecified, not intractable, without status epilepticus: Secondary | ICD-10-CM

## 2012-05-11 LAB — CBC
MCH: 22.4 pg — ABNORMAL LOW (ref 26.0–34.0)
MCV: 73 fL — ABNORMAL LOW (ref 78.0–100.0)
Platelets: 402 10*3/uL — ABNORMAL HIGH (ref 150–400)
RDW: 17.9 % — ABNORMAL HIGH (ref 11.5–15.5)
WBC: 10.7 10*3/uL — ABNORMAL HIGH (ref 4.0–10.5)

## 2012-05-11 MED ORDER — LACTATED RINGERS IV SOLN: INTRAVENOUS | Status: DC

## 2012-05-11 MED ORDER — SODIUM CHLORIDE 0.9 % IV SOLN
1000.0000 mg | Freq: Once | INTRAVENOUS | Status: AC
  Administered 2012-05-11: 1000 mg via INTRAVENOUS
  Filled 2012-05-11: qty 10

## 2012-05-11 MED ORDER — LORAZEPAM 2 MG/ML IJ SOLN: 2.0000 mg | Freq: Once | INTRAMUSCULAR | Status: DC

## 2012-05-11 MED ORDER — LEVETIRACETAM 1000 MG PO TABS: 1000.0000 mg | ORAL_TABLET | Freq: Two times a day (BID) | ORAL | Status: DC

## 2012-05-11 MED ORDER — LORAZEPAM 2 MG/ML IJ SOLN
INTRAMUSCULAR | Status: AC
  Filled 2012-05-11: qty 1

## 2012-05-11 MED ORDER — OXYCODONE-ACETAMINOPHEN 5-325 MG PO TABS
2.0000 | ORAL_TABLET | Freq: Once | ORAL | Status: AC
  Administered 2012-05-11: 2 via ORAL
  Filled 2012-05-11: qty 2

## 2012-05-11 MED ORDER — LORAZEPAM 2 MG/ML IJ SOLN: 1.0000 mg | Freq: Once | INTRAMUSCULAR | Status: DC

## 2012-05-11 NOTE — MAU Note (Addendum)
Pt found  Seizing in the hallway on a stretcher at 0105. Pt transferred to a bed and attempts made to suction her, O2 intiated. IV attempted x 3 by 3 different nurses. Pt uncooperative, and complaiining of pain to left shoulder pt states she fell on earlier today Pt moved from hallway to Room 10. Pt received Ativan 2 mg IM after attempts at IV failed at 0130. Pt concerned about "FOB". After FOB arrived pt became verbally abusive to him using foul language.05/11/2012 1500

## 2012-05-11 NOTE — ED Notes (Signed)
Pt arrived from [facility] via [facility]-PIV started and blood drawn and held.

## 2012-05-11 NOTE — Progress Notes (Signed)
Pt states she was treated a couple of years ago for anxiety of depression. Pt "cant remember which one"

## 2012-05-11 NOTE — MAU Note (Signed)
Pt states she doesn't know if she had a "seizure or i blacked out but next thing I know I was on a 3 way with 911 and they were at my door"

## 2012-05-11 NOTE — ED Notes (Signed)
Keppra completed.

## 2012-05-11 NOTE — ED Notes (Signed)
Patient transported to X-ray 

## 2012-05-11 NOTE — ED Notes (Signed)
Pt back from XR 

## 2012-05-11 NOTE — MAU Provider Note (Signed)
History     CSN: [id number]  Arrival date and time: 05/10/12 2247   First Provider Initiated Contact with Patient 05/11/12 0017      Chief Complaint  Patient presents with  . Arm Pain   Arm Pain   [name] [name] presents via EMS for eval of loss on consciousness this evening. States she isn't sure over what period of time this must have taken place as she was home alone. States she awoke spontaneously and her left shoulder was sore. Also with headache, but no signs that she struck her head. She states 'this happens all the time.'  Recent documentation shows a similar occurrence on 7/12 for which she was evaluated at [facility] and told to d/c seizure meds (Keppra and Topamax) once her pregnancy was diagnosed. She was then seen at [facility] on 7/14 for headache and told to restart meds and was scheduled for U/S on 7/29. She did not restart her meds (didn't understand that she was supposed to) and doesn't currently have a neurologist as she was dismissed from [facility] for missed appointments.  OB History    Grav Para Term Preterm Abortions TAB SAB Ect Mult Living   2 1        1       Past Medical History  Diagnosis Date  . Seizures   . Bipolar affective disorder   . Seizure   . Seizures   . Seizures   . Migraines     Past Surgical History  Procedure Date  . Cesarean section     Family History  Problem Relation Age of Onset  . Other Neg Hx   . Cancer Mother   . Diabetes Mother   . Hypertension Father     History  Substance Use Topics  . Smoking status: Never Smoker   . Smokeless tobacco: Not on file  . Alcohol Use: No    Allergies: No Known Allergies  Prescriptions prior to admission  Medication Sig Dispense Refill  . levETIRAcetam (KEPPRA) 1000 MG tablet Take 1,000 mg by mouth 2 (two) times daily.       Marland Kitchen topiramate (TOPAMAX) 50 MG tablet Take 150 mg by mouth at bedtime.        ROS Physical Exam   Blood pressure 119/63, pulse 103, temperature 99 F (37.2 C),  temperature source Oral, resp. rate 22, height 5\' 1"  (1.549 m), weight 158.759 kg (350 lb), last menstrual period 03/28/2012, SpO2 99.00%.  Physical Exam  Constitutional: She is oriented to person, place, and time. She appears well-developed and well-nourished.  HENT:  Head: Normocephalic.  Cardiovascular: Normal rate.   Respiratory: Effort normal.  GI:       Soft; obese  Musculoskeletal:       Nl ROM except for right shoulder; tender per pt; no eccymosis/erythema; moves but is sore  Neurological: She is alert and oriented to person, place, and time.  Skin: Skin is warm and dry.  Psychiatric: She has a normal mood and affect. Her behavior is normal. Thought content normal.   CBC    Component Value Date/Time   WBC 10.7* 05/11/2012 0020   RBC 4.86 05/11/2012 0020   HGB 10.9* 05/11/2012 0020   HCT 35.5* 05/11/2012 0020   PLT 402* 05/11/2012 0020   MCV 73.0* 05/11/2012 0020   MCH 22.4* 05/11/2012 0020   MCHC 30.7 05/11/2012 0020   RDW 17.9* 05/11/2012 0020   LYMPHSABS 2.8 12/11/2011 1700   MONOABS 0.3 12/11/2011 1700   EOSABS 0.1  12/11/2011 1700   BASOSABS 0.0 12/11/2011 1700     MAU Course  Procedures  MDM Witnessed seizure in MAU that appeared to be grand mal. Stabilized in room, IV started and Ativan 2mg  given. Report called to [place] MD and RN- to [phone] via [facility].  Assessment and Plan  Early preg Seizures (no meds currently)  Given Percocet x 2 for shoulder pain.  Tx to [facility] for further eval due to seizure in MAU.    SHAW, KIMBERLY 05/11/2012, 12:30 AM

## 2012-05-11 NOTE — ED Notes (Signed)
Contacted MAU. Alisia Ferrari, RN in charge of care for pt reported pt received 2 mg of Ativan at 0130 in R ventrogluteal for 3 minute long witnessed seizure.

## 2012-05-11 NOTE — ED Notes (Addendum)
Report given via [facility]. Pt [redacted] weeks pregnant G2P1 was cooking supper, woke up on the floor, and food all over the place. Pt was on the phone with a friend, friend called EMS. EMS transported pt to [facility]. 10/10 left shoulder pain from fall. Pt received 2 tabs of Percocet for shoulder pain. [facility] No documentation or communication supporting seizure activity or interventions. Reported 2 of Ativan IM, pt reports not getting any, no documentation on occurrence. No IV access available. [facility] call was received at 0122. Initial VS BP 129/80, HR 113, RR 24, O2 99% Room Air.  NKA. Medical hx migraines, bipolar, and seizures. Pt stopped taking Keppra due to pregnancy from [facility] ER. MAU informed her to start taking Keppra again.

## 2012-05-11 NOTE — ED Notes (Signed)
Medicine arrived, but no pumps on the floor. [facility] called. [facility] confirmed they will bring down a brain for my channel.

## 2012-05-11 NOTE — ED Notes (Signed)
[id number]> Expected date:<BR> Expected time:<BR> Means of arrival:<BR> Comments:<BR> [facility] pregnant/seizing

## 2012-05-11 NOTE — ED Notes (Signed)
Boyfriend/father of child at bedside.

## 2012-05-12 NOTE — ED Provider Notes (Signed)
History     CSN: [id number]  Arrival date & time 05/11/12  0240   None     Chief Complaint  Patient presents with  . Seizures    (Consider location/radiation/quality/duration/timing/severity/associated sxs/prior treatment) HPI 31 yo female presents as transfer from [facility] after having seizure.  Pt with seizure disorder, recently dx with pregnancy and reportedly was told to stop taking her seizure medications.  Pt had seizure at home, brought to [facility].  They have scheduled a ultrasound in 2 weeks for pregnancy staging.  Pt had another seizure while being evaluated by the nurse midwife.  It is reported she was given ativan and transferred to [facility] for further treatment.  Pt c/o left shoulder pain after seizures, has history of same in past.  Per nurse midwife, topamax is category B for pregnancy, and should be d/c.  Keppra is category C, but they recommend that she stay on this medication.  Pt reports she has breakthrough seizures frequently even when on her seizure regimen.  Pt is pending follow up with a new neurologist.  No other complaints at this time. Past Medical History  Diagnosis Date  . Seizures   . Bipolar affective disorder   . Seizure   . Seizures   . Seizures   . Migraines     Past Surgical History  Procedure Date  . Cesarean section     Family History  Problem Relation Age of Onset  . Other Neg Hx   . Cancer Mother   . Diabetes Mother   . Hypertension Father     History  Substance Use Topics  . Smoking status: Never Smoker   . Smokeless tobacco: Not on file  . Alcohol Use: No    OB History    Grav Para Term Preterm Abortions TAB SAB Ect Mult Living   2 1        1       Review of Systems  All other systems reviewed and are negative.    Allergies  Review of patient's allergies indicates no known allergies.  Home Medications   Current Outpatient Rx  Name Route Sig Dispense Refill  . TOPIRAMATE 50 MG PO TABS Oral Take 150 mg by mouth  at bedtime.    Marland Kitchen LEVETIRACETAM 1000 MG PO TABS Oral Take 1 tablet (1,000 mg total) by mouth 2 (two) times daily. 60 tablet 0    BP 113/74  Pulse 101  Temp 98.2 F (36.8 C) (Oral)  Resp 18  Ht 5\' 1"  (1.549 m)  Wt 350 lb (158.759 kg)  BMI 66.13 kg/m2  SpO2 95%  LMP 03/28/2012  Physical Exam  Nursing note and vitals reviewed. Constitutional: She is oriented to person, place, and time. She appears well-developed and well-nourished. She appears distressed (uncomfortable appearing, somnolent).       Morbidly obese  HENT:  Head: Normocephalic and atraumatic.  Right Ear: External ear normal.  Left Ear: External ear normal.  Nose: Nose normal.  Mouth/Throat: Oropharynx is clear and moist.  Eyes: Conjunctivae and EOM are normal. Pupils are equal, round, and reactive to light.  Neck: Normal range of motion. Neck supple. No JVD present. No tracheal deviation present. No thyromegaly present.  Cardiovascular: Normal rate, regular rhythm, normal heart sounds and intact distal pulses.  Exam reveals no gallop and no friction rub.   No murmur heard. Pulmonary/Chest: Effort normal and breath sounds normal. No stridor. No respiratory distress. She has no wheezes. She has no rales. She exhibits no  tenderness.  Abdominal: Soft. Bowel sounds are normal. She exhibits no distension and no mass. There is no tenderness. There is no rebound and no guarding.  Musculoskeletal: Normal range of motion. She exhibits tenderness (diffuse tendernes to left shoulder and upper arm.  No deformity noted, but diffcult exam given body habitus.  Normal ROM but pain with active/passive movement). She exhibits no edema.  Lymphadenopathy:    She has no cervical adenopathy.  Neurological: She is oriented to person, place, and time. She has normal reflexes. No cranial nerve deficit. She exhibits normal muscle tone. Coordination normal.  Skin: Skin is dry. No rash noted. No erythema. No pallor.  Psychiatric: She has a normal  mood and affect. Her behavior is normal. Judgment and thought content normal.    ED Course  Procedures (including critical care time)  Labs Reviewed  CBC - Abnormal; Notable for the following:    WBC 10.7 (*)     Hemoglobin 10.9 (*)     HCT 35.5 (*)     MCV 73.0 (*)     MCH 22.4 (*)     RDW 17.9 (*)     Platelets 402 (*)     All other components within normal limits  LAB REPORT - SCANNED   Dg Shoulder Left  05/11/2012  *RADIOLOGY REPORT*  Clinical Data: Seizure.  Fall.  Shoulder pain.  LEFT SHOULDER - 2+ VIEW  Comparison: 01/10/2012  Findings: Patient body habitus and difficulty with positioning apparently causes the various nonstandard views which are provided. These seem to be attempts at transscapular or possibly axillary images, but end up oblique.  My best estimate is that the humeral head is reasonably adjacent to the glenoid, favoring lack of dislocation, but given the highly nonstandard projections the possibility of a posterior dislocation is not easily excluded.  The patient is reportedly pregnant which further complicates imaging.  IMPRESSION:  1.  Nonstandard views reduced diagnostic sensitivity and negative predictive value.  I do not see a definite dislocation, but if the clinical findings are compelling, CT scan may be required because we are unable to obtain standard radiographic images.  Original Report Authenticated By: Dellia Cloud, M.D.     1. Seizure disorder in pregnancy   2. Shoulder pain, left       MDM  Pt has been loaded with keppra IV.  [facility]        Olivia Mackie, MD 05/12/12 1308

## 2012-05-13 ENCOUNTER — Ambulatory Visit (HOSPITAL_COMMUNITY)
Admission: RE | Admit: 2012-05-13 | Discharge: 2012-05-13 | Disposition: A | Discharge status: Home / Self Care | Payer: Medicare Other | Source: Ambulatory Visit | Attending: Family | Admitting: Family

## 2012-05-13 ENCOUNTER — Emergency Department (HOSPITAL_COMMUNITY)
Admission: EM | Admit: 2012-05-13 | Discharge: 2012-05-13 | Disposition: A | Discharge status: Home / Self Care | Payer: Medicare Other | Attending: Emergency Medicine | Admitting: Emergency Medicine

## 2012-05-13 ENCOUNTER — Other Ambulatory Visit: Payer: Self-pay | Admitting: Family

## 2012-05-13 ENCOUNTER — Emergency Department (HOSPITAL_COMMUNITY): Payer: Medicare Other

## 2012-05-13 ENCOUNTER — Encounter (HOSPITAL_COMMUNITY): Payer: Self-pay | Admitting: Emergency Medicine

## 2012-05-13 ENCOUNTER — Inpatient Hospital Stay (HOSPITAL_COMMUNITY)
Admission: AD | Admit: 2012-05-13 | Discharge: 2012-05-13 | Discharge status: Against Medical Advice | Payer: Medicare Other | Source: Ambulatory Visit | Attending: Obstetrics & Gynecology | Admitting: Obstetrics & Gynecology

## 2012-05-13 DIAGNOSIS — Z8249 Family history of ischemic heart disease and other diseases of the circulatory system: Secondary | ICD-10-CM | POA: Insufficient documentation

## 2012-05-13 DIAGNOSIS — Z809 Family history of malignant neoplasm, unspecified: Secondary | ICD-10-CM | POA: Insufficient documentation

## 2012-05-13 DIAGNOSIS — O26899 Other specified pregnancy related conditions, unspecified trimester: Secondary | ICD-10-CM

## 2012-05-13 DIAGNOSIS — R109 Unspecified abdominal pain: Secondary | ICD-10-CM

## 2012-05-13 DIAGNOSIS — M25519 Pain in unspecified shoulder: Secondary | ICD-10-CM | POA: Insufficient documentation

## 2012-05-13 DIAGNOSIS — F319 Bipolar disorder, unspecified: Secondary | ICD-10-CM | POA: Insufficient documentation

## 2012-05-13 DIAGNOSIS — G40909 Epilepsy, unspecified, not intractable, without status epilepticus: Secondary | ICD-10-CM | POA: Insufficient documentation

## 2012-05-13 DIAGNOSIS — Z833 Family history of diabetes mellitus: Secondary | ICD-10-CM | POA: Insufficient documentation

## 2012-05-13 DIAGNOSIS — O3680X0 Pregnancy with inconclusive fetal viability, not applicable or unspecified: Secondary | ICD-10-CM | POA: Insufficient documentation

## 2012-05-13 DIAGNOSIS — R569 Unspecified convulsions: Secondary | ICD-10-CM

## 2012-05-13 MED ORDER — OXYCODONE-ACETAMINOPHEN 5-325 MG PO TABS
1.0000 | ORAL_TABLET | Freq: Once | ORAL | Status: AC
  Administered 2012-05-13: 1 via ORAL
  Filled 2012-05-13 (×2): qty 1

## 2012-05-13 MED ORDER — OXYCODONE-ACETAMINOPHEN 5-325 MG PO TABS: 1.0000 | ORAL_TABLET | Freq: Four times a day (QID) | ORAL | Status: DC | PRN

## 2012-05-13 NOTE — ED Notes (Signed)
Pt for discharge.Vital signs stable and GCS 15.Pt still complains of pain .

## 2012-05-13 NOTE — MAU Note (Signed)
Not in lobby

## 2012-05-13 NOTE — MAU Note (Signed)
Not in lobby #3 

## 2012-05-13 NOTE — ED Notes (Signed)
Pt brought to ED by EMS with the history of seizuers.Pt doesn't remember but thinks that she had a seizers.Pt says she is 6 weeks preg.

## 2012-05-13 NOTE — ED Provider Notes (Signed)
Medical screening examination/treatment/procedure(s) were performed by non-physician practitioner and as supervising physician I was immediately available for consultation/collaboration.  Tameko Halder R. Boneta Standre, MD 05/13/12 0723 

## 2012-05-13 NOTE — MAU Note (Signed)
Not in lobby #2 

## 2012-05-13 NOTE — ED Provider Notes (Signed)
History     CSN: [id number]  Arrival date & time 05/13/12  0308   First MD Initiated Contact with Patient 05/13/12 [phone]      Chief Complaint  Patient presents with  . Seizures    (Consider location/radiation/quality/duration/timing/severity/associated sxs/prior treatment) HPI Comments: 31 y/o morbidly obese female presents with her significant other for ongoing seizures. She was seen at [facility] hospital on  Tuesday night for the same reason. Patient is [redacted] weeks pregnant. She has been seeing [facility] for the past year for her seizures and was on Keppra and Topamax. She was advised to d/c topamax due to her pregnancy 2 weeks ago. Keppra was decreased from 4,000mg  to 2,000 mg at her [facility] visit 2 days ago. Patient states she normally has 1-2 seizures a month, but has had 4 within the past week. Last seizure was Tuesday night. Significant other states the seizures usually last 5 minutes but cannot describe them. Patient herself does not know what her seizures feel like. Patient also states her body hurts, mostly in her left shoulder. This was x-rayed at [facility]. She is having a hard time lifting her arm, and states her pain is worse today than on Tuesday. Denies any chest pain, sob, dizziness, visual changes.  Patient is a 31 y.o. female presenting with seizures. The history is provided by the patient and the spouse.  Seizures  Pertinent negatives include no visual disturbance and no chest pain.    Past Medical History  Diagnosis Date  . Seizures   . Bipolar affective disorder   . Seizure   . Seizures   . Seizures   . Migraines     Past Surgical History  Procedure Date  . Cesarean section     Family History  Problem Relation Age of Onset  . Other Neg Hx   . Cancer Mother   . Diabetes Mother   . Hypertension Father     History  Substance Use Topics  . Smoking status: Never Smoker   . Smokeless tobacco: Not on file  . Alcohol Use: No    OB History    Grav  Para Term Preterm Abortions TAB SAB Ect Mult Living   2 1        1       Review of Systems  Eyes: Negative for visual disturbance.  Respiratory: Negative for shortness of breath.   Cardiovascular: Negative for chest pain.  Musculoskeletal: Positive for arthralgias (left shoulder pain).  Neurological: Positive for seizures. Negative for dizziness.    Allergies  Review of patient's allergies indicates no known allergies.  Home Medications   Current Outpatient Rx  Name Route Sig Dispense Refill  . LEVETIRACETAM 1000 MG PO TABS Oral Take 1 tablet (1,000 mg total) by mouth 2 (two) times daily. 60 tablet 0  . TOPIRAMATE 50 MG PO TABS Oral Take 150 mg by mouth at bedtime.      BP 116/61  Pulse 110  Temp 97.9 F (36.6 C) (Oral)  Resp 22  SpO2 100%  LMP 03/28/2012  Physical Exam  Constitutional: She is oriented to person, place, and time.       Morbidly obese. Patient is in NAD.  HENT:  Head: Normocephalic and atraumatic.  Mouth/Throat: Uvula is midline and oropharynx is clear and moist.  Eyes: Conjunctivae and EOM are normal. Pupils are equal, round, and reactive to light.  Cardiovascular: Normal rate, regular rhythm and normal heart sounds.   Pulmonary/Chest: Effort normal and breath sounds normal.  Abdominal: Soft. Bowel sounds are normal. There is no tenderness.  Musculoskeletal:       Left shoulder: She exhibits decreased range of motion (decreased active and passive rom due to pain), tenderness, bony tenderness and pain. She exhibits no swelling and no deformity.  Neurological: She is alert and oriented to person, place, and time. No sensory deficit.  Skin: Skin is warm.  Psychiatric: She has a normal mood and affect. Her speech is normal and behavior is normal.    ED Course  Procedures (including critical care time)  Labs Reviewed - No data to display Dg Shoulder Left  05/13/2012  *RADIOLOGY REPORT*  Clinical Data: Seizures.  Chronic shoulder pain with limited range  of motion.  LEFT SHOULDER - 2+ VIEW  Comparison: Radiographs 05/11/2012 and 01/10/2012.  Findings: The humeral head is located.  On the Y-view, there is a reverse Hill-Sachs deformity consistent with prior posterior dislocation.  No acute fracture or significant narrowing of the subacromial space is identified.  IMPRESSION: The humeral head is located.  Reverse Hill-Sachs deformity of the humeral head consistent with prior posterior dislocation.  Original Report Authenticated By: Gerrianne Scale, M.D.     1. Seizure   2. Shoulder pain       MDM  Dr. Rubin Payor spoke with neurology who suggested increasing her Keppra back to 4,000mg  per day. Discussed this with patient and she is aware there is a risk in pregnancy with this increase, but also aware seizures put the baby at risk as well. She knows to follow up with neurology as soon as possible. Will also re-image her shoulder due to worsening pain over the past 2 days. 5:52 AM Xray no acute dislocation. Patient aware to increase keppra dose to 4,000mg . rx percocet for shoulder pain. Patient aware to call ASAP for follow up with both neuro and ortho. Patient comfortable with discharge. Case discussed with Dr. Rubin Payor who agrees with plan of care.        Trevor Mace, PA-C 05/13/12 [phone]

## 2012-05-16 ENCOUNTER — Encounter (HOSPITAL_COMMUNITY): Payer: Self-pay | Admitting: Emergency Medicine

## 2012-05-16 ENCOUNTER — Emergency Department (HOSPITAL_COMMUNITY)
Admission: EM | Admit: 2012-05-16 | Discharge: 2012-05-16 | Disposition: A | Discharge status: Home / Self Care | Payer: Medicare Other | Attending: Emergency Medicine | Admitting: Emergency Medicine

## 2012-05-16 ENCOUNTER — Emergency Department (HOSPITAL_COMMUNITY): Payer: Medicare Other

## 2012-05-16 DIAGNOSIS — M25519 Pain in unspecified shoulder: Secondary | ICD-10-CM | POA: Insufficient documentation

## 2012-05-16 DIAGNOSIS — G40909 Epilepsy, unspecified, not intractable, without status epilepticus: Secondary | ICD-10-CM

## 2012-05-16 MED ORDER — ONDANSETRON 4 MG PO TBDP
4.0000 mg | ORAL_TABLET | Freq: Once | ORAL | Status: AC
  Administered 2012-05-16: 4 mg via ORAL
  Filled 2012-05-16: qty 1

## 2012-05-16 MED ORDER — LEVETIRACETAM 1000 MG PO TABS: 2000.0000 mg | ORAL_TABLET | Freq: Two times a day (BID) | ORAL | Status: DC

## 2012-05-16 MED ORDER — MORPHINE SULFATE 4 MG/ML IJ SOLN: 4.0000 mg | Freq: Once | INTRAMUSCULAR | Status: DC

## 2012-05-16 MED ORDER — PRENATAL VITAMINS (DIS) PO TABS: 1.0000 | ORAL_TABLET | Freq: Every day | ORAL | Status: DC

## 2012-05-16 MED ORDER — LEVETIRACETAM 750 MG PO TABS
2000.0000 mg | ORAL_TABLET | Freq: Once | ORAL | Status: AC
  Administered 2012-05-16: 2000 mg via ORAL
  Filled 2012-05-16 (×2): qty 1

## 2012-05-16 MED ORDER — MORPHINE SULFATE 4 MG/ML IJ SOLN
4.0000 mg | Freq: Once | INTRAMUSCULAR | Status: AC
  Administered 2012-05-16: 4 mg via INTRAMUSCULAR
  Filled 2012-05-16: qty 1

## 2012-05-16 MED ORDER — HYDROCODONE-ACETAMINOPHEN 5-325 MG PO TABS
2.0000 | ORAL_TABLET | Freq: Once | ORAL | Status: AC
  Administered 2012-05-16: 2 via ORAL
  Filled 2012-05-16: qty 2

## 2012-05-16 NOTE — ED Provider Notes (Signed)
I saw and evaluated the patient, reviewed the resident's note and I agree with the findings and plan.   .Face to face Exam:  General:  Awake HEENT:  Atraumatic Resp:  Normal effort Abd:  Nondistended Neuro:No focal weakness Lymph: No adenopathy   Nelia Shi, MD 05/16/12 1300

## 2012-05-16 NOTE — Progress Notes (Signed)
Correction in previous note: Dr Ree Edman called with attempt to get pt an appointment and told that pt had been dismissed from practice.  Pt reports she did call and speak with a nurse about having seizures and Pt voice concern about not being able to return to the office Pt states she was referred to [facility] by an EDP but had not made an appointment.  CM called [facility] and informed that Dr  Denton Meek will be leaving the practice soon and his replacement has not be designated.

## 2012-05-16 NOTE — Progress Notes (Signed)
Reviewed with pt concerns voiced by Dr Clinton Sawyer about missed appointments at [facility] neurological Dr Ree Edman called with attempt to get pt an appointment and told Pt reports she did call and speak with a nurse about having seizures and Pt vo ED Cm called [facility] office and requested to speak with supervisor after reviewing with receptionist who confirms pt had 5 appointments she attended one and the office notes shows she did not call to cancel 4 appointments Pt has been "dismissed from the practice"  Supervisor states Dr Clinton Sawyer has already left a voice message for Dr Marjory Lies and it is pending Dr Marjory Lies response whether or not pt can be re instated as a patient.  Offered possible other neurological providers [facility] Pt states she is not having issues obtaining and taking medications for seizures. She states she is now "confused" on the dosage to take Reports during frequent visits to ED she her medication dosage has been changed often.   Boyfriend states pt takes "four tablets" but not sure what dosage Explained to pt and boyfriend that Keppra does come in varying doses 500 mg, 1000 mg etc Encouraged to follow label on pill bottle for correct dosage.  ED RN updated Dr Clinton Sawyer confirmed with pt that she does have a follow up appointment with a OB GYN at [facility] on 05/23/12 at 1430. Dr Clinton Sawyer encouraged prenatal vitamins

## 2012-05-16 NOTE — Progress Notes (Signed)
[facility] ED CM spoke with Dr Clinton Sawyer, Resident working with Dr Radford Pax about pt not taking medications appropriately and frequent visits to ED.  Pt [redacted] weeks pregnant with PMH bipolar and seizures.  CM spoke with [place]ka, ACT team member to inquire of a community program to assist bipolar pt with being compliant with medications at home [place] offered information on psychotherapeutic services in [place].  CM spoke with pt and boyfriend in room #18 to review Cm consult for assistance with compliance with medications and follow up appointment to neurology.  Explained importance of community level maintenance care needs for compliance with medications and neurological appointments to prevent frequent seizures while she is pregnant.  Information offered by the ACT team member will not be used because pt states she is not taking bipolar medications. States she has not taken bipolar medication "for months. Has not been to the ED needing treatment for bipolar per pt.

## 2012-05-16 NOTE — ED Provider Notes (Signed)
History     CSN: [id number]  Arrival date & time 05/16/12  8657   First MD Initiated Contact with Patient 05/16/12 0719      Chief Complaint  Patient presents with  . Seizures    (Consider location/radiation/quality/duration/timing/severity/associated sxs/prior treatment) HPI  [name] [name] is a 31 year old pregnant female with a known seizure disorder who presents today after a witnessed seizure by her boyfriend. He notes that the seizure was characterized by generalized shaking, occurred while she was sleeping, and was a few minutes duration. She denies incontinence and states that she was confused afterwards, but is feeling better now. The patient has presented to the ED for this problem 3 times recently. On 7/12, after a seizure event, she was told told to discontinue her topamax and keppra secondary being discovered to be pregnant. She was also instructed to follow up on with a neurologist and OB-GYN. On 7/14, she presented to the MAU at [facility] and was instructed to restart Keppra 1000 mg BID. On 7/23, she presented to the MAU again with a possible seizure and was transferred to [facility] for further evaluation. She was discharged with instructions for following up with a neurologist. However, she returned on 7/26 after another seizure disorder. Dr. Rubin Payor, the EDP, spoke with a  Neurologist who recommended increasing her dose of Keppra to 2000 mg BID. The patient and her boyfriend deny knowing that she was supposed to be taking 4000 mg of Keppra daily. The boyfriend also notes that she does not take her medications consistently at the same time each day. Also, she has not been able to schedule an appointment with with [facility], because she was previously dismissed for failure-to-show for appointments. She was previously a patient of Dr. Marjory Lies.   Past Medical History  Diagnosis Date  . Bipolar affective disorder   . Migraines   . Seizures   . Seizure     Past  Surgical History  Procedure Date  . Cesarean section     Family History  Problem Relation Age of Onset  . Other Neg Hx   . Cancer Mother   . Diabetes Mother   . Hypertension Father     History  Substance Use Topics  . Smoking status: Never Smoker   . Smokeless tobacco: Not on file  . Alcohol Use: No    OB History    Grav Para Term Preterm Abortions TAB SAB Ect Mult Living   2 1        1       Review of Systems  HENT: Negative.   Eyes: Negative.   Respiratory: Positive for chest tightness.   Cardiovascular: Positive for chest pain.  Genitourinary: Negative.   Musculoskeletal:       Left shoulder pain  Neurological: Positive for weakness.  Hematological: Negative.   Psychiatric/Behavioral: Negative for agitation.    Allergies  Review of patient's allergies indicates no known allergies.  Home Medications   Current Outpatient Rx  Name Route Sig Dispense Refill  . LEVETIRACETAM 1000 MG PO TABS Oral Take 1 tablet (1,000 mg total) by mouth 2 (two) times daily. 60 tablet 0  . OXYCODONE-ACETAMINOPHEN 5-325 MG PO TABS Oral Take 1-2 tablets by mouth every 6 (six) hours as needed for pain. 15 tablet 0    BP 130/84  Pulse 89  Temp 98.3 F (36.8 C) (Oral)  Resp 16  SpO2 99%  LMP 03/28/2012  Physical Exam  Constitutional: Vital signs are normal. She appears  well-developed and well-nourished. She does not appear ill.       Morbidly obese  HENT:  Head: Normocephalic and atraumatic.  Mouth/Throat: No oropharyngeal exudate.  Eyes: Conjunctivae are normal. Pupils are equal, round, and reactive to light.  Neck: Normal range of motion.  Cardiovascular: Normal rate and regular rhythm.   Pulmonary/Chest: Effort normal and breath sounds normal.  Abdominal: She exhibits no distension. There is no tenderness. There is no rebound.       Morbidly obese  Musculoskeletal:       Left shoulder: She exhibits decreased range of motion, tenderness and bony tenderness. She exhibits  no swelling, no effusion, no crepitus and no deformity.       Physical exam limited by body habitus  Neurological: She is alert.  Skin: No abrasion and no rash noted.       Acanthosis nigricans circumferentially around her neck  Psychiatric: Her speech is normal and behavior is normal. Thought content normal. Her affect is blunt.    ED Course  Procedures (including critical care time)  Labs Reviewed - No data to display Dg Shoulder Left  05/16/2012  *RADIOLOGY REPORT*  Clinical Data: Severe left shoulder pain, seizure, [redacted] weeks pregnant  LEFT SHOULDER - 2+ VIEW  Comparison: 05/13/2012  Findings: No fracture or dislocation is seen.  Deformity of the anterolateral humeral head, likely reflecting prior impaction related to dislocation (reverse Hill-Sachs deformity).  The joint spaces are preserved.  Visualized left lung is essentially clear.  IMPRESSION: No fracture or dislocation is seen.  Original Report Authenticated By: Charline Bills, M.D.     1. Seizure disorder in pregnancy      Date: 05/16/2012  Rate: 80  Rhythm: normal sinus rhythm  QRS Axis: normal  Intervals: normal  ST/T Wave abnormalities: normal  Conduction Disutrbances:none  Narrative Interpretation: normal ECG  Old EKG Reviewed: unchanged    MDM  [name] presented after a witnessed seizure, but had no deficits are evident post ictal phase upon presentation. This is a recurrent problem for the patient due to recent changes in her medication after finding out that she was pregnant on July 12. After she was discharged on July 26, the patient did not take 4000 mg of Keppra as instructed. Therefore, a subtherapeutic dose of her antiepileptic medications likely to cause of her seizure. Given that she did not actively seize in the ED, there was no need for admission and further workup. However, the patient's case was palpated by social factors. Primarily, the patient does not have any current physicians in the community.  She was not able to be seen by her neurologist Dr. Marjory Lies because she is discharged from the practice. I called [facility] multiple times during her ED admission attempting schedule appointment for her. They would not do so, because they wanted to wait for Dr. Marjory Lies to review her case to see if he would evaluate her again. I also consulted case management to see if they can write a resource for patient. Our case manager Cala Bradford did a wonderful  job in helping her establish care with a neurologist at [facility]. She also encouraged Mrs. Dubose to take her Keppra as prescribed, to establish care with the PCP, and followup with her OB/GYN doctor at [facility]. The patient is understanding of the plan and in agreement. Additionally, the patient's will followup with her orthopedic complicating this pregnancy.        Garnetta Buddy, MD 05/16/12 1259

## 2012-05-16 NOTE — ED Notes (Signed)
Pt states she recently was told she is pregnant, advised to stop taking Topamax for seizures by [facility] d/t pregnancy. Pt continues taking Keppra. Pt states seizures have increased recently. Pt does have tongue trauma, pt has not been seen by neuro recently, states she needs to be established at new practice. Pt c/o pain to L shoulder, pt has had shoulder assessed multiple times including xrays, pt states she has not followed up with ortho.

## 2012-05-16 NOTE — ED Notes (Signed)
Per EMS pt transported from home after having witnessed 5 min seizure, per EMS pt is 6 weeks preg and has had medication dosages lowered and has experienced increased frequency of seizure. Per EMS tongue trauma, neg for incontinence.

## 2012-05-16 NOTE — ED Notes (Signed)
Bed:WA18<BR> Expected date:<BR> Expected time:<BR> Means of arrival:<BR> Comments:<BR>

## 2012-05-16 NOTE — ED Notes (Signed)
MD at bedside. Waiting for neurologist to call back prior to discharge. Pt states pain not improved to left shoulder.

## 2012-05-16 NOTE — ED Notes (Signed)
Patient transported to X-ray 

## 2012-05-24 ENCOUNTER — Ambulatory Visit (INDEPENDENT_AMBULATORY_CARE_PROVIDER_SITE_OTHER): Payer: Medicare Other | Admitting: Obstetrics and Gynecology

## 2012-05-24 DIAGNOSIS — G40909 Epilepsy, unspecified, not intractable, without status epilepticus: Secondary | ICD-10-CM

## 2012-05-24 DIAGNOSIS — Z331 Pregnant state, incidental: Secondary | ICD-10-CM

## 2012-05-24 DIAGNOSIS — Z3201 Encounter for pregnancy test, result positive: Secondary | ICD-10-CM

## 2012-05-25 ENCOUNTER — Telehealth: Payer: Self-pay | Admitting: Obstetrics and Gynecology

## 2012-05-25 LAB — POCT URINALYSIS DIPSTICK
Bilirubin, UA: NEGATIVE
Blood, UA: NEGATIVE
Ketones, UA: NEGATIVE
Leukocytes, UA: NEGATIVE
pH, UA: 5

## 2012-05-25 LAB — PRENATAL PANEL VII
Basophils Relative: 0 % (ref 0–1)
Eosinophils Absolute: 0.1 10*3/uL (ref 0.0–0.7)
Hepatitis B Surface Ag: NEGATIVE
MCH: 22.4 pg — ABNORMAL LOW (ref 26.0–34.0)
MCHC: 31.8 g/dL (ref 30.0–36.0)
Neutrophils Relative %: 60 % (ref 43–77)
Platelets: 465 10*3/uL — ABNORMAL HIGH (ref 150–400)
RBC: 4.82 MIL/uL (ref 3.87–5.11)

## 2012-05-25 NOTE — Telephone Encounter (Signed)
Tc to pt, informed appt made @ [facility] on Thursday 06/09/12 @1400 , pt voices understanding.

## 2012-05-25 NOTE — Progress Notes (Signed)
NOB interview completed.  Pt has hx of seizure disorder, is [redacted] wks pregnant.  Per ND, refer pt to [facility] for consult.  Pt informed will make referral and will call back with appt date and time.  Pt NOB work up scheduled on Monday 06/06/2012 @ 1530 w/ MK.  Bleeding precautions given, questions answered.

## 2012-05-26 LAB — HEMOGLOBINOPATHY EVALUATION
Hemoglobin Other: 0 %
Hgb A2 Quant: 2.6 % (ref 2.2–3.2)
Hgb A: 97.4 % (ref 96.8–97.8)
Hgb F Quant: 0 % (ref 0.0–2.0)
Hgb S Quant: 0 %

## 2012-05-27 ENCOUNTER — Encounter: Payer: Self-pay | Admitting: Obstetrics and Gynecology

## 2012-05-27 DIAGNOSIS — O2340 Unspecified infection of urinary tract in pregnancy, unspecified trimester: Secondary | ICD-10-CM | POA: Insufficient documentation

## 2012-05-27 DIAGNOSIS — Z98891 History of uterine scar from previous surgery: Secondary | ICD-10-CM | POA: Insufficient documentation

## 2012-05-27 DIAGNOSIS — G40909 Epilepsy, unspecified, not intractable, without status epilepticus: Secondary | ICD-10-CM | POA: Insufficient documentation

## 2012-05-27 DIAGNOSIS — F319 Bipolar disorder, unspecified: Secondary | ICD-10-CM | POA: Insufficient documentation

## 2012-05-27 DIAGNOSIS — Z6841 Body Mass Index (BMI) 40.0 and over, adult: Secondary | ICD-10-CM

## 2012-05-27 DIAGNOSIS — G43909 Migraine, unspecified, not intractable, without status migrainosus: Secondary | ICD-10-CM | POA: Insufficient documentation

## 2012-05-31 ENCOUNTER — Encounter (HOSPITAL_COMMUNITY): Payer: Self-pay | Admitting: Family Medicine

## 2012-05-31 ENCOUNTER — Telehealth: Payer: Self-pay

## 2012-05-31 ENCOUNTER — Emergency Department (HOSPITAL_COMMUNITY): Payer: Medicare Other

## 2012-05-31 ENCOUNTER — Emergency Department (HOSPITAL_COMMUNITY)
Admission: EM | Admit: 2012-05-31 | Discharge: 2012-05-31 | Disposition: A | Discharge status: Home / Self Care | Payer: Medicare Other | Attending: Emergency Medicine | Admitting: Emergency Medicine

## 2012-05-31 DIAGNOSIS — W503XXA Accidental bite by another person, initial encounter: Secondary | ICD-10-CM | POA: Insufficient documentation

## 2012-05-31 DIAGNOSIS — G40802 Other epilepsy, not intractable, without status epilepticus: Secondary | ICD-10-CM | POA: Insufficient documentation

## 2012-05-31 DIAGNOSIS — O2340 Unspecified infection of urinary tract in pregnancy, unspecified trimester: Secondary | ICD-10-CM

## 2012-05-31 DIAGNOSIS — IMO0002 Reserved for concepts with insufficient information to code with codable children: Secondary | ICD-10-CM | POA: Insufficient documentation

## 2012-05-31 DIAGNOSIS — M25519 Pain in unspecified shoulder: Secondary | ICD-10-CM | POA: Insufficient documentation

## 2012-05-31 DIAGNOSIS — Z79899 Other long term (current) drug therapy: Secondary | ICD-10-CM | POA: Insufficient documentation

## 2012-05-31 DIAGNOSIS — R569 Unspecified convulsions: Secondary | ICD-10-CM

## 2012-05-31 DIAGNOSIS — O99891 Other specified diseases and conditions complicating pregnancy: Secondary | ICD-10-CM | POA: Insufficient documentation

## 2012-05-31 LAB — POCT I-STAT, CHEM 8
Chloride: 107 mEq/L (ref 96–112)
Glucose, Bld: 139 mg/dL — ABNORMAL HIGH (ref 70–99)
HCT: 36 % (ref 36.0–46.0)
Potassium: 3.8 mEq/L (ref 3.5–5.1)

## 2012-05-31 MED ORDER — LEVETIRACETAM 500 MG PO TABS
1500.0000 mg | ORAL_TABLET | Freq: Once | ORAL | Status: AC
  Administered 2012-05-31: 1500 mg via ORAL
  Filled 2012-05-31: qty 2

## 2012-05-31 MED ORDER — ACETAMINOPHEN 325 MG PO TABS
650.0000 mg | ORAL_TABLET | Freq: Once | ORAL | Status: AC
  Administered 2012-05-31: 650 mg via ORAL
  Filled 2012-05-31: qty 2

## 2012-05-31 MED ORDER — LEVETIRACETAM 750 MG PO TABS
1500.0000 mg | ORAL_TABLET | Freq: Once | ORAL | Status: AC
  Administered 2012-05-31: 1500 mg via ORAL
  Filled 2012-05-31: qty 2

## 2012-05-31 MED ORDER — OXYCODONE-ACETAMINOPHEN 5-325 MG PO TABS
2.0000 | ORAL_TABLET | Freq: Once | ORAL | Status: AC
  Administered 2012-05-31: 2 via ORAL
  Filled 2012-05-31: qty 2

## 2012-05-31 MED ORDER — SODIUM CHLORIDE 0.9 % IV SOLN
1500.0000 mg | Freq: Once | INTRAVENOUS | Status: DC
  Filled 2012-05-31: qty 15

## 2012-05-31 NOTE — ED Notes (Addendum)
Patient refuses to lay in the bed, patient will only sit on the side of the bed and will not undress. Nurse notified

## 2012-05-31 NOTE — Telephone Encounter (Signed)
Attempted to reach pt regarding results

## 2012-05-31 NOTE — Telephone Encounter (Signed)
Message copied by Janeece Agee on Tue May 31, 2012  5:21 PM ------      Message from: Cornelius Moras      Created: Fri May 27, 2012  8:14 AM      Regarding: Patient Rx       UTI by culture.  Need Ceftin 500 mg po BID x 7 days, with TOC after completion of treatment.            Thanks!      VL      ----- Message -----         From: [facility]         Sent: 05/25/2012   4:37 AM           To: Nigel Bridgeman, CNM

## 2012-05-31 NOTE — ED Notes (Signed)
ZOX:[id number]<AV> Expected date:05/31/12<BR> Expected time: 4:48 PM<BR> Means of arrival:Ambulance<BR> Comments:<BR> seizure

## 2012-05-31 NOTE — ED Provider Notes (Signed)
History     CSN: [id number]  Arrival date & time 05/31/12  1704   First MD Initiated Contact with Patient 05/31/12 1737      Chief Complaint  Patient presents with  . Seizures  . [redacted] weeks pregnant     (Consider location/radiation/quality/duration/timing/severity/associated sxs/prior treatment) HPI  Patient with report of grand mal seizures. Her boyfriend states it lasted approximately 4 minutes. She is reported been post ictal upon arrival of EMS. She has an abrasion to her tongue. She is reportedly pregnant. She had not taken her Keppra yesterday or today because she states that she was told by her neurologist that she thought taking the generic brand and that they had prescribed her Keppra instead of levetiracetam generic.  She states that her Medicaid had initially refused to pay for the brand name. Her boyfriend states that he has since called her doctor and they have arranged for the Route to be ready at the drugstore. Patient complains of left shoulder pain. Her last seizure was last month. She has had seizures for several years. She did not fall to the ground and did not strike her head. She was in the car and had her seatbelt on.  Past Medical History  Diagnosis Date  . Migraines     would be rx'd Percacet for relief  . Seizures 05/2011    unsure what started seizures;Neurology appt on 05/30/12;on Kepra 2000mg  bid;was on Topramax 50mg  tid  . Bipolar affective disorder     no meds currently;has been years since taking meds  . Infection     Yeast inf;not frequent  . Anemia     has taken iron supplements in the past  . Yeast infection   . H/O varicella   . Wears glasses     Past Surgical History  Procedure Date  . Cesarean section   . Wisdom tooth extraction     All 4 removed  . Tonsillectomy     During high school years    Family History  Problem Relation Age of Onset  . Other Neg Hx   . Cancer Mother     Ovarian  . Diabetes Mother   . Hypertension Father   .  Cirrhosis Father   . Hypertension Brother   . Asthma Son     History  Substance Use Topics  . Smoking status: Never Smoker   . Smokeless tobacco: Never Used  . Alcohol Use: No    OB History    Grav Para Term Preterm Abortions TAB SAB Ect Mult Living   2 1 1       1       Review of Systems  All other systems reviewed and are negative.    Allergies  Review of patient's allergies indicates no known allergies.  Home Medications   Current Outpatient Rx  Name Route Sig Dispense Refill  . LEVETIRACETAM 1000 MG PO TABS Oral Take 2,000 mg by mouth daily.     Marland Kitchen PRENATAL VITAMINS (DIS) PO TABS Oral Take 1 tablet by mouth daily.      BP 130/76  Pulse 88  Temp 98.1 F (36.7 C) (Oral)  Resp 16  SpO2 99%  LMP 03/28/2012  Physical Exam  Nursing note and vitals reviewed. Constitutional: She appears well-developed and well-nourished.       Morbidly obese  HENT:  Head: Normocephalic and atraumatic.       Abrasion to left side of tongue  Eyes: Conjunctivae and EOM are normal. Pupils are  equal, round, and reactive to light.  Neck: Normal range of motion. Neck supple.  Cardiovascular: Normal rate, regular rhythm, normal heart sounds and intact distal pulses.   Pulmonary/Chest: Effort normal and breath sounds normal.  Abdominal: Soft. Bowel sounds are normal.  Musculoskeletal: Normal range of motion.       Some tenderness over left shoulder. Full active range of motion motion. Radial pulses are 2+  Neurological: She is alert.  Skin: Skin is warm and dry.  Psychiatric: She has a normal mood and affect. Thought content normal.    ED Course  Procedures (including critical care time)  Labs Reviewed  PREGNANCY, URINE - Abnormal; Notable for the following:    Preg Test, Ur POSITIVE (*)     All other components within normal limits  POCT I-STAT, CHEM 8 - Abnormal; Notable for the following:    Glucose, Bld 139 (*)     All other components within normal limits   No results  found.   No diagnosis found.    MDM  Patient refuses x-rays of the left shoulder here. She asked that we repeat pregnancy test. Her pregnancy test test is positive. She is following up with OB. She was given Keppra 1500 mg here and is given an additional 1500 mg prior to discharge. She'll pick up her prescription and will be taking her Keppra as prescribed and follow up with her neurologist. Her neurologist is aware of her pregnancy and is continuing to Keppra.      Hilario Quarry, MD 06/02/12 [phone]

## 2012-05-31 NOTE — ED Notes (Signed)
Per EMS: Pt had a witnessed seizure lasting approx 4 minutes per pt's boyfriend. Tongue injury noted.  Pt post-ictal upon arrival of ems. VSS. CBG: 96. NAD.

## 2012-06-01 MED ORDER — CEFUROXIME AXETIL 500 MG PO TABS: 500.0000 mg | ORAL_TABLET | Freq: Two times a day (BID) | ORAL | Status: DC

## 2012-06-01 NOTE — Telephone Encounter (Signed)
TYVONNA/RETURN CALL °

## 2012-06-01 NOTE — Telephone Encounter (Signed)
Spoke with pt regarding abnormal lab result shows UTI. Per VL pt is to take Ceftin 500 mg bid x 7 days will send to [facility] [address]. Pt also informed once med is complete TOC is needed. Pt agrees and voices understanding.

## 2012-06-03 ENCOUNTER — Emergency Department (HOSPITAL_COMMUNITY)
Admission: EM | Admit: 2012-06-03 | Discharge: 2012-06-04 | Disposition: A | Discharge status: Home / Self Care | Payer: Medicare Other | Attending: Emergency Medicine | Admitting: Emergency Medicine

## 2012-06-03 ENCOUNTER — Encounter: Payer: Self-pay | Admitting: Obstetrics and Gynecology

## 2012-06-03 ENCOUNTER — Encounter (HOSPITAL_COMMUNITY): Payer: Self-pay | Admitting: Emergency Medicine

## 2012-06-03 ENCOUNTER — Encounter (HOSPITAL_COMMUNITY): Payer: Self-pay | Admitting: Obstetrics and Gynecology

## 2012-06-03 DIAGNOSIS — Z363 Encounter for antenatal screening for malformations: Secondary | ICD-10-CM | POA: Insufficient documentation

## 2012-06-03 DIAGNOSIS — Z3490 Encounter for supervision of normal pregnancy, unspecified, unspecified trimester: Secondary | ICD-10-CM

## 2012-06-03 DIAGNOSIS — IMO0001 Reserved for inherently not codable concepts without codable children: Secondary | ICD-10-CM | POA: Insufficient documentation

## 2012-06-03 DIAGNOSIS — M791 Myalgia, unspecified site: Secondary | ICD-10-CM

## 2012-06-03 DIAGNOSIS — Z1389 Encounter for screening for other disorder: Secondary | ICD-10-CM | POA: Insufficient documentation

## 2012-06-03 DIAGNOSIS — O99350 Diseases of the nervous system complicating pregnancy, unspecified trimester: Secondary | ICD-10-CM | POA: Insufficient documentation

## 2012-06-03 DIAGNOSIS — G40909 Epilepsy, unspecified, not intractable, without status epilepticus: Secondary | ICD-10-CM | POA: Insufficient documentation

## 2012-06-03 NOTE — ED Notes (Signed)
NWG:[id number]<ZH> Expected date:<BR> Expected time:<BR> Means of arrival:<BR> Comments:<BR> Medic 10, [redacted]week pregnant s/p seizure, ambulatory on scene

## 2012-06-03 NOTE — ED Notes (Signed)
Report given via EMS. Pt reports waking up at 2230 and believes she had a seizure. No postictal phase. AAOx4. Ambulatory on scene. Pt refused IV from EMS states they "suck". Pt reports having seizure 3-4x a week. Initial VS 122/56 HR 98 NSR 100% O2 RA CBG 184 at 2253.

## 2012-06-04 ENCOUNTER — Emergency Department (HOSPITAL_COMMUNITY): Payer: Medicare Other

## 2012-06-04 LAB — CBC WITH DIFFERENTIAL/PLATELET
Eosinophils Absolute: 0.1 10*3/uL (ref 0.0–0.7)
Eosinophils Relative: 1 % (ref 0–5)
HCT: 34 % — ABNORMAL LOW (ref 36.0–46.0)
Hemoglobin: 10.5 g/dL — ABNORMAL LOW (ref 12.0–15.0)
Lymphs Abs: 2.5 10*3/uL (ref 0.7–4.0)
MCH: 22.6 pg — ABNORMAL LOW (ref 26.0–34.0)
MCV: 73.1 fL — ABNORMAL LOW (ref 78.0–100.0)
Monocytes Relative: 5 % (ref 3–12)
RBC: 4.65 MIL/uL (ref 3.87–5.11)

## 2012-06-04 LAB — COMPREHENSIVE METABOLIC PANEL
Alkaline Phosphatase: 45 U/L (ref 39–117)
BUN: 7 mg/dL (ref 6–23)
CO2: 24 mEq/L (ref 19–32)
Calcium: 9.2 mg/dL (ref 8.4–10.5)
GFR calc Af Amer: >90 mL/min (ref >90)
GFR calc non Af Amer: >90 mL/min (ref >90)
Glucose, Bld: 105 mg/dL — ABNORMAL HIGH (ref 70–99)
Potassium: 4.1 mEq/L (ref 3.5–5.1)
Total Protein: 6.6 g/dL (ref 6.0–8.3)

## 2012-06-04 LAB — ABO/RH: ABO/RH(D): O POS

## 2012-06-04 LAB — POCT PREGNANCY, URINE: Preg Test, Ur: POSITIVE — AB

## 2012-06-04 MED ORDER — ACETAMINOPHEN 325 MG PO TABS
650.0000 mg | ORAL_TABLET | Freq: Once | ORAL | Status: AC
  Administered 2012-06-04: 650 mg via ORAL
  Filled 2012-06-04: qty 2

## 2012-06-04 NOTE — ED Provider Notes (Signed)
History     CSN: [id number]  Arrival date & time 06/03/12  2259   First MD Initiated Contact with Patient 06/04/12 0236      Chief Complaint  Patient presents with  . Seizures   HPI  History provided by the patient. Patient is a 31 year old female with history of seizure disorder term pregnancy approximately [redacted] weeks along presents with seizure. Patient states that she believes she had a seizure while she was asleep because she feels sore all over which typical after a seizure. Patient is currently being treated with Keppra 3000 mg a day. Patient admits to taking this regularly as prescribed for the past several days. Patient states she has been in and out of the emergency room frequently due to seizure activity. Patient was also switch to a new neurologist at [facility] but is not recall the doctor's name. Patient is being followed for her pregnancy with OB/GYN specialist. She denies having any ultrasound workup to this point. Patient denies having any abdominal pain, vaginal bleeding or vaginal discharge. Patient denies any fever, chills, sweats. She denies any urinary symptoms. No dysuria, hematuria, urinary frequency.    Past Medical History  Diagnosis Date  . Migraines     would be rx'd Percacet for relief  . Seizures 05/2011    unsure what started seizures;Neurology appt on 05/30/12;on Kepra 2000mg  bid;was on Topramax 50mg  tid  . Bipolar affective disorder     no meds currently;has been years since taking meds  . Infection     Yeast inf;not frequent  . Anemia     has taken iron supplements in the past  . Yeast infection   . H/O varicella   . Wears glasses     Past Surgical History  Procedure Date  . Cesarean section   . Wisdom tooth extraction     All 4 removed  . Tonsillectomy     During high school years    Family History  Problem Relation Age of Onset  . Other Neg Hx   . Cancer Mother     Ovarian  . Diabetes Mother   . Hypertension Father   .  Cirrhosis Father   . Hypertension Brother   . Asthma Son     History  Substance Use Topics  . Smoking status: Never Smoker   . Smokeless tobacco: Never Used  . Alcohol Use: No    OB History    Grav Para Term Preterm Abortions TAB SAB Ect Mult Living   2 1 1       1       Review of Systems  Constitutional: Negative for fever, chills and unexpected weight change.  HENT: Negative for neck pain.   Respiratory: Negative for shortness of breath.   Cardiovascular: Negative for chest pain.  Gastrointestinal: Negative for nausea, vomiting and abdominal pain.  Genitourinary: Negative for dysuria, frequency, hematuria, flank pain, vaginal bleeding and vaginal discharge.  Musculoskeletal: Positive for myalgias. Negative for back pain.  Skin: Negative for rash.  Neurological: Positive for seizures and headaches. Negative for weakness and numbness.    Allergies  Review of patient's allergies indicates no known allergies.  Home Medications   Current Outpatient Rx  Name Route Sig Dispense Refill  . CEFUROXIME AXETIL 500 MG PO TABS Oral Take 500 mg by mouth 2 (two) times daily.    Marland Kitchen LEVETIRACETAM ER 750 MG PO TB24 Oral Take 3,000 mg by mouth daily.    Marland Kitchen PRENATAL VITAMINS (DIS) PO TABS Oral  Take 1 tablet by mouth daily.      BP 137/99  Pulse 105  Temp 97.7 F (36.5 C) (Oral)  Resp 16  SpO2 100%  LMP 03/28/2012  Physical Exam  Nursing note and vitals reviewed. Constitutional: She is oriented to person, place, and time. She appears well-developed and well-nourished. No distress.  HENT:  Head: Normocephalic and atraumatic.       No bite marks the tongue  Eyes: Conjunctivae and EOM are normal. Pupils are equal, round, and reactive to light.  Cardiovascular: Normal rate and regular rhythm.   Pulmonary/Chest: Effort normal and breath sounds normal.  Abdominal: Soft. There is no tenderness. There is no rebound and no guarding.       Morbidly obese  Neurological: She is alert and  oriented to person, place, and time. She has normal strength. No sensory deficit.  Skin: Skin is warm and dry.  Psychiatric: She has a normal mood and affect. Her behavior is normal.    ED Course  Procedures   Results for orders placed during the hospital encounter of 06/03/12  GLUCOSE, CAPILLARY      Component Value Range   Glucose-Capillary 118 (*) 70 - 99 mg/dL  CBC WITH DIFFERENTIAL      Component Value Range   WBC 8.9  4.0 - 10.5 K/uL   RBC 4.65  3.87 - 5.11 MIL/uL   Hemoglobin 10.5 (*) 12.0 - 15.0 g/dL   HCT 16.1 (*) 09.6 - 04.5 %   MCV 73.1 (*) 78.0 - 100.0 fL   MCH 22.6 (*) 26.0 - 34.0 pg   MCHC 30.9  30.0 - 36.0 g/dL   RDW 40.9 (*) 81.1 - 91.4 %   Platelets 431 (*) 150 - 400 K/uL   Neutrophils Relative 66  43 - 77 %   Neutro Abs 5.9  1.7 - 7.7 K/uL   Lymphocytes Relative 28  12 - 46 %   Lymphs Abs 2.5  0.7 - 4.0 K/uL   Monocytes Relative 5  3 - 12 %   Monocytes Absolute 0.5  0.1 - 1.0 K/uL   Eosinophils Relative 1  0 - 5 %   Eosinophils Absolute 0.1  0.0 - 0.7 K/uL   Basophils Relative 0  0 - 1 %   Basophils Absolute 0.0  0.0 - 0.1 K/uL  COMPREHENSIVE METABOLIC PANEL      Component Value Range   Sodium 136  135 - 145 mEq/L   Potassium 4.1  3.5 - 5.1 mEq/L   Chloride 102  96 - 112 mEq/L   CO2 24  19 - 32 mEq/L   Glucose, Bld 105 (*) 70 - 99 mg/dL   BUN 7  6 - 23 mg/dL   Creatinine, Ser 7.82  0.50 - 1.10 mg/dL   Calcium 9.2  8.4 - 95.6 mg/dL   Total Protein 6.6  6.0 - 8.3 g/dL   Albumin 3.0 (*) 3.5 - 5.2 g/dL   AST 13  0 - 37 U/L   ALT 9  0 - 35 U/L   Alkaline Phosphatase 45  39 - 117 U/L   Total Bilirubin 0.2 (*) 0.3 - 1.2 mg/dL   GFR calc non Af Amer >90  >90 mL/min   GFR calc Af Amer >90  >90 mL/min  POCT PREGNANCY, URINE      Component Value Range   Preg Test, Ur POSITIVE (*) NEGATIVE  CK      Component Value Range   Total CK 66  7 - 177 U/L       1. Myalgia   2. Seizure disorder       MDM  2:45 AM patient seen and evaluated. Patient  appears well resting complaint bed. No acute distress. Patient reports taking medications as prescribed. She reports taking 4 pills of Keppra as prescribed yesterday. Patient states she believes she had a seizure in her sleep because she awoke feeling sore.   No Doppler heart tones. Patient has not been worked up for pregnancy. She believes she may be [redacted] weeks pregnant. We'll perform hCG, and further workup.   Pt discussed with Attending Physician.  Will also consider [place] to work up pregnancy.  Patient discussed in sign out with Trixie Dredge PA-C. She will follow lab testing and also results.     Angus Seller, [place] 06/04/12 [phone]

## 2012-06-04 NOTE — ED Provider Notes (Signed)
6:34 AM Patient seen and examined.  Assumed care at change of shift from [facility], PA-C.  Patient with aching all over and headache that she thinks may have been caused by a seizure in her sleep.  Pt is on Keppra and reports she is taking it appropriately.  No associated ill-symptoms.  Workup is unremarkable.   Pt is A&O, NAD, RRR, CTAB, abd morbidly obese, NABS, soft, NT, no guarding, no rebound.  Plan is for quantitative HCG and pelvic US as pt has pain all over including lower abdomen.  Per Theron Arista, pt has denied vaginal bleeding or discharge. If US/quant consistent and showing IUP, pt to be d/c home with neurology and obgyn follow up.    9:02 AM Patient resting, appears comfortable.  Discussed [place] results with her.  Pt d/c home with tylenol for pain, neuro and obgyn follow up.  Discussed safety of medication in pregnancy.  Pt given return precautions.  Pt verbalizes understanding and agrees with plan.     Results for orders placed during the hospital encounter of 06/03/12  GLUCOSE, CAPILLARY      Component Value Range   Glucose-Capillary 118 (*) 70 - 99 mg/dL  CBC WITH DIFFERENTIAL      Component Value Range   WBC 8.9  4.0 - 10.5 K/uL   RBC 4.65  3.87 - 5.11 MIL/uL   Hemoglobin 10.5 (*) 12.0 - 15.0 g/dL   HCT 13.2 (*) 44.0 - 10.2 %   MCV 73.1 (*) 78.0 - 100.0 fL   MCH 22.6 (*) 26.0 - 34.0 pg   MCHC 30.9  30.0 - 36.0 g/dL   RDW 72.5 (*) 36.6 - 44.0 %   Platelets 431 (*) 150 - 400 K/uL   Neutrophils Relative 66  43 - 77 %   Neutro Abs 5.9  1.7 - 7.7 K/uL   Lymphocytes Relative 28  12 - 46 %   Lymphs Abs 2.5  0.7 - 4.0 K/uL   Monocytes Relative 5  3 - 12 %   Monocytes Absolute 0.5  0.1 - 1.0 K/uL   Eosinophils Relative 1  0 - 5 %   Eosinophils Absolute 0.1  0.0 - 0.7 K/uL   Basophils Relative 0  0 - 1 %   Basophils Absolute 0.0  0.0 - 0.1 K/uL  COMPREHENSIVE METABOLIC PANEL      Component Value Range   Sodium 136  135 - 145 mEq/L   Potassium 4.1  3.5 - 5.1 mEq/L   Chloride 102  96  - 112 mEq/L   CO2 24  19 - 32 mEq/L   Glucose, Bld 105 (*) 70 - 99 mg/dL   BUN 7  6 - 23 mg/dL   Creatinine, Ser 3.47  0.50 - 1.10 mg/dL   Calcium 9.2  8.4 - 42.5 mg/dL   Total Protein 6.6  6.0 - 8.3 g/dL   Albumin 3.0 (*) 3.5 - 5.2 g/dL   AST 13  0 - 37 U/L   ALT 9  0 - 35 U/L   Alkaline Phosphatase 45  39 - 117 U/L   Total Bilirubin 0.2 (*) 0.3 - 1.2 mg/dL   GFR calc non Af Amer >90  >90 mL/min   GFR calc Af Amer >90  >90 mL/min  POCT PREGNANCY, URINE      Component Value Range   Preg Test, Ur POSITIVE (*) NEGATIVE  CK      Component Value Range   Total CK 66  7 - 177  U/L  HCG, QUANTITATIVE, PREGNANCY      Component Value Range   hCG, Beta Chain, Quant, S 51806 (*) <5 mIU/mL  ABO/RH      Component Value Range   ABO/RH(D) O POS     US Ob Comp Less 14 Wks  06/04/2012  *RADIOLOGY REPORT*  Clinical Data: Seizures and positive pregnancy test.  OBSTETRIC <14 WK [place] AND TRANSVAGINAL OB US  Technique:  Both transabdominal and transvaginal ultrasound examinations were performed for complete evaluation of the gestation as well as the maternal uterus, adnexal regions, and pelvic cul-de-sac.  Transvaginal technique was performed to assess early pregnancy.  Comparison:  05/13/2012  Intrauterine gestational sac:  Visualized/normal in shape. Yolk sac: Present Embryo: Present Cardiac Activity: Present Heart Rate: 163 bpm  CRL: 2.2  mm  8 w  6 d  Maternal uterus/adnexae: There may be a tiny subchorionic hemorrhage.  The adnexa tissue could not be visualized due to patient discomfort.  Evidence for a Nabothian cyst in the cervix.  IMPRESSION: Single intrauterine pregnancy.  Gestational age by ultrasound is 8 weeks and 6 days.  Original Report Authenticated By: Richarda Overlie, M.D.   US Ob Comp Less 14 Wks  05/13/2012  OBSTETRICAL ULTRASOUND: This exam was performed within a [facility]. The OB US report was generated in the AS system, and faxed to the ordering physician.   This report  is also available in [employer] and in the [employer]. See AS Obstetric US report.   US Ob Transvaginal  06/04/2012  *RADIOLOGY REPORT*  Clinical Data: Seizures and positive pregnancy test.  OBSTETRIC <14 WK [place] AND TRANSVAGINAL OB US  Technique:  Both transabdominal and transvaginal ultrasound examinations were performed for complete evaluation of the gestation as well as the maternal uterus, adnexal regions, and pelvic cul-de-sac.  Transvaginal technique was performed to assess early pregnancy.  Comparison:  05/13/2012  Intrauterine gestational sac:  Visualized/normal in shape. Yolk sac: Present Embryo: Present Cardiac Activity: Present Heart Rate: 163 bpm  CRL: 2.2  mm  8 w  6 d  Maternal uterus/adnexae: There may be a tiny subchorionic hemorrhage.  The adnexa tissue could not be visualized due to patient discomfort.  Evidence for a Nabothian cyst in the cervix.  IMPRESSION: Single intrauterine pregnancy.  Gestational age by ultrasound is 8 weeks and 6 days.  Original Report Authenticated By: Richarda Overlie, M.D.   US Ob Transvaginal  05/13/2012  OBSTETRICAL ULTRASOUND: This exam was performed within a [facility]. The OB US report was generated in the AS system, and faxed to the ordering physician.   This report is also available in [employer] and in the [employer]. See AS Obstetric US report.       [place], [place] 06/04/12 [phone]

## 2012-06-04 NOTE — ED Notes (Signed)
Requested urine form pt and asked her to notify when she felt the need to void

## 2012-06-04 NOTE — ED Notes (Signed)
IV team at bedside 

## 2012-06-04 NOTE — ED Notes (Signed)
Unable to obtain FHT. RN and charge nurse attempted MD informed. Will continue to monitor.

## 2012-06-04 NOTE — ED Notes (Signed)
Discharge instructions reviewed with patient no questions at this time  

## 2012-06-04 NOTE — ED Notes (Signed)
PA in room to obtain FHT. Will continue to monitor.

## 2012-06-06 ENCOUNTER — Encounter: Payer: Medicare Other | Admitting: Obstetrics and Gynecology

## 2012-06-07 ENCOUNTER — Encounter (HOSPITAL_COMMUNITY): Payer: Self-pay | Admitting: Emergency Medicine

## 2012-06-07 ENCOUNTER — Emergency Department (HOSPITAL_COMMUNITY)
Admission: EM | Admit: 2012-06-07 | Discharge: 2012-06-07 | Disposition: A | Discharge status: Home / Self Care | Payer: Medicare Other | Attending: Emergency Medicine | Admitting: Emergency Medicine

## 2012-06-07 DIAGNOSIS — D649 Anemia, unspecified: Secondary | ICD-10-CM | POA: Insufficient documentation

## 2012-06-07 DIAGNOSIS — R569 Unspecified convulsions: Secondary | ICD-10-CM

## 2012-06-07 DIAGNOSIS — F319 Bipolar disorder, unspecified: Secondary | ICD-10-CM | POA: Insufficient documentation

## 2012-06-07 DIAGNOSIS — Z3490 Encounter for supervision of normal pregnancy, unspecified, unspecified trimester: Secondary | ICD-10-CM

## 2012-06-07 MED ORDER — ACETAMINOPHEN 325 MG PO TABS
650.0000 mg | ORAL_TABLET | Freq: Once | ORAL | Status: AC
  Administered 2012-06-07: 650 mg via ORAL
  Filled 2012-06-07: qty 2

## 2012-06-07 NOTE — ED Provider Notes (Signed)
History     CSN: [id number]  Arrival date & time 06/07/12  2049   First MD Initiated Contact with Patient 06/07/12 2148      Chief Complaint  Patient presents with  . Seizures    (Consider location/radiation/quality/duration/timing/severity/associated sxs/prior treatment) HPI Comments: Patient with history of Seizure disorder presents today because she thinks that she may have had a seizure earlier today. She has been evaluated for this in the ED numerous time in the past.  Seizure was unwitnessed.  She states that she was sitting at the time.  She did not fall or hit her head.  No bowel or bladder incontinence.  She is currently followed by [facility].  She is currently on Keppra 3000 mg daily.  She has been compliant with her medication.  She is also pregnant.  She denies abdominal pain, pelvic pain, vaginal bleeding, vaginal discharge, or passage of tissue.    The history is provided by the patient.    Past Medical History  Diagnosis Date  . Migraines     would be rx'd Percacet for relief  . Seizures 05/2011    unsure what started seizures;Neurology appt on 05/30/12;on Kepra 2000mg  bid;was on Topramax 50mg  tid  . Bipolar affective disorder     no meds currently;has been years since taking meds  . Infection     Yeast inf;not frequent  . Anemia     has taken iron supplements in the past  . Yeast infection   . H/O varicella   . Wears glasses     Past Surgical History  Procedure Date  . Cesarean section   . Wisdom tooth extraction     All 4 removed  . Tonsillectomy     During high school years    Family History  Problem Relation Age of Onset  . Other Neg Hx   . Cancer Mother     Ovarian  . Diabetes Mother   . Hypertension Father   . Cirrhosis Father   . Hypertension Brother   . Asthma Son     History  Substance Use Topics  . Smoking status: Never Smoker   . Smokeless tobacco: Never Used  . Alcohol Use: No    OB History    Grav Para Term  Preterm Abortions TAB SAB Ect Mult Living   2 1 1       1       Review of Systems  Constitutional: Negative for fever and chills.  Eyes: Negative for visual disturbance.  Genitourinary:       No urinary or bowel incontinence  Neurological: Positive for seizures. Negative for dizziness, syncope, light-headedness, numbness and headaches.  Psychiatric/Behavioral: Negative for confusion.    Allergies  Review of patient's allergies indicates no known allergies.  Home Medications   Current Outpatient Rx  Name Route Sig Dispense Refill  . CEFUROXIME AXETIL 500 MG PO TABS Oral Take 500 mg by mouth 2 (two) times daily.    Marland Kitchen LEVETIRACETAM ER 750 MG PO TB24 Oral Take 3,000 mg by mouth daily.    Marland Kitchen PRENATAL VITAMINS (DIS) PO TABS Oral Take 1 tablet by mouth daily.      BP 139/101  Pulse 90  Temp 97.4 F (36.3 C) (Oral)  Resp 18  SpO2 99%  LMP 03/28/2012  Physical Exam  Nursing note and vitals reviewed. Constitutional: She appears well-developed and well-nourished.  HENT:  Head: Normocephalic and atraumatic.  Mouth/Throat: Oropharynx is clear and moist.  Eyes: EOM are normal.  Pupils are equal, round, and reactive to light.  Neck: Normal range of motion. Neck supple.  Cardiovascular: Normal rate, regular rhythm and normal heart sounds.   Pulmonary/Chest: Effort normal and breath sounds normal.  Neurological: She is alert. She has normal strength. No cranial nerve deficit or sensory deficit.  Skin: Skin is warm, dry and intact. No abrasion, no bruising and no ecchymosis noted.  Psychiatric: She has a normal mood and affect.    ED Course  Procedures (including critical care time)  Labs Reviewed  GLUCOSE, CAPILLARY - Abnormal; Notable for the following:    Glucose-Capillary 123 (*)     All other components within normal limits   No results found.   No diagnosis found.    MDM  Patient presenting after possible seizure.  Patient is back to baseline at this time.  She has a  Insurance account manager.  No head trauma.  She refused labs.  Patient discharged home and instructed to follow up with Neurologist regarding the frequency of seizures.        Pascal Lux [place], PA-C 06/09/12 1233

## 2012-06-07 NOTE — ED Notes (Signed)
Report given EMS. Pt c/o unwitnessed seizure while in vehicle in parking lot at 2000. No postictal stage. No trauma to tongue. No urinary incontinence. Initial VS CBG 127 BP 115/85 Pulse 90 RR 22 at 2045.  2 months pregnant. Pt reports being compliant with medication, but "they keep switching it". Pt reports "you might to get the [employer] here, because I'm about to act a damn fool".

## 2012-06-07 NOTE — ED Notes (Signed)
[id number]<AV> Expected date:<BR> Expected time:<BR> Means of arrival:<BR> Comments:<BR> EMS-Seizure/68months pregnant/hx of same

## 2012-06-09 ENCOUNTER — Ambulatory Visit (HOSPITAL_COMMUNITY): Payer: Medicare Other | Attending: Obstetrics and Gynecology

## 2012-06-11 NOTE — ED Provider Notes (Signed)
Medical screening examination/treatment/procedure(s) were performed by non-physician practitioner and as supervising physician I was immediately available for consultation/collaboration.  Flint Melter, MD 06/11/12 0110

## 2012-06-17 ENCOUNTER — Encounter (HOSPITAL_COMMUNITY): Payer: Self-pay | Admitting: Family Medicine

## 2012-06-17 ENCOUNTER — Emergency Department (HOSPITAL_COMMUNITY)
Admission: EM | Admit: 2012-06-17 | Discharge: 2012-06-17 | Disposition: A | Discharge status: Home / Self Care | Payer: Medicare Other | Attending: Emergency Medicine | Admitting: Emergency Medicine

## 2012-06-17 DIAGNOSIS — G40909 Epilepsy, unspecified, not intractable, without status epilepticus: Secondary | ICD-10-CM | POA: Insufficient documentation

## 2012-06-17 MED ORDER — ACETAMINOPHEN 325 MG PO TABS
650.0000 mg | ORAL_TABLET | Freq: Once | ORAL | Status: AC
  Administered 2012-06-17: 650 mg via ORAL
  Filled 2012-06-17: qty 2

## 2012-06-17 NOTE — ED Notes (Signed)
Attempted IV start. Unable to obtain IV access. Pt states she is usually a hard stick and IV team must be called.

## 2012-06-17 NOTE — ED Notes (Signed)
Per EMS: Pt from home. Reports hx of seizures. States she is on keppra and taking it like she is supposed to.  States she had an unwitnessed seizure this afternoon. No oral injury. No incontinence. Pt ambulatory at scene. Pt is A/O x4. NAD noted at this time. Pt is 3 months pregnant.

## 2012-06-17 NOTE — ED Notes (Signed)
Pt reports having unwitnessed seizure earlier today. Denies oral injury. Reports pain rated 8/10 to left shoulder and headache. Reports intermittent numbness to left foot. NAD noted at this time.

## 2012-06-17 NOTE — ED Notes (Signed)
[id number]<AV> Expected date:06/17/12<BR> Expected time: 7:04 PM<BR> Means of arrival:Ambulance<BR> Comments:<BR> Seizure/pregnant

## 2012-06-17 NOTE — ED Notes (Signed)
Pt refusing blood work. Dr. Freida Busman notified.

## 2012-06-17 NOTE — ED Notes (Signed)
Went to collect labs - pt refused.  

## 2012-06-17 NOTE — ED Provider Notes (Addendum)
History     CSN: [id number]  Arrival date & time 06/17/12  1918   First MD Initiated Contact with Patient 06/17/12 2023      Chief Complaint  Patient presents with  . Seizures; Pt pregnant     (Consider location/radiation/quality/duration/timing/severity/associated sxs/prior treatment) The history is provided by the patient.   patient here after having an unwitnessed seizure at home. Hasn't seen multiple times for same. No compliance with her current seizure medications. She is approximately 3 months pregnant and denies any vaginal bleeding or discharge or abdominal pain. Recent fevers, neck pain or photophobia. Denies any urinary incontinence or or injury. She is back to her baseline at this time  Past Medical History  Diagnosis Date  . Migraines     would be rx'd Percacet for relief  . Seizures 05/2011    unsure what started seizures;Neurology appt on 05/30/12;on Kepra 2000mg  bid;was on Topramax 50mg  tid  . Bipolar affective disorder     no meds currently;has been years since taking meds  . Infection     Yeast inf;not frequent  . Anemia     has taken iron supplements in the past  . Yeast infection   . H/O varicella   . Wears glasses     Past Surgical History  Procedure Date  . Cesarean section   . Wisdom tooth extraction     All 4 removed  . Tonsillectomy     During high school years    Family History  Problem Relation Age of Onset  . Other Neg Hx   . Cancer Mother     Ovarian  . Diabetes Mother   . Hypertension Father   . Cirrhosis Father   . Hypertension Brother   . Asthma Son     History  Substance Use Topics  . Smoking status: Never Smoker   . Smokeless tobacco: Never Used  . Alcohol Use: No    OB History    Grav Para Term Preterm Abortions TAB SAB Ect Mult Living   2 1 1       1       Review of Systems  All other systems reviewed and are negative.    Allergies  Review of patient's allergies indicates no known allergies.  Home  Medications   Current Outpatient Rx  Name Route Sig Dispense Refill  . LEVETIRACETAM ER 750 MG PO TB24 Oral Take 3,000 mg by mouth daily.    Marland Kitchen NITROFURANTOIN MACROCRYSTAL 100 MG PO CAPS Oral Take 100 mg by mouth 4 (four) times daily. Urinary tract infection    . PRENATAL VITAMINS (DIS) PO TABS Oral Take 1 tablet by mouth daily.      BP 109/67  Pulse 102  Temp 98 F (36.7 C) (Oral)  Resp 14  SpO2 100%  LMP 03/28/2012  Physical Exam  Nursing note and vitals reviewed. Constitutional: She is oriented to person, place, and time. She appears well-developed and well-nourished.  Non-toxic appearance. No distress.  HENT:  Head: Normocephalic and atraumatic.  Eyes: Conjunctivae, EOM and lids are normal. Pupils are equal, round, and reactive to light.  Neck: Normal range of motion. Neck supple. No tracheal deviation present. No mass present.  Cardiovascular: Normal rate, regular rhythm and normal heart sounds.  Exam reveals no gallop.   No murmur heard. Pulmonary/Chest: Effort normal and breath sounds normal. No stridor. No respiratory distress. She has no decreased breath sounds. She has no wheezes. She has no rhonchi. She has no rales.  Abdominal: Soft. Normal appearance and bowel sounds are normal. She exhibits no distension. There is no tenderness. There is no rebound and no CVA tenderness.  Musculoskeletal: Normal range of motion. She exhibits no edema and no tenderness.  Neurological: She is alert and oriented to person, place, and time. She has normal strength. No cranial nerve deficit or sensory deficit. GCS eye subscore is 4. GCS verbal subscore is 5. GCS motor subscore is 6.  Skin: Skin is warm and dry. No abrasion and no rash noted.  Psychiatric: She has a normal mood and affect. Her speech is normal and behavior is normal.    ED Course  Procedures (including critical care time)   Labs Reviewed  BASIC METABOLIC PANEL   No results found.   No diagnosis found.    MDM    Patient offered blood work at this time and has deferred. States that she's under increased stress and believes that that is the cause of her current symptoms are related to followup with her neurologist at [facility]    Date: 07/27/2012  Rate: 88  Rhythm: normal sinus rhythm  QRS Axis: normal  Intervals: normal  ST/T Wave abnormalities: normal  Conduction Disutrbances:none  Narrative Interpretation:   Old EKG Reviewed: none       Toy Baker, MD 06/17/12 2109  Toy Baker, MD 07/27/12 [phone]

## 2012-06-24 ENCOUNTER — Inpatient Hospital Stay (HOSPITAL_COMMUNITY): Payer: Medicare Other

## 2012-06-24 ENCOUNTER — Encounter (HOSPITAL_COMMUNITY): Payer: Self-pay

## 2012-06-24 ENCOUNTER — Inpatient Hospital Stay (HOSPITAL_COMMUNITY)
Admission: AD | Admit: 2012-06-24 | Discharge: 2012-06-24 | Disposition: A | Discharge status: Home / Self Care | Payer: Medicare Other | Source: Ambulatory Visit | Attending: Obstetrics and Gynecology | Admitting: Obstetrics and Gynecology

## 2012-06-24 ENCOUNTER — Other Ambulatory Visit: Payer: Self-pay

## 2012-06-24 ENCOUNTER — Ambulatory Visit (HOSPITAL_COMMUNITY): Admission: RE | Admit: 2012-06-24 | Payer: Medicare Other | Source: Ambulatory Visit

## 2012-06-24 DIAGNOSIS — Z331 Pregnant state, incidental: Secondary | ICD-10-CM

## 2012-06-24 DIAGNOSIS — R109 Unspecified abdominal pain: Secondary | ICD-10-CM

## 2012-06-24 DIAGNOSIS — R51 Headache: Secondary | ICD-10-CM | POA: Insufficient documentation

## 2012-06-24 DIAGNOSIS — R079 Chest pain, unspecified: Secondary | ICD-10-CM | POA: Insufficient documentation

## 2012-06-24 DIAGNOSIS — G43909 Migraine, unspecified, not intractable, without status migrainosus: Secondary | ICD-10-CM

## 2012-06-24 DIAGNOSIS — O2340 Unspecified infection of urinary tract in pregnancy, unspecified trimester: Secondary | ICD-10-CM

## 2012-06-24 DIAGNOSIS — G40909 Epilepsy, unspecified, not intractable, without status epilepticus: Secondary | ICD-10-CM

## 2012-06-24 DIAGNOSIS — O99891 Other specified diseases and conditions complicating pregnancy: Secondary | ICD-10-CM | POA: Insufficient documentation

## 2012-06-24 DIAGNOSIS — Z98891 History of uterine scar from previous surgery: Secondary | ICD-10-CM

## 2012-06-24 DIAGNOSIS — R110 Nausea: Secondary | ICD-10-CM

## 2012-06-24 DIAGNOSIS — F319 Bipolar disorder, unspecified: Secondary | ICD-10-CM

## 2012-06-24 DIAGNOSIS — R0602 Shortness of breath: Secondary | ICD-10-CM | POA: Insufficient documentation

## 2012-06-24 LAB — CBC
HCT: 33.1 % — ABNORMAL LOW (ref 36.0–46.0)
Hemoglobin: 10.2 g/dL — ABNORMAL LOW (ref 12.0–15.0)
MCH: 22.2 pg — ABNORMAL LOW (ref 26.0–34.0)
MCHC: 30.8 g/dL (ref 30.0–36.0)
RDW: 16.5 % — ABNORMAL HIGH (ref 11.5–15.5)

## 2012-06-24 LAB — COMPREHENSIVE METABOLIC PANEL
Albumin: 2.9 g/dL — ABNORMAL LOW (ref 3.5–5.2)
BUN: 7 mg/dL (ref 6–23)
Calcium: 9.2 mg/dL (ref 8.4–10.5)
Creatinine, Ser: 0.58 mg/dL (ref 0.50–1.10)
GFR calc Af Amer: >90 mL/min (ref >90)
Glucose, Bld: 100 mg/dL — ABNORMAL HIGH (ref 70–99)
Total Protein: 6.8 g/dL (ref 6.0–8.3)

## 2012-06-24 LAB — TROPONIN I: Troponin I: <0.3 ng/mL (ref <0.30)

## 2012-06-24 MED ORDER — PROMETHAZINE HCL 12.5 MG PO TABS: 25.0000 mg | ORAL_TABLET | Freq: Four times a day (QID) | ORAL | Status: DC | PRN

## 2012-06-24 MED ORDER — OXYCODONE-ACETAMINOPHEN 5-325 MG PO TABS
2.0000 | ORAL_TABLET | Freq: Once | ORAL | Status: AC
  Administered 2012-06-24: 2 via ORAL
  Filled 2012-06-24: qty 2

## 2012-06-24 NOTE — MAU Note (Signed)
Called Paulino Door CNM with lab results will consult Dr. Su Hilt and call back to plan.

## 2012-06-24 NOTE — MAU Note (Signed)
O2 at 4L via [place]

## 2012-06-24 NOTE — MAU Note (Signed)
Headaches, nauseated, dizziness and can't sleep- going on for a couple days. Getting worse. (hx of seizures)  Keeps getting hot and cold.  Eyes are killing me.  Whole left side keeps going numb- can't remember how long this has been going on.  Left arm has been hurting, can't really move it.  Keeps having cramps in lower stomach, from time to time - pains go all over stomach.

## 2012-06-24 NOTE — MAU Note (Signed)
Did not call doctors office, usually she comes in by EMS when she has had a seizure... She never calls, she just comes.  Also been having chest pain, feels like can't get breath.

## 2012-06-24 NOTE — MAU Provider Note (Signed)
History   Ms Fredenburg presented with a History of headache, Shortness of breath, Chest pain and numbness for past 4 - 6 hrs. [redacted]w[redacted]d Has been an established patient at [facility] and has a appointment 07/05/12.  CSN: [id number]  Arrival date and time: 06/24/12 1544   First Provider Initiated Contact with Patient 06/24/12 1644      Chief Complaint  Patient presents with  . Headache  . Nausea  . Abdominal Pain   HPIMs Derrell Lolling   OB History    Grav Para Term Preterm Abortions TAB SAB Ect Mult Living   2 1 1       1       Past Medical History  Diagnosis Date  . Migraines     would be rx'd Percacet for relief  . Seizures 05/2011    unsure what started seizures;Neurology appt on 05/30/12;on Kepra 2000mg  bid;was on Topramax 50mg  tid  . Bipolar affective disorder     no meds currently;has been years since taking meds  . Infection     Yeast inf;not frequent  . Anemia     has taken iron supplements in the past  . Yeast infection   . H/O varicella   . Wears glasses     Past Surgical History  Procedure Date  . Cesarean section   . Wisdom tooth extraction     All 4 removed  . Tonsillectomy     During high school years    Family History  Problem Relation Age of Onset  . Other Neg Hx   . Cancer Mother     Ovarian  . Diabetes Mother   . Hypertension Father   . Cirrhosis Father   . Hypertension Brother   . Asthma Son     History  Substance Use Topics  . Smoking status: Never Smoker   . Smokeless tobacco: Never Used  . Alcohol Use: No    Allergies: No Known Allergies  Prescriptions prior to admission  Medication Sig Dispense Refill  . acetaminophen (TYLENOL) 500 MG tablet Take 1,000 mg by mouth every 6 (six) hours as needed. For pain      . Levetiracetam (KEPPRA XR) 750 MG TB24 Take 1,500 mg by mouth 2 (two) times daily.       . Prenatal Vit-Fe Fumarate-FA (PRENATAL MULTIVITAMIN) TABS Take 1 tablet by mouth daily.        Review of Systems  Constitutional: Negative.     HENT:       Frontal headache  Eyes: Negative.   Respiratory: Negative.   Cardiovascular: Positive for chest pain.       Chest pain on admission  Gastrointestinal: Negative.   Musculoskeletal: Negative.   Skin: Negative.   Neurological: Positive for tingling and headaches.       Tingling down right arm.  Endo/Heme/Allergies: Negative.   Psychiatric/Behavioral: Positive for depression. Negative for substance abuse.       Hx Seizure Disorder Hx of Bipolar Depression   Physical Exam   Blood pressure 140/76, pulse 104, temperature 98.6 F (37 C), temperature source Oral, resp. rate 20, height 5\' 2"  (1.575 m), weight 343 lb (155.584 kg), last menstrual period 03/28/2012, SpO2 100.00%.  Physical Exam  Constitutional: She appears well-developed and well-nourished.  HENT:  Head: Normocephalic and atraumatic.  Eyes: Pupils are equal, round, and reactive to light.  Neck: Normal range of motion.  Cardiovascular: Normal rate, regular rhythm and normal heart sounds.   Respiratory: Effort normal and breath sounds normal.  Breathlessness in the past few days.  GI: Soft. Bowel sounds are normal. There is tenderness.       Rt  Sided Lower rt quadrant pain  Genitourinary: Vagina normal and uterus normal.  Musculoskeletal:       General aches and pains.  Skin: Skin is warm and dry.  Psychiatric: She has a normal mood and affect.    MAU Course  Procedures EKG : Normal sinus rythm CBC: Norma; CMP: Normal Troponin:  Neg OB USS < than 14 weeks - normal for gestation Cardiac Activity around 150 bpm - full report ending. Analgesia: Percocet (5/325) 2 tablets po x 1 dose. CXR: bilateral atelectasis  - neg   Assessment and Plan  The pain radiating down Left arm has been r/o for MI The breathlessness:  C/w Atelectasis Bilaterally and advised to take deep breaths at least 5/hr and start moving and walking. EKG was normal - patient reassured  OB USS: established that gestation of [redacted]w[redacted]d  is correct to dates and that the IUP is ongoing. The patient has been advised that she has been cleared from an Ob stand point and that she has been strongly advised to f/u with her Neurologist [facility] [place], [employer]. The patient had also attended [facility]. The patient has a f/u appointment 07/05/12 at [facility] Advised to get a release on Neurology documents to bring to [facility] to assist in her care. The patient's condition was stable at time of discharge. Earl Gala, CNM.    Kamisha Ell 06/24/2012, 7:53 PM

## 2012-06-24 NOTE — MAU Note (Signed)
Patient requesting pain medication called Earl Gala CNM received orders explained to patient will be another 30 minutes before all her lab work is back.

## 2012-06-26 ENCOUNTER — Encounter (HOSPITAL_COMMUNITY): Payer: Self-pay | Admitting: *Deleted

## 2012-06-26 ENCOUNTER — Emergency Department (HOSPITAL_COMMUNITY)
Admission: EM | Admit: 2012-06-26 | Discharge: 2012-06-26 | Disposition: A | Discharge status: Home / Self Care | Payer: Medicare Other | Attending: Emergency Medicine | Admitting: Emergency Medicine

## 2012-06-26 DIAGNOSIS — Z331 Pregnant state, incidental: Secondary | ICD-10-CM | POA: Insufficient documentation

## 2012-06-26 DIAGNOSIS — R079 Chest pain, unspecified: Secondary | ICD-10-CM | POA: Insufficient documentation

## 2012-06-26 DIAGNOSIS — R202 Paresthesia of skin: Secondary | ICD-10-CM

## 2012-06-26 DIAGNOSIS — Z3490 Encounter for supervision of normal pregnancy, unspecified, unspecified trimester: Secondary | ICD-10-CM

## 2012-06-26 DIAGNOSIS — R209 Unspecified disturbances of skin sensation: Secondary | ICD-10-CM | POA: Insufficient documentation

## 2012-06-26 DIAGNOSIS — M79609 Pain in unspecified limb: Secondary | ICD-10-CM | POA: Insufficient documentation

## 2012-06-26 DIAGNOSIS — M79602 Pain in left arm: Secondary | ICD-10-CM

## 2012-06-26 LAB — COMPREHENSIVE METABOLIC PANEL
ALT: 9 U/L (ref 0–35)
AST: 10 U/L (ref 0–37)
Calcium: 9.6 mg/dL (ref 8.4–10.5)
Creatinine, Ser: 0.65 mg/dL (ref 0.50–1.10)
GFR calc Af Amer: >90 mL/min (ref >90)
GFR calc non Af Amer: >90 mL/min (ref >90)
Glucose, Bld: 110 mg/dL — ABNORMAL HIGH (ref 70–99)
Total Protein: 6.9 g/dL (ref 6.0–8.3)

## 2012-06-26 LAB — CBC WITH DIFFERENTIAL/PLATELET
Basophils Relative: 0 % (ref 0–1)
Hemoglobin: 10.7 g/dL — ABNORMAL LOW (ref 12.0–15.0)
Lymphocytes Relative: 30 % (ref 12–46)
Lymphs Abs: 2.9 10*3/uL (ref 0.7–4.0)
MCHC: 30.8 g/dL (ref 30.0–36.0)
Monocytes Relative: 5 % (ref 3–12)
Neutro Abs: 6.5 10*3/uL (ref 1.7–7.7)
Neutrophils Relative %: 65 % (ref 43–77)
RBC: 4.8 MIL/uL (ref 3.87–5.11)
WBC: 10 10*3/uL (ref 4.0–10.5)

## 2012-06-26 LAB — URINALYSIS, ROUTINE W REFLEX MICROSCOPIC
Bilirubin Urine: NEGATIVE
Nitrite: NEGATIVE
Specific Gravity, Urine: 1.021 (ref 1.005–1.030)
Urobilinogen, UA: 0.2 mg/dL (ref 0.0–1.0)
pH: 7 (ref 5.0–8.0)

## 2012-06-26 LAB — POCT I-STAT TROPONIN I: Troponin i, poc: 0 ng/mL (ref 0.00–0.08)

## 2012-06-26 LAB — URINE MICROSCOPIC-ADD ON

## 2012-06-26 MED ORDER — ACETAMINOPHEN 325 MG PO TABS
975.0000 mg | ORAL_TABLET | Freq: Once | ORAL | Status: AC
  Administered 2012-06-26: 975 mg via ORAL
  Filled 2012-06-26: qty 3

## 2012-06-26 MED ORDER — ACETAMINOPHEN 500 MG PO TABS: 1000.0000 mg | ORAL_TABLET | Freq: Once | ORAL | Status: DC

## 2012-06-26 NOTE — ED Provider Notes (Signed)
History     CSN: [id number]  Arrival date & time 06/26/12  0011   First MD Initiated Contact with Patient 06/26/12 0231      Chief Complaint  Patient presents with  . Generalized Body Aches    (Consider location/radiation/quality/duration/timing/severity/associated sxs/prior treatment) HPI Comments: [name] [name] is a 31 y.o. female who is here for evaluation of chest pain for 4 days with radiation to the left arm. She has a tight feeling in her chest as well as her left arm. She denies fever, chills, nausea, vomiting. She is [redacted] weeks pregnant. She was evaluated at MAU on 06/24/12. At that time, she had chest pain. Her evaluation that was negative and she was instructed to followup with her neurologist for headache. The patient continues to have headaches, and a numb sensation in her left side. She also occasionally has numbness in her right side. She states that she has 2 or 3 seizures each week. She was converted to Keppra for the pregnancy, as her antiepileptic medication.    The history is provided by the patient.    Past Medical History  Diagnosis Date  . Migraines     would be rx'd Percacet for relief  . Seizures 05/2011    unsure what started seizures;Neurology appt on 05/30/12;on Kepra 2000mg  bid;was on Topramax 50mg  tid  . Bipolar affective disorder     no meds currently;has been years since taking meds  . Infection     Yeast inf;not frequent  . Anemia     has taken iron supplements in the past  . Yeast infection   . H/O varicella   . Wears glasses     Past Surgical History  Procedure Date  . Cesarean section   . Wisdom tooth extraction     All 4 removed  . Tonsillectomy     During high school years    Family History  Problem Relation Age of Onset  . Other Neg Hx   . Cancer Mother     Ovarian  . Diabetes Mother   . Hypertension Father   . Cirrhosis Father   . Hypertension Brother   . Asthma Son     History  Substance Use Topics  . Smoking status:  Never Smoker   . Smokeless tobacco: Never Used  . Alcohol Use: No    OB History    Grav Para Term Preterm Abortions TAB SAB Ect Mult Living   2 1 1       1       Review of Systems  All other systems reviewed and are negative.    Allergies  Review of patient's allergies indicates no known allergies.  Home Medications   Current Outpatient Rx  Name Route Sig Dispense Refill  . LEVETIRACETAM ER 750 MG PO TB24 Oral Take 750 mg by mouth 4 (four) times daily.     Marland Kitchen PRENATAL MULTIVITAMIN CH Oral Take 1 tablet by mouth daily.      BP 127/62  Pulse 92  Temp 98.1 F (36.7 C) (Oral)  Resp 20  SpO2 99%  LMP 03/28/2012  Physical Exam  Nursing note and vitals reviewed. Constitutional: She is oriented to person, place, and time. She appears well-developed.       She is obese  HENT:  Head: Normocephalic and atraumatic.  Eyes: Conjunctivae and EOM are normal. Pupils are equal, round, and reactive to light.  Neck: Normal range of motion and phonation normal. Neck supple.  Cardiovascular: Normal rate, regular rhythm  and intact distal pulses.   Pulmonary/Chest: Effort normal and breath sounds normal. She exhibits no tenderness.  Abdominal: Soft. She exhibits no distension. There is no tenderness. There is no guarding.       Unable to appreciate uterine fundus on exam. There is no suprapubic tenderness  Musculoskeletal: Normal range of motion. She exhibits no edema and no tenderness.       There is mild, diffuse paravertebral muscular tenderness of the thoracic and lumbar spine  Neurological: She is alert and oriented to person, place, and time. She has normal strength. She exhibits normal muscle tone.  Skin: Skin is warm and dry.  Psychiatric: She has a normal mood and affect. Her behavior is normal. Judgment and thought content normal.    ED Course  Procedures (including critical care time)\  Blood pressure, elevated on arrival, 140/96. Blood pressure rechecked at 03:25-  127/62     Date: 06/26/2012  Rate: 91  Rhythm: normal sinus rhythm  QRS Axis: normal  Intervals: normal  ST/T Wave abnormalities: normal  Conduction Disutrbances:none  Narrative Interpretation:   Old EKG Reviewed: unchanged  Labs Reviewed  CBC WITH DIFFERENTIAL - Abnormal; Notable for the following:    Hemoglobin 10.7 (*)     HCT 34.7 (*)     MCV 72.3 (*)     MCH 22.3 (*)     RDW 16.4 (*)     All other components within normal limits  COMPREHENSIVE METABOLIC PANEL - Abnormal; Notable for the following:    Glucose, Bld 110 (*)     Albumin 2.9 (*)     Total Bilirubin 0.1 (*)     All other components within normal limits  URINALYSIS, ROUTINE W REFLEX MICROSCOPIC - Abnormal; Notable for the following:    APPearance CLOUDY (*)     Leukocytes, UA SMALL (*)     All other components within normal limits  URINE MICROSCOPIC-ADD ON - Abnormal; Notable for the following:    Squamous Epithelial / LPF MANY (*)     Bacteria, UA MANY (*)     All other components within normal limits  POCT I-STAT TROPONIN I  URINE CULTURE   No results found.   1. Chest pain   2. Left arm pain   3. Paresthesia   4. Pregnancy       MDM  Nonspecific chest pain. First trimester pregnancy, without apparent complication. Doubt ACS, PE, pneumonia, metabolic instability, or occult infection. A urine culture has been sent for the possibility of a urinary tract infection. Patient stable for discharge with outpatient management. She is advised to followup with her obstetrician as scheduled, and her neurologist, for the ongoing numbness and headaches.      Plan: Home Medications- Tylenol prn pain; Home Treatments- rest; Recommended follow up- OB as scheduled. Neurology in 1-2 weeks.  Flint Melter, MD 06/26/12 [phone]

## 2012-06-26 NOTE — ED Notes (Signed)
The pt has multiple complaints she is [redacted] weeks pregnant and she has had some lt sided chest pain for 3-4 days and she has some rt arm numbness.  She reports that she has seizures and no one knows  Why she has them .  [facility]  Was fine

## 2012-06-26 NOTE — ED Notes (Signed)
Per EMS: [facility], pt is [redacted] weeks pregnant. Pt states that she had an EKG, Blood work and was discharged. Pt still having vauge upper body pain today. Pt alert and oriented, able to move extremities and follow commands

## 2012-06-26 NOTE — ED Notes (Signed)
The pt requesting 02 she reports she cannot breathe intermitttently.  Sat monitor applied.  99% on room air.  No distress no 02  Applied and reason given tom the pt.

## 2012-06-28 LAB — URINE CULTURE: Colony Count: 75000

## 2012-06-29 NOTE — ED Notes (Signed)
Chart sent to EDP office for review °

## 2012-07-01 NOTE — ED Notes (Signed)
Chart returned from EDP office. Prescribed Keflex 500 mg PO BID x 7 days. Must follow-up with OB/GYN this week (Patient is pregnant). Prescribed/reviewed by Trixie Dredge PA-C.

## 2012-07-02 NOTE — ED Notes (Signed)
Patient called back and was informed of +Urine and new Rx. Wants Rx called to [facility] on [address]

## 2012-07-02 NOTE — ED Notes (Signed)
Left voicemail for patient to call back. 

## 2012-07-05 ENCOUNTER — Encounter: Payer: Self-pay | Admitting: Obstetrics and Gynecology

## 2012-07-05 ENCOUNTER — Ambulatory Visit (INDEPENDENT_AMBULATORY_CARE_PROVIDER_SITE_OTHER): Payer: Medicare Other | Admitting: Obstetrics and Gynecology

## 2012-07-05 VITALS — BP 124/82 | Wt 337.0 lb

## 2012-07-05 DIAGNOSIS — Z331 Pregnant state, incidental: Secondary | ICD-10-CM

## 2012-07-05 DIAGNOSIS — D649 Anemia, unspecified: Secondary | ICD-10-CM

## 2012-07-05 DIAGNOSIS — E669 Obesity, unspecified: Secondary | ICD-10-CM

## 2012-07-05 DIAGNOSIS — Z124 Encounter for screening for malignant neoplasm of cervix: Secondary | ICD-10-CM

## 2012-07-05 DIAGNOSIS — Z79899 Other long term (current) drug therapy: Secondary | ICD-10-CM

## 2012-07-05 DIAGNOSIS — O99210 Obesity complicating pregnancy, unspecified trimester: Secondary | ICD-10-CM

## 2012-07-05 LAB — POCT WET PREP (WET MOUNT)

## 2012-07-05 MED ORDER — FERROUS GLUCONATE 225 (27 FE) MG PO TABS: 240.0000 mg | ORAL_TABLET | Freq: Two times a day (BID) | ORAL | Status: DC

## 2012-07-05 NOTE — Patient Instructions (Addendum)
ABCs of Pregnancy A Antepartum care is very important. Be sure you see your doctor and get prenatal care as soon as you think you are pregnant. At this time, you will be tested for infection, genetic abnormalities and potential problems with you and the pregnancy. This is the time to discuss diet, exercise, work, medications, labor, pain medication during labor and the possibility of a cesarean delivery. Ask any questions that may concern you. It is important to see your doctor regularly throughout your pregnancy. Avoid exposure to toxic substances and chemicals - such as cleaning solvents, lead and mercury, some insecticides, and paint. Pregnant women should avoid exposure to paint fumes, and fumes that cause you to feel ill, dizzy or faint. When possible, it is a good idea to have a pre-pregnancy consultation with your caregiver to begin some important recommendations your caregiver suggests such as, taking folic acid, exercising, quitting smoking, avoiding alcoholic beverages, etc. B Breastfeeding is the healthiest choice for both you and your baby. It has many nutritional benefits for the baby and health benefits for the mother. It also creates a very tight and loving bond between the baby and mother. Talk to your doctor, your family and friends, and your employer about how you choose to feed your baby and how they can support you in your decision. Not all birth defects can be prevented, but a woman can take actions that may increase her chance of having a healthy baby. Many birth defects happen very early in pregnancy, sometimes before a woman even knows she is pregnant. Birth defects or abnormalities of any child in your or the father's family should be discussed with your caregiver. Get a good support bra as your breast size changes. Wear it especially when you exercise and when nursing.  C Celebrate the news of your pregnancy with the your spouse/father and family. Childbirth classes are helpful to  take for you and the spouse/father because it helps to understand what happens during the pregnancy, labor and delivery. Cesarean delivery should be discussed with your doctor so you are prepared for that possibility. The pros and cons of circumcision if it is a boy, should be discussed with your pediatrician. Cigarette smoking during pregnancy can result in low birth weight babies. It has been associated with infertility, miscarriages, tubal pregnancies, infant death (mortality) and poor health (morbidity) in childhood. Additionally, cigarette smoking may cause long-term learning disabilities. If you smoke, you should try to quit before getting pregnant and not smoke during the pregnancy. Secondary smoke may also harm a mother and her developing baby. It is a good idea to ask people to stop smoking around you during your pregnancy and after the baby is born. Extra calcium is necessary when you are pregnant and is found in your prenatal vitamin, in dairy products, green leafy vegetables and in calcium supplements. D A healthy diet according to your current weight and height, along with vitamins and mineral supplements should be discussed with your caregiver. Domestic abuse or violence should be made known to your doctor right away to get the situation corrected. Drink more water when you exercise to keep hydrated. Discomfort of your back and legs usually develops and progresses from the middle of the second trimester through to delivery of the baby. This is because of the enlarging baby and uterus, which may also affect your balance. Do not take illegal drugs. Illegal drugs can seriously harm the baby and you. Drink extra fluids (water is best) throughout pregnancy to help   your body keep up with the increases in your blood volume. Drink at least 6 to 8 glasses of water, fruit juice, or milk each day. A good way to know you are drinking enough fluid is when your urine looks almost like clear water or is very light  yellow.  E Eat healthy to get the nutrients you and your unborn baby need. Your meals should include the five basic food groups. Exercise (30 minutes of light to moderate exercise a day) is important and encouraged during pregnancy, if there are no medical problems or problems with the pregnancy. Exercise that causes discomfort or dizziness should be stopped and reported to your caregiver. Emotions during pregnancy can change from being ecstatic to depression and should be understood by you, your partner and your family. F Fetal screening with ultrasound, amniocentesis and monitoring during pregnancy and labor is common and sometimes necessary. Take 400 micrograms of folic acid daily both before, when possible, and during the first few months of pregnancy to reduce the risk of birth defects of the brain and spine. All women who could possibly become pregnant should take a vitamin with folic acid, every day. It is also important to eat a healthy diet with fortified foods (enriched grain products, including cereals, rice, breads, and pastas) and foods with natural sources of folate (orange juice, green leafy vegetables, beans, peanuts, broccoli, asparagus, peas, and lentils). The father should be involved with all aspects of the pregnancy including, the prenatal care, childbirth classes, labor, delivery, and postpartum time. Fathers may also have emotional concerns about being a father, financial needs, and raising a family. G Genetic testing should be done appropriately. It is important to know your family and the father's history. If there have been problems with pregnancies or birth defects in your family, report these to your doctor. Also, genetic counselors can talk with you about the information you might need in making decisions about having a family. You can call a major medical center in your area for help in finding a board-certified genetic counselor. Genetic testing and counseling should be done  before pregnancy when possible, especially if there is a history of problems in the mother's or father's family. Certain ethnic backgrounds are more at risk for genetic defects. H Get familiar with the hospital where you will be having your baby. Get to know how long it takes to get there, the labor and delivery area, and the hospital procedures. Be sure your medical insurance is accepted there. Get your home ready for the baby including, clothes, the baby's room (when possible), furniture and car seat. Hand washing is important throughout the day, especially after handling raw meat and poultry, changing the baby's diaper or using the bathroom. This can help prevent the spread of many bacteria and viruses that cause infection. Your hair may become dry and thinner, but will return to normal a few weeks after the baby is born. Heartburn is a common problem that can be treated by taking antacids recommended by your caregiver, eating smaller meals 5 or 6 times a day, not drinking liquids when eating, drinking between meals and raising the head of your bed 2 to 3 inches. I Insurance to cover you, the baby, doctor and hospital should be reviewed so that you will be prepared to pay any costs not covered by your insurance plan. If you do not have medical insurance, there are usually clinics and services available for you in your community. Take 30 milligrams of iron during   your pregnancy as prescribed by your doctor to reduce the risk of low red blood cells (anemia) later in pregnancy. All women of childbearing age should eat a diet rich in iron. J There should be a joint effort for the mother, father and any other children to adapt to the pregnancy financially, emotionally, and psychologically during the pregnancy. Join a support group for moms-to-be. Or, join a class on parenting or childbirth. Have the family participate when possible. K Know your limits. Let your caregiver know if you experience any of the  following:   Pain of any kind.   Strong cramps.   You develop a lot of weight in a short period of time (5 pounds in 3 to 5 days).   Vaginal bleeding, leaking of amniotic fluid.   Headache, vision problems.   Dizziness, fainting, shortness of breath.   Chest pain.   Fever of 102 F (38.9 C) or higher.   Gush of clear fluid from your vagina.   Painful urination.   Domestic violence.   Irregular heartbeat (palpitations).   Rapid beating of the heart (tachycardia).   Constant feeling sick to your stomach (nauseous) and vomiting.   Trouble walking, fluid retention (edema).   Muscle weakness.   If your baby has decreased activity.   Persistent diarrhea.   Abnormal vaginal discharge.   Uterine contractions at 20-minute intervals.   Back pain that travels down your leg.  L Learn and practice that what you eat and drink should be in moderation and healthy for you and your baby. Legal drugs such as alcohol and caffeine are important issues for pregnant women. There is no safe amount of alcohol a woman can drink while pregnant. Fetal alcohol syndrome, a disorder characterized by growth retardation, facial abnormalities, and central nervous system dysfunction, is caused by a woman's use of alcohol during pregnancy. Caffeine, found in tea, coffee, soft drinks and chocolate, should also be limited. Be sure to read labels when trying to cut down on caffeine during pregnancy. More than 200 foods, beverages, and over-the-counter medications contain caffeine and have a high salt content! There are coffees and teas that do not contain caffeine. M Medical conditions such as diabetes, epilepsy, and high blood pressure should be treated and kept under control before pregnancy when possible, but especially during pregnancy. Ask your caregiver about any medications that may need to be changed or adjusted during pregnancy. If you are currently taking any medications, ask your caregiver if it  is safe to take them while you are pregnant or before getting pregnant when possible. Also, be sure to discuss any herbs or vitamins you are taking. They are medicines, too! Discuss with your doctor all medications, prescribed and over-the-counter, that you are taking. During your prenatal visit, discuss the medications your doctor may give you during labor and delivery. N Never be afraid to ask your doctor or caregiver questions about your health, the progress of the pregnancy, family problems, stressful situations, and recommendation for a pediatrician, if you do not have one. It is better to take all precautions and discuss any questions or concerns you may have during your office visits. It is a good idea to write down your questions before you visit the doctor. O Over-the-counter cough and cold remedies may contain alcohol or other ingredients that should be avoided during pregnancy. Ask your caregiver about prescription, herbs or over-the-counter medications that you are taking or may consider taking while pregnant.  P Physical activity during pregnancy can   benefit both you and your baby by lessening discomfort and fatigue, providing a sense of well-being, and increasing the likelihood of early recovery after delivery. Light to moderate exercise during pregnancy strengthens the belly (abdominal) and back muscles. This helps improve posture. Practicing yoga, walking, swimming, and cycling on a stationary bicycle are usually safe exercises for pregnant women. Avoid scuba diving, exercise at high altitudes (over 3000 feet), skiing, horseback riding, contact sports, etc. Always check with your doctor before beginning any kind of exercise, especially during pregnancy and especially if you did not exercise before getting pregnant. Q Queasiness, stomach upset and morning sickness are common during pregnancy. Eating a couple of crackers or dry toast before getting out of bed. Foods that you normally love may  make you feel sick to your stomach. You may need to substitute other nutritious foods. Eating 5 or 6 small meals a day instead of 3 large ones may make you feel better. Do not drink with your meals, drink between meals. Questions that you have should be written down and asked during your prenatal visits. R Read about and make plans to baby-proof your home. There are important tips for making your home a safer environment for your baby. Review the tips and make your home safer for you and your baby. Read food labels regarding calories, salt and fat content in the food. S Saunas, hot tubs, and steam rooms should be avoided while you are pregnant. Excessive high heat may be harmful during your pregnancy. Your caregiver will screen and examine you for sexually transmitted diseases and genetic disorders during your prenatal visits. Learn the signs of labor. Sexual relations while pregnant is safe unless there is a medical or pregnancy problem and your caregiver advises against it. T Traveling long distances should be avoided especially in the third trimester of your pregnancy. If you do have to travel out of state, be sure to take a copy of your medical records and medical insurance plan with you. You should not travel long distances without seeing your doctor first. Most airlines will not allow you to travel after 36 weeks of pregnancy. Toxoplasmosis is an infection caused by a parasite that can seriously harm an unborn baby. Avoid eating undercooked meat and handling cat litter. Be sure to wear gloves when gardening. Tingling of the hands and fingers is not unusual and is due to fluid retention. This will go away after the baby is born. U Womb (uterus) size increases during the first trimester. Your kidneys will begin to function more efficiently. This may cause you to feel the need to urinate more often. You may also leak urine when sneezing, coughing or laughing. This is due to the growing uterus pressing  against your bladder, which lies directly in front of and slightly under the uterus during the first few months of pregnancy. If you experience burning along with frequency of urination or bloody urine, be sure to tell your doctor. The size of your uterus in the third trimester may cause a problem with your balance. It is advisable to maintain good posture and avoid wearing high heels during this time. An ultrasound of your baby may be necessary during your pregnancy and is safe for you and your baby. V Vaccinations are an important concern for pregnant women. Get needed vaccines before pregnancy. Center for Disease Control (www.cdc.gov) has clear guidelines for the use of vaccines during pregnancy. Review the list, be sure to discuss it with your doctor. Prenatal vitamins are helpful   and healthy for you and the baby. Do not take extra vitamins except what is recommended. Taking too much of certain vitamins can cause overdose problems. Continuous vomiting should be reported to your caregiver. Varicose veins may appear especially if there is a family history of varicose veins. They should subside after the delivery of the baby. Support hose helps if there is leg discomfort. W Being overweight or underweight during pregnancy may cause problems. Try to get within 15 pounds of your ideal weight before pregnancy. Remember, pregnancy is not a time to be dieting! Do not stop eating or start skipping meals as your weight increases. Both you and your baby need the calories and nutrition you receive from a healthy diet. Be sure to consult with your doctor about your diet. There is a formula and diet plan available depending on whether you are overweight or underweight. Your caregiver or nutritionist can help and advise you if necessary. X Avoid X-rays. If you must have dental work or diagnostic tests, tell your dentist or physician that you are pregnant so that extra care can be taken. X-rays should only be taken when  the risks of not taking them outweigh the risk of taking them. If needed, only the minimum amount of radiation should be used. When X-rays are necessary, protective lead shields should be used to cover areas of the body that are not being X-rayed. Y Your baby loves you. Breastfeeding your baby creates a loving and very close bond between the two of you. Give your baby a healthy environment to live in while you are pregnant. Infants and children require constant care and guidance. Their health and safety should be carefully watched at all times. After the baby is born, rest or take a nap when the baby is sleeping. Z Get your ZZZs. Be sure to get plenty of rest. Resting on your side as often as possible, especially on your left side is advised. It provides the best circulation to your baby and helps reduce swelling. Try taking a nap for 30 to 45 minutes in the afternoon when possible. After the baby is born rest or take a nap when the baby is sleeping. Try elevating your feet for that amount of time when possible. It helps the circulation in your legs and helps reduce swelling.  Most information courtesy of the CDC. Document Released: 10/05/2005 Document Revised: 09/24/2011 Document Reviewed: 06/19/2009 [employer] Patient Information 2012 [employer]. 

## 2012-07-05 NOTE — Progress Notes (Signed)
Last Pap: pt cannot recall  Pt requested Genetic Testing. Pt states she is constantly getting headaches and takes tylenol PM.

## 2012-07-07 ENCOUNTER — Encounter: Payer: Self-pay | Admitting: Obstetrics and Gynecology

## 2012-07-07 ENCOUNTER — Encounter (HOSPITAL_COMMUNITY): Payer: Self-pay | Admitting: *Deleted

## 2012-07-07 ENCOUNTER — Telehealth: Payer: Self-pay | Admitting: Obstetrics and Gynecology

## 2012-07-07 ENCOUNTER — Emergency Department (HOSPITAL_COMMUNITY)
Admission: EM | Admit: 2012-07-07 | Discharge: 2012-07-07 | Disposition: A | Discharge status: Home / Self Care | Payer: Medicare Other | Attending: Emergency Medicine | Admitting: Emergency Medicine

## 2012-07-07 DIAGNOSIS — G40909 Epilepsy, unspecified, not intractable, without status epilepticus: Secondary | ICD-10-CM

## 2012-07-07 DIAGNOSIS — Z3490 Encounter for supervision of normal pregnancy, unspecified, unspecified trimester: Secondary | ICD-10-CM

## 2012-07-07 DIAGNOSIS — M549 Dorsalgia, unspecified: Secondary | ICD-10-CM | POA: Insufficient documentation

## 2012-07-07 DIAGNOSIS — R51 Headache: Secondary | ICD-10-CM | POA: Insufficient documentation

## 2012-07-07 DIAGNOSIS — R569 Unspecified convulsions: Secondary | ICD-10-CM | POA: Insufficient documentation

## 2012-07-07 DIAGNOSIS — Z79899 Other long term (current) drug therapy: Secondary | ICD-10-CM | POA: Insufficient documentation

## 2012-07-07 DIAGNOSIS — O99891 Other specified diseases and conditions complicating pregnancy: Secondary | ICD-10-CM | POA: Insufficient documentation

## 2012-07-07 LAB — CBC WITH DIFFERENTIAL/PLATELET
Basophils Absolute: 0 10*3/uL (ref 0.0–0.1)
Basophils Relative: 0 % (ref 0–1)
Eosinophils Absolute: 0.1 10*3/uL (ref 0.0–0.7)
Eosinophils Relative: 1 % (ref 0–5)
HCT: 34.4 % — ABNORMAL LOW (ref 36.0–46.0)
Lymphocytes Relative: 28 % (ref 12–46)
MCH: 22.2 pg — ABNORMAL LOW (ref 26.0–34.0)
MCHC: 31.7 g/dL (ref 30.0–36.0)
MCV: 70.1 fL — ABNORMAL LOW (ref 78.0–100.0)
Monocytes Absolute: 0.4 10*3/uL (ref 0.1–1.0)
Platelets: 478 10*3/uL — ABNORMAL HIGH (ref 150–400)
RDW: 16.4 % — ABNORMAL HIGH (ref 11.5–15.5)
WBC: 9 10*3/uL (ref 4.0–10.5)

## 2012-07-07 LAB — BASIC METABOLIC PANEL
CO2: 22 mEq/L (ref 19–32)
Calcium: 9 mg/dL (ref 8.4–10.5)
Creatinine, Ser: 0.45 mg/dL — ABNORMAL LOW (ref 0.50–1.10)
GFR calc non Af Amer: >90 mL/min (ref >90)
Sodium: 133 mEq/L — ABNORMAL LOW (ref 135–145)

## 2012-07-07 LAB — PAP IG, CT-NG, RFX HPV ASCU
Chlamydia Probe Amp: NEGATIVE
GC Probe Amp: NEGATIVE

## 2012-07-07 LAB — URINALYSIS, ROUTINE W REFLEX MICROSCOPIC
Glucose, UA: NEGATIVE mg/dL
Ketones, ur: NEGATIVE mg/dL
Protein, ur: NEGATIVE mg/dL
Urobilinogen, UA: 0.2 mg/dL (ref 0.0–1.0)

## 2012-07-07 LAB — URINE MICROSCOPIC-ADD ON

## 2012-07-07 MED ORDER — LEVETIRACETAM 750 MG PO TABS
750.0000 mg | ORAL_TABLET | ORAL | Status: AC
Start: 2012-07-07 — End: 2012-07-07
  Administered 2012-07-07: 750 mg via ORAL
  Filled 2012-07-07: qty 1

## 2012-07-07 MED ORDER — LEVETIRACETAM 500 MG PO TABS
750.0000 mg | ORAL_TABLET | ORAL | Status: AC
  Administered 2012-07-07: 750 mg via ORAL
  Filled 2012-07-07: qty 1.5

## 2012-07-07 MED ORDER — ACETAMINOPHEN 325 MG PO TABS
650.0000 mg | ORAL_TABLET | Freq: Once | ORAL | Status: AC
  Administered 2012-07-07: 650 mg via ORAL
  Filled 2012-07-07: qty 2

## 2012-07-07 NOTE — ED Notes (Addendum)
Pt brought in by ems. Hx seizures. Unwitnessed seizure, pt called ems. Went to restroom, then remembers waking up in floor beside bed, so she assumes she had seziure. Pregnant with EDD 12/2012. Pt A&Ox4. No incontinence or tongue trauma noted. Pt c/o aches all over and feeling sleepy, which she says is normal after her seizures. Pt is on keppra, which she says she has been taking as prescribed. ems cbg 95, vitals wnl.

## 2012-07-07 NOTE — ED Provider Notes (Signed)
History     CSN: [id number]  Arrival date & time 07/07/12  1130   First MD Initiated Contact with Patient 07/07/12 1138      Chief Complaint  Patient presents with  . Seizures  . Pregnancy     (Consider location/radiation/quality/duration/timing/severity/associated sxs/prior treatment) HPI Comments: Shamir Sedlar 31 y.o. female   The chief complaint is: Patient presents with:   Seizures   Pregnancy     31 year old female who is approximately [redacted] weeks pregnant presents today with chief complaint of seizure. Family 5 hours ago at home. It was unwitnessed. Patient reports that she was confused for time to time. She states that she has had enough of these at this position hasn't had one. She was sore and she reports that she may have bumped her head. She called the ambulance and was transported here. She has been taking Breath from her neurologist. She states that she has been compliant with medication regimen, but continues to have seizures. She her Keppra. This morning as her seizure occurred over she could take the medication. He diagnosed with urinary tract and fascia infection and started on Keflex. 917. She is approximately one and half days of antibiotic on board. Although the [facility]icipital, [facility] for pregnancy. Previous pregnancy was complicated by obesity, preeclampsia. She was followed for high risk pregnancy. She has history of headaches and blurred vision. She states that her seizures began approximately one year ago  She has no known provoking factors. Denies alcohol Or drug use.Denies fevers, chills, myalgias, arthralgias, nausea, vomiting, diarrhea. Denies CP  Sob. denies Any blood or fluid from vagina. Denies.    Patient is a 31 y.o. female presenting with seizures. The history is provided by the patient and medical records. No language interpreter was used.  Seizures  This is a recurrent problem. The current episode started 3 to 5 hours ago. The problem has been  resolved. There was 1 seizure. The most recent episode lasted 2 to 5 minutes. Associated symptoms include headaches and visual disturbances. Pertinent negatives include no sleepiness, patient does not experience confusion, no speech difficulty, no neck stiffness, no sore throat, no chest pain, no cough, no nausea, no vomiting, no diarrhea and no muscle weakness. Characteristics include rhythmic jerking. Characteristics do not include eye blinking, eye deviation, bowel incontinence, bladder incontinence, loss of consciousness, bit tongue, apnea or cyanosis. Possible causes include recent illness (UTI dx 2 days ago). Possible causes do not include med or dosage change, sleep deprivation or missed seizure meds. There has been no fever. There were no medications administered prior to arrival.    Past Medical History  Diagnosis Date  . Migraines     would be rx'd Percacet for relief  . Seizures 05/2011    unsure what started seizures;Neurology appt on 05/30/12;on Kepra 2000mg  bid;was on Topramax 50mg  tid  . Bipolar affective disorder     no meds currently;has been years since taking meds  . Infection     Yeast inf;not frequent  . Anemia     has taken iron supplements in the past  . Yeast infection   . H/O varicella   . Wears glasses   . Hypertension     Pt states she had pre-eclampsia w last pregnancy     Past Surgical History  Procedure Date  . Cesarean section   . Wisdom tooth extraction     All 4 removed  . Tonsillectomy     During high school years    Family  History  Problem Relation Age of Onset  . Other Neg Hx   . Cancer Mother     Ovarian  . Diabetes Mother   . Hypertension Father   . Cirrhosis Father   . Hypertension Brother   . Asthma Son     History  Substance Use Topics  . Smoking status: Never Smoker   . Smokeless tobacco: Never Used  . Alcohol Use: No    OB History    Grav Para Term Preterm Abortions TAB SAB Ect Mult Living   2 1 1       1       Review of  Systems  Constitutional: Negative for fever, chills and fatigue.  HENT: Negative for sore throat and trouble swallowing.   Eyes: Positive for visual disturbance.  Respiratory: Negative for apnea, cough, chest tightness and shortness of breath.   Cardiovascular: Negative for chest pain, palpitations and cyanosis.  Gastrointestinal: Negative for nausea, vomiting, abdominal pain, diarrhea, constipation and bowel incontinence.  Genitourinary: Negative for bladder incontinence, dysuria, hematuria and flank pain.  Musculoskeletal: Positive for myalgias (post seizure) and back pain. Negative for arthralgias.  Neurological: Positive for seizures, light-headedness and headaches. Negative for dizziness, tremors, loss of consciousness, syncope, facial asymmetry, speech difficulty, weakness and numbness.  Psychiatric/Behavioral: Negative for confusion.    Allergies  Review of patient's allergies indicates no known allergies.  Home Medications   Current Outpatient Rx  Name Route Sig Dispense Refill  . CEPHALEXIN 500 MG PO CAPS Oral Take 500 mg by mouth 2 (two) times daily.     Marland Kitchen LEVETIRACETAM ER 750 MG PO TB24 Oral Take 750 mg by mouth 4 (four) times daily.     Marland Kitchen PRENATAL MULTIVITAMIN CH Oral Take 1 tablet by mouth daily.    Marland Kitchen PROMETHAZINE HCL 12.5 MG PO TABS Oral Take 12.5 mg by mouth every 4 (four) hours as needed. nausea      BP 91/74  Pulse 66  Temp 99 F (37.2 C)  Resp 24  SpO2 99%  LMP 03/28/2012  Physical Exam  Vitals reviewed. Constitutional: She is oriented to person, place, and time. She appears well-developed and well-nourished. No distress.       Obese Female on seizure precautions  HENT:  Head: Normocephalic and atraumatic.  Eyes: Conjunctivae normal and EOM are normal. Pupils are equal, round, and reactive to light. No scleral icterus.  Neck: Normal range of motion. No thyromegaly present.  Cardiovascular: Normal rate, regular rhythm, normal heart sounds and intact distal  pulses.  Exam reveals no gallop and no friction rub.   No murmur heard. Pulmonary/Chest: Effort normal and breath sounds normal. No respiratory distress. She has no wheezes.  Abdominal: Soft. Bowel sounds are normal. She exhibits no distension and no mass. There is no tenderness. There is no guarding.  Musculoskeletal: Normal range of motion. She exhibits no tenderness.  Lymphadenopathy:    She has no cervical adenopathy.  Neurological: She is alert and oriented to person, place, and time. She has normal reflexes. She displays normal reflexes. No cranial nerve deficit. She exhibits normal muscle tone. Coordination normal.  Skin: Skin is warm and dry. No rash noted. She is not diaphoretic.  Psychiatric: She has a normal mood and affect. Her behavior is normal.    ED Course  Procedures (including critical care time)  Labs Reviewed - No data to display No results found.   Date: 07/07/2012  Rate: 94  Rhythm: normal sinus rhythm  QRS Axis: normal  Intervals: normal  ST/T Wave abnormalities: normal  Conduction Disutrbances: none  Narrative Interpretation:   Old EKG Reviewed: No significant changes noted   Obese, pregnant female with history of bipolar affective disorder, migraines, seizures, and currently [redacted] weeks pregnant. Also has known diagnosis of UTI. She states that she denies ever having EEG or MRI of the brain. Co  of headaches and visual changes. Neurologic deficits, deficits at this time. Patient appears well. We'll repeat labs today. Form by Dr. Gerhard Munch. Fetal heart tones are proximally 140. Please see his note for reference   1. Seizure disorder   2. Pregnancy      I spoke with patient's OB provider. He states that patient is scheduled for followup appointment on the 23rd of this month, next Monday. She asked me to contact the patient's neurologist. I spoke with the treatment team and [facility]. I have scheduled a paced a followup appointment for the  patient at 10:45 AM with Dr. Craige Cotta at [facility] for further assessment of her continued seizures. While pregnant. Patient is feeling well at this time. She has had no signs of focal neurologic deficit. She appears well. She feels safe for discharge at this time.  MDM  Discussed reasons to seek immediate care. Patient expresses understanding and agrees with plan.       Arthor Captain, PA-C 07/07/12 2007

## 2012-07-07 NOTE — Progress Notes (Signed)
Subjective:    [name] [name] is being seen today for her first obstetrical visit.  This is not a planned pregnancy. She is at [redacted]w[redacted]d gestation. Her obstetrical history is significant for non-compliance, obesity and seizure disorder, migraines, hx primary c/s for FTP . Relationship with FOB: significant other, living together. "[name] [name]" Pt states her son stays w her mother, but visits occasional. Pt is on permanent disability due to seizures.  Patient unsure about breast feeding Pregnancy history fully reviewed. Pt was seen in ED on 9-8 for chest pain, work up was negative. Urine cx was positive and pt was rx'd keflex, but has not started taking it.  States last seizure about 2wks ago.  Pt did not keep scheduled appt w [facility] in August, will refer again Pt states she had "toxemia" with last pregnancy   Patient reports fatigue, nausea, no bleeding and no cramping.  Review of Systems:   Review of Systems  Gastrointestinal: Positive for nausea.  Genitourinary: Negative for vaginal bleeding.  All other systems reviewed and are negative.    Objective:     BP 124/82  Wt 337 lb (152.862 kg)  LMP 03/28/2012 Physical Exam  Nursing note and vitals reviewed. Constitutional: She is oriented to person, place, and time. She appears well-developed and well-nourished.       Morbidly obese  HENT:  Head: Normocephalic.  Eyes: Pupils are equal, round, and reactive to light.  Neck: Normal range of motion. Neck supple. No thyromegaly present.  Cardiovascular: Normal rate, regular rhythm and normal heart sounds.   Respiratory: Effort normal and breath sounds normal.       Difficult to auscultate in lower lobes   GI: Soft. Bowel sounds are normal.  Genitourinary: Vagina normal.  Musculoskeletal: Normal range of motion. She exhibits edema. She exhibits no tenderness.  Neurological: She is alert and oriented to person, place, and time. She has normal reflexes.  Skin: Skin is warm and dry.  Psychiatric:  She has a normal mood and affect. Her behavior is normal.    Maternal Exam:  Abdomen: Patient reports no abdominal tenderness. Surgical scars: low transverse.   Fundal height is 14.    Introitus: Normal vulva. Normal vagina.  Pelvis: adequate for delivery.   Not proven Cervix: Cervix evaluated by sterile speculum exam and digital exam.     Fetal Exam Fetal Monitor Review: Mode: ultrasound and hand-held doppler probe.   Baseline rate: 150.         Assessment:    Pregnancy: G2P1001 Patient Active Problem List  Diagnosis  . Seizure disorder  . Bipolar disorder  . Migraines  . Morbid obesity with BMI of 60.0-69.9, adult  . Previous cesarean section  . UTI (urinary tract infection) during pregnancy     Urine cx on 9-8 positive   Plan:     Initial labs rv'd Bl type O pos, hgb - 10.5, +urine cx tx'd w keflex, +cx on 9-8, will need TOC after treatment  Prenatal vitamins. Iron supplement Will draw hgbA1C today Plan early 1hr gtt  Wet prep - neg GC/CT sent Pap - sent today  Problem list reviewed and updated. AFP3 discussed: undecided. rv'd w Pt and she will decide by next visit.  Role of ultrasound in pregnancy discussed; fetal survey: will plan anatomy at 20wks at [facility] to morbid obesity. Amniocentesis discussed: not indicated. Follow up in 2 weeks. - to see MD Referral to [facility] for consult Keep scheduled neurology appt w [facility] Pt to sign ROI to  previous records    [name] [name], [place]  07/07/2012

## 2012-07-07 NOTE — ED Notes (Signed)
[id number]<AV> Expected date:<BR> Expected time:<BR> Means of arrival:<BR> Comments:<BR> Seizure, preg EDD 3/14

## 2012-07-07 NOTE — Telephone Encounter (Signed)
Telephone call returned to [facility] who saw pt in [facility] ED today  c/o seizures. Pt is [redacted] wks pregnant, but had no pregnancy concerns. Pt is under the care of a neurologist in [facility] and is on Keppra.  Abigail called to make sure she has a followyup appt with [place].  She does have an appt for 07/11/12 at 230pm with Dr. Su Hilt. I recommended that the pt be assigned a neurology appt ASAP for maximization of her anti seizure medications.  Cammy Copa agreed that she would work on that

## 2012-07-08 ENCOUNTER — Encounter: Payer: Self-pay | Admitting: Obstetrics and Gynecology

## 2012-07-08 DIAGNOSIS — R7309 Other abnormal glucose: Secondary | ICD-10-CM | POA: Insufficient documentation

## 2012-07-08 NOTE — ED Provider Notes (Signed)
Medical screening examination/treatment/procedure(s) were conducted as a shared visit with non-physician practitioner(s) and myself.  I personally evaluated the patient during the encounter  (Case well described by PA Harris)  On my exam she was in no distress.  I performed a BS [place].  PROCEDURE NOTE BS [place] Indication: seizure, pregnancy. Consent: verbal Findings, IUP w good fetal motion, FHT ~130.  No free fluid in abdomen.  I saw the ECG, relevant labs and studies - I agree with the interpretation.  Patient d/c w scheduled f/u.  Gerhard Munch, MD 07/08/12 1524

## 2012-07-11 ENCOUNTER — Encounter: Payer: Medicare Other | Admitting: Obstetrics and Gynecology

## 2012-07-13 ENCOUNTER — Encounter: Payer: Medicare Other | Admitting: Obstetrics and Gynecology

## 2012-07-14 ENCOUNTER — Encounter (HOSPITAL_COMMUNITY): Payer: Self-pay | Admitting: Emergency Medicine

## 2012-07-14 ENCOUNTER — Emergency Department (HOSPITAL_COMMUNITY)
Admission: EM | Admit: 2012-07-14 | Discharge: 2012-07-14 | Disposition: A | Discharge status: Home / Self Care | Payer: Medicare Other | Attending: Emergency Medicine | Admitting: Emergency Medicine

## 2012-07-14 DIAGNOSIS — Z3490 Encounter for supervision of normal pregnancy, unspecified, unspecified trimester: Secondary | ICD-10-CM

## 2012-07-14 DIAGNOSIS — Z79899 Other long term (current) drug therapy: Secondary | ICD-10-CM | POA: Insufficient documentation

## 2012-07-14 DIAGNOSIS — G40909 Epilepsy, unspecified, not intractable, without status epilepticus: Secondary | ICD-10-CM | POA: Insufficient documentation

## 2012-07-14 DIAGNOSIS — R569 Unspecified convulsions: Secondary | ICD-10-CM

## 2012-07-14 LAB — URINALYSIS, ROUTINE W REFLEX MICROSCOPIC
Bilirubin Urine: NEGATIVE
Glucose, UA: NEGATIVE mg/dL
Nitrite: NEGATIVE
Specific Gravity, Urine: 1.03 (ref 1.005–1.030)
pH: 7 (ref 5.0–8.0)

## 2012-07-14 LAB — URINE MICROSCOPIC-ADD ON

## 2012-07-14 NOTE — ED Notes (Signed)
[id number]<AV> Expected date:<BR> Expected time:<BR> Means of arrival:<BR> Comments:<BR> EMS- seizure

## 2012-07-14 NOTE — ED Notes (Signed)
Pt woke up on the floor at her home.  She reports mild confusion, chest pain, and SOB.  Chest pain started several years ago.  History of seizures diagnosed one year ago.  Pt is fifteen weeks pregnant.  Pt sees [address]  No evidence of injury from possible fall.  Pts CBG was 102.

## 2012-07-14 NOTE — ED Provider Notes (Signed)
History     CSN: [id number]  Arrival date & time 07/14/12  1531   First MD Initiated Contact with Patient 07/14/12 1541      Chief Complaint  Patient presents with  . Seizures    (Consider location/radiation/quality/duration/timing/severity/associated sxs/prior treatment) HPI Comments: Patient with a history of seizure disorder who is currently pregnant comes in today with a chief complaint of a seizure that occurred earlier this afternoon.  She reports that the seizure was unwitnessed.  She states that she woke up on the ground and assumed that she must have seized. She is unsure how long she was on the ground. She states that initially she was confused, but her confusion has since resolved.  She denies bowel or bladder incontinence.  She is currently on Keppra and is followed by [facility].  She states that she forgot to take her Keppra today.  She is currently on Keppra 750 mg four times daily.  She had an appointment scheduled with Neurology three days ago, but states that she was unable to go to the appointment because she felt ill.   She is currently [redacted] weeks pregnant and is followed by [facility].  She denies abdominal pain or pelvic pain.  Denies vaginal bleeding.  Denies leakage of vaginal fluid or passage of vaginal tissue.  Patient also states that she is concerned that she may have a UTI and would like to have her urine checked.  She denies dysuria, but states that she has been urinating more frequently.    Patient is a 31 y.o. female presenting with seizures. The history is provided by the patient.  Seizures  Associated symptoms include chest pain. Pertinent negatives include no headaches, no speech difficulty, no visual disturbance, no neck stiffness, no nausea, no vomiting and no diarrhea. There has been no fever. There were no medications administered prior to arrival.    Past Medical History  Diagnosis Date  . Migraines     would be rx'd Percacet for  relief  . Seizures 05/2011    unsure what started seizures;Neurology appt on 05/30/12;on Kepra 2000mg  bid;was on Topramax 50mg  tid  . Bipolar affective disorder     no meds currently;has been years since taking meds  . Infection     Yeast inf;not frequent  . Anemia     has taken iron supplements in the past  . Yeast infection   . H/O varicella   . Wears glasses   . Hypertension     Pt states she had pre-eclampsia w last pregnancy     Past Surgical History  Procedure Date  . Cesarean section   . Wisdom tooth extraction     All 4 removed  . Tonsillectomy     During high school years    Family History  Problem Relation Age of Onset  . Other Neg Hx   . Cancer Mother     Ovarian  . Diabetes Mother   . Hypertension Father   . Cirrhosis Father   . Hypertension Brother   . Asthma Son     History  Substance Use Topics  . Smoking status: Never Smoker   . Smokeless tobacco: Never Used  . Alcohol Use: No    OB History    Grav Para Term Preterm Abortions TAB SAB Ect Mult Living   2 1 1       1       Review of Systems  Constitutional: Negative for fever and chills.  Eyes:  Negative for visual disturbance.  Respiratory: Negative for wheezing.   Cardiovascular: Positive for chest pain.       Chronic chest pain  Gastrointestinal: Negative for nausea, vomiting, abdominal pain and diarrhea.       No bowel or bladder incontinence  Musculoskeletal: Negative for gait problem.  Skin: Negative for wound.  Neurological: Positive for seizures. Negative for dizziness, speech difficulty, light-headedness and headaches.    Allergies  Review of patient's allergies indicates no known allergies.  Home Medications   Current Outpatient Rx  Name Route Sig Dispense Refill  . LEVETIRACETAM ER 750 MG PO TB24 Oral Take 750 mg by mouth 4 (four) times daily.     Marland Kitchen PRENATAL MULTIVITAMIN CH Oral Take 1 tablet by mouth daily.    Marland Kitchen PROMETHAZINE HCL 12.5 MG PO TABS Oral Take 12.5 mg by mouth  every 4 (four) hours as needed. nausea      BP 131/56  Pulse 89  Temp 98.7 F (37.1 C) (Oral)  Wt 338 lb (153.316 kg)  SpO2 99%  LMP 03/28/2012  Physical Exam  Nursing note and vitals reviewed. Constitutional: She appears well-developed and well-nourished. No distress.  HENT:  Head: Normocephalic and atraumatic.  Mouth/Throat: Oropharynx is clear and moist.  Eyes: EOM are normal. Pupils are equal, round, and reactive to light.  Neck: Normal range of motion. Neck supple.  Cardiovascular: Normal rate, regular rhythm and normal heart sounds.   Pulmonary/Chest: Effort normal and breath sounds normal.  Abdominal: Soft. There is no tenderness.  Musculoskeletal: Normal range of motion.  Neurological: She is alert. She has normal strength. No cranial nerve deficit or sensory deficit. Coordination and gait normal.  Skin: Skin is warm and dry. She is not diaphoretic.  Psychiatric: She has a normal mood and affect.    ED Course  Procedures (including critical care time)  Labs Reviewed - No data to display No results found.   No diagnosis found.  Patient refused to have lab work drawn.  Patient evaluated using bedtime ultrasound along with Dr. Juleen China.  Fetal heart tones approximately 140-150 bpm.    MDM  Patient who is [redacted] weeks pregnant with a seizure disorder presents today after having a seizure.  She currently takes Keppra 750 mg four times daily.  She states that she did not take her medication today.  She is followed by [facility], but missed her last appointment.  Fetal heart tones assessed using bedside ultrasound and were approximately 140-150 bpm.  Patient discharged home and instructed to take her medication as prescribed and to follow up with her Neurologist.          Magnus Sinning, PA-C 07/15/12 1017

## 2012-07-15 NOTE — ED Provider Notes (Signed)
Medical screening examination/treatment/procedure(s) were performed by non-physician practitioner and as supervising physician I was immediately available for consultation/collaboration.  Quentina Fronek, MD 07/15/12 1209 

## 2012-07-28 ENCOUNTER — Telehealth: Payer: Self-pay | Admitting: Obstetrics and Gynecology

## 2012-07-28 NOTE — Telephone Encounter (Signed)
Pt called, missed last appt d/t transportation difficulty, pt is 17w 3d c/o lower abd pain.  Pt denies any bldg/abnl d/c.  Pt states that she does not drink much water and has not taken any meds.  Pt advised to push fluids and continue drinking water through out the rest of the evening, take some Tylenol, take warm bath or shower, could possibly be round ligament pain.  Pt scheduled for appt tomorrow @ 1400 w/ VL, pt advised to arrive @ least 10 min early for appt, pt to call and speak w/ after hours provider w/ any concerns, pt voices agreement to all.

## 2012-07-29 ENCOUNTER — Encounter: Payer: Self-pay | Admitting: Obstetrics and Gynecology

## 2012-07-29 ENCOUNTER — Other Ambulatory Visit: Payer: Self-pay | Admitting: Obstetrics and Gynecology

## 2012-07-29 ENCOUNTER — Telehealth: Payer: Self-pay | Admitting: Obstetrics and Gynecology

## 2012-07-29 ENCOUNTER — Encounter (HOSPITAL_COMMUNITY): Payer: Self-pay | Admitting: Obstetrics and Gynecology

## 2012-07-29 ENCOUNTER — Ambulatory Visit (INDEPENDENT_AMBULATORY_CARE_PROVIDER_SITE_OTHER): Payer: Medicare Other | Admitting: Obstetrics and Gynecology

## 2012-07-29 VITALS — BP 118/70 | Wt 338.0 lb

## 2012-07-29 DIAGNOSIS — G40909 Epilepsy, unspecified, not intractable, without status epilepticus: Secondary | ICD-10-CM

## 2012-07-29 DIAGNOSIS — O0990 Supervision of high risk pregnancy, unspecified, unspecified trimester: Secondary | ICD-10-CM

## 2012-07-29 MED ORDER — CITRANATAL ASSURE 35-1 & 300 MG PO MISC: 1.0000 | Freq: Every day | ORAL | Status: DC

## 2012-07-29 NOTE — Telephone Encounter (Signed)
TC to pt. LM to return call. Regarding [facility] appt.

## 2012-07-29 NOTE — Progress Notes (Signed)
No further seizures since 9/6.  Taking Keppra, has appt with neurologist at [facility] on Monday. Will schedule anatomy US at [facility] with MFM and consult for seizure disorder, morbid obesity. Undecided regarding genetic testing. Plan glucola NV. Rx Citranatal Assure.

## 2012-07-29 NOTE — Progress Notes (Signed)
Pt would like to know if prenatal vitamin can be changed.

## 2012-08-01 ENCOUNTER — Telehealth: Payer: Self-pay | Admitting: Obstetrics and Gynecology

## 2012-08-01 ENCOUNTER — Encounter (HOSPITAL_COMMUNITY): Payer: Self-pay | Admitting: *Deleted

## 2012-08-01 DIAGNOSIS — O99891 Other specified diseases and conditions complicating pregnancy: Secondary | ICD-10-CM | POA: Insufficient documentation

## 2012-08-01 DIAGNOSIS — R569 Unspecified convulsions: Secondary | ICD-10-CM | POA: Insufficient documentation

## 2012-08-01 NOTE — ED Notes (Addendum)
Per EMS: pt is 18 weeks and states that she hasn't been eating or drinking properly and that she doesn't know what a seizure feels like but she thinks she had one. Pt was walking around shopping center during seizure. Pt felt tired, light headed and nauseated during seizure. Pt seizure like activity witnessed by EMS. Pt ambulatory A&Ox4 in triage.

## 2012-08-01 NOTE — Telephone Encounter (Signed)
Message copied by Mason Jim on Mon Aug 01, 2012  1:25 PM ------      Message from: Cornelius Moras      Created: Fri Jul 29, 2012  3:03 PM      Regarding: MFM referral for anatomy US and consult       Needs referral to MFM for anatomy US in next 2 weeks and consult for seizure disorder and morbid obesity.  Currently 17-18 weeks.            Thanks!      VL

## 2012-08-01 NOTE — Telephone Encounter (Signed)
TC to pt. LM to return call.ASAP.  

## 2012-08-01 NOTE — Telephone Encounter (Signed)
TC from pt.  Informed of appt at [facility] 08/03/12 at 2:15. Pt verbalizes comprehension.

## 2012-08-02 ENCOUNTER — Other Ambulatory Visit: Payer: Self-pay | Admitting: Obstetrics and Gynecology

## 2012-08-02 ENCOUNTER — Emergency Department (HOSPITAL_COMMUNITY)
Admission: EM | Admit: 2012-08-02 | Discharge: 2012-08-02 | Disposition: A | Discharge status: Home / Self Care | Payer: Medicare Other | Attending: Emergency Medicine | Admitting: Emergency Medicine

## 2012-08-02 DIAGNOSIS — IMO0002 Reserved for concepts with insufficient information to code with codable children: Secondary | ICD-10-CM

## 2012-08-02 DIAGNOSIS — Z0489 Encounter for examination and observation for other specified reasons: Secondary | ICD-10-CM

## 2012-08-02 NOTE — ED Notes (Signed)
Called for patient x3, no answer

## 2012-08-02 NOTE — ED Notes (Deleted)
Called pt twice to reassess vital signs, no answer.

## 2012-08-03 ENCOUNTER — Ambulatory Visit (HOSPITAL_COMMUNITY)
Admission: RE | Admit: 2012-08-03 | Discharge: 2012-08-03 | Disposition: A | Discharge status: Home / Self Care | Payer: Medicare Other | Source: Ambulatory Visit | Attending: Obstetrics and Gynecology | Admitting: Obstetrics and Gynecology

## 2012-08-03 DIAGNOSIS — O355XX0 Maternal care for (suspected) damage to fetus by drugs, not applicable or unspecified: Secondary | ICD-10-CM | POA: Insufficient documentation

## 2012-08-03 DIAGNOSIS — O09299 Supervision of pregnancy with other poor reproductive or obstetric history, unspecified trimester: Secondary | ICD-10-CM | POA: Insufficient documentation

## 2012-08-03 DIAGNOSIS — IMO0002 Reserved for concepts with insufficient information to code with codable children: Secondary | ICD-10-CM

## 2012-08-03 DIAGNOSIS — Z1389 Encounter for screening for other disorder: Secondary | ICD-10-CM | POA: Insufficient documentation

## 2012-08-03 DIAGNOSIS — G43909 Migraine, unspecified, not intractable, without status migrainosus: Secondary | ICD-10-CM

## 2012-08-03 DIAGNOSIS — O2340 Unspecified infection of urinary tract in pregnancy, unspecified trimester: Secondary | ICD-10-CM

## 2012-08-03 DIAGNOSIS — R7309 Other abnormal glucose: Secondary | ICD-10-CM

## 2012-08-03 DIAGNOSIS — O10019 Pre-existing essential hypertension complicating pregnancy, unspecified trimester: Secondary | ICD-10-CM | POA: Insufficient documentation

## 2012-08-03 DIAGNOSIS — O99210 Obesity complicating pregnancy, unspecified trimester: Secondary | ICD-10-CM | POA: Insufficient documentation

## 2012-08-03 DIAGNOSIS — O358XX0 Maternal care for other (suspected) fetal abnormality and damage, not applicable or unspecified: Secondary | ICD-10-CM | POA: Insufficient documentation

## 2012-08-03 DIAGNOSIS — Z0489 Encounter for examination and observation for other specified reasons: Secondary | ICD-10-CM

## 2012-08-03 DIAGNOSIS — O34219 Maternal care for unspecified type scar from previous cesarean delivery: Secondary | ICD-10-CM | POA: Insufficient documentation

## 2012-08-03 DIAGNOSIS — Z363 Encounter for antenatal screening for malformations: Secondary | ICD-10-CM | POA: Insufficient documentation

## 2012-08-03 DIAGNOSIS — G40909 Epilepsy, unspecified, not intractable, without status epilepticus: Secondary | ICD-10-CM

## 2012-08-03 DIAGNOSIS — F319 Bipolar disorder, unspecified: Secondary | ICD-10-CM

## 2012-08-03 DIAGNOSIS — E669 Obesity, unspecified: Secondary | ICD-10-CM | POA: Insufficient documentation

## 2012-08-03 DIAGNOSIS — Z98891 History of uterine scar from previous surgery: Secondary | ICD-10-CM

## 2012-08-03 NOTE — Consult Note (Signed)
Maternal Fetal Medicine Consultation  Requesting Provider(s): Nigel Bridgeman, CNM  Reason for consultation: Seizure disorder  HPI: [name] [name] is a 31 yo G2P1001 currently at 77 2/7 weeks who is seen today for comprehensive ultrasound and seizure disorder.  She reports that she was first diagnosed with a seizure disorder approximately 1 year ago and is having an ongoing work up with Neurology.  She reports that she has had seizures "almost daily" - was seizure free for approximately 3 months, but most recently reports that they are occuring 2-3 x per week now.  She is currently on Keppra 750 mg daily.  The patient's prenatal care is also complicated by a history of bipolar disoder - not currently followed by Psychiatry and off medications as well a obesity.  She is without complaints today.  OB History: OB History    Grav Para Term Preterm Abortions TAB SAB Ect Mult Living   2 1 1       1     Term C-section for non-reassuring fetal tracing  PMH:  Past Medical History  Diagnosis Date  . Migraines     would be rx'd Percacet for relief  . Seizures 05/2011    unsure what started seizures;Neurology appt on 05/30/12;on Kepra 2000mg  bid;was on Topramax 50mg  tid  . Bipolar affective disorder     no meds currently;has been years since taking meds  . Infection     Yeast inf;not frequent  . Anemia     has taken iron supplements in the past  . Yeast infection   . H/O varicella   . Wears glasses   . Hypertension     Pt states she had pre-eclampsia w last pregnancy     PSH:  Past Surgical History  Procedure Date  . Cesarean section   . Wisdom tooth extraction     All 4 removed  . Tonsillectomy     During high school years   Meds: Keppra, Phenergan, Prenatal vitamins, Iron supplements  Allergies: NKDA  FH: denies family hx of birth defects / hereditary disorder  Soc: denies tobacco, ETOH or illicit drug use  Review of Systems: no vaginal bleeding or cramping/contractions, no LOF, no  nausea/vomiting. All other systems reviewed and are negative.  PE:   Filed Vitals:   08/03/12 1411  BP: 125/86  Pulse: 102    GEN: well-appearing female ABD: gravid, NT  Ultrasound:  Single IUP at 18 2/7 weeks Limited views of the anatomy were obtained, but no gross fetal anomalies were identified No markers associated with aneuploidy were noted Normal amniotic fluid volume   A/P: 1) Seizure disorder- Over 90% of women with seizure disorders have normal pregnancies.  Nevertheless, there are a number of fetal and obstetrical complications associated with seizure disorder.  Women with seizure disorders are at increased risk for fetal malformations regardless of the medication used to control seizures.  Folic acid supplementation is advised, but at the patient's current gestational age there is no benefit regarding ONTD risk.  Although the spine was not well-visualized today, the posterior fossa and cerebellum appeared normal and feel that the risk of spina bifida is remote.  During pregnancy, the volume of distribution changes and adjustments in dose of seizure medication is frequently required.  Plasma concentrations of Keppra have been reported to fall to 40% of baseline concentrations in the 3rd trimester.  Recommend close follow up with Neurology - and dose adjustments as needed.  Some have recommended Vitamin K supplementation during the 3rd trimester  due to concerns of bleeding abnormalities in the newborn; however, large epidemiologic studies have found little benefit.  Keppra does not an enzyme inducing AED and would therefore not recommend vitamin K supplementation.  Limited views of the fetal heart, face and spine were obtained today - recommend follow up ultrasound in 4 weeks to reasses the fetal anatomy.  2) Bipolar disorder- recommend psychiatry referral for follow up and management as needed.  3) Morbid obesity - recommend early 1-hr OGTT due to obesity and borderline  HbA1C.  4) ? Chronic hypertension/ hx of preeclampsia with first pregnancy - the patient denies any history of hypertension and has never previously been on medications.  Based on history of preeclampsia, would recommend baseline preeclampsia labs and 24-hr urine protein and serial growth scans in the 3rd trimester.  Would also recommend antepartum fetal testing (NSTs) beginning at 32 weeks.  5) Aneuploidy screen - normal ultrasound today.  We briefly discussed options for screening - the patient is interested in [place] screen.  Would offer this at her next clinic appointment.   Thank you for the opportunity to be a part of the care of [name] [name]. Please contact our office if we can be of further assistance.   I spent approximately 30 minutes with this patient with over 50% of time spent in face-to-face counseling.  Alpha Gula, MD

## 2012-08-14 ENCOUNTER — Emergency Department (HOSPITAL_COMMUNITY)
Admission: EM | Admit: 2012-08-14 | Discharge: 2012-08-14 | Disposition: A | Discharge status: Home / Self Care | Payer: Medicare Other | Attending: Emergency Medicine | Admitting: Emergency Medicine

## 2012-08-14 ENCOUNTER — Emergency Department (HOSPITAL_COMMUNITY): Payer: Medicare Other

## 2012-08-14 ENCOUNTER — Encounter (HOSPITAL_COMMUNITY): Payer: Self-pay | Admitting: *Deleted

## 2012-08-14 DIAGNOSIS — G40909 Epilepsy, unspecified, not intractable, without status epilepticus: Secondary | ICD-10-CM | POA: Insufficient documentation

## 2012-08-14 DIAGNOSIS — F319 Bipolar disorder, unspecified: Secondary | ICD-10-CM | POA: Insufficient documentation

## 2012-08-14 DIAGNOSIS — I10 Essential (primary) hypertension: Secondary | ICD-10-CM | POA: Insufficient documentation

## 2012-08-14 DIAGNOSIS — R569 Unspecified convulsions: Secondary | ICD-10-CM

## 2012-08-14 DIAGNOSIS — M25519 Pain in unspecified shoulder: Secondary | ICD-10-CM | POA: Insufficient documentation

## 2012-08-14 DIAGNOSIS — Z8679 Personal history of other diseases of the circulatory system: Secondary | ICD-10-CM | POA: Insufficient documentation

## 2012-08-14 LAB — URINALYSIS, ROUTINE W REFLEX MICROSCOPIC
Nitrite: NEGATIVE
Specific Gravity, Urine: 1.027 (ref 1.005–1.030)
Urobilinogen, UA: 0.2 mg/dL (ref 0.0–1.0)
pH: 6.5 (ref 5.0–8.0)

## 2012-08-14 LAB — URINE MICROSCOPIC-ADD ON

## 2012-08-14 MED ORDER — ACETAMINOPHEN 325 MG PO TABS
650.0000 mg | ORAL_TABLET | Freq: Once | ORAL | Status: DC
  Filled 2012-08-14: qty 2

## 2012-08-14 NOTE — ED Notes (Signed)
Per pts request called her emergency contact Cloretta Ned [phone]) and informed him she was in the ED.  Also gave another contact number (Ms. Dennie Bible [phone]), which was not called.

## 2012-08-14 NOTE — ED Notes (Signed)
Spoke with Dr. Stefano Gaul, OB. He stated that since the baby is 20 weeks, there is no need for continuous fetal monitoring. Just document that fetal heart tones are present and in a desirable range.

## 2012-08-14 NOTE — ED Provider Notes (Addendum)
History     CSN: [id number]  Arrival date & time 08/14/12  2054   First MD Initiated Contact with Patient 08/14/12 2133      Chief Complaint  Patient presents with  . Seizures  . Shoulder Pain    (Consider location/radiation/quality/duration/timing/severity/associated sxs/prior treatment) The history is provided by the patient.   Patient here after having an a witnessed seizure at home. She has a history of multiple seizures in the past. She is currently 5 months pregnant has no history of eclampsia. Is compliant with his seizure medications but states that her neurologist has had difficulty controlling her seizures. Denies any headache currently. No vaginal bleeding or discharge. No treatment used prior to arrival and nothing makes her symptoms better or worse Past Medical History  Diagnosis Date  . Migraines     would be rx'd Percacet for relief  . Seizures 05/2011    unsure what started seizures;Neurology appt on 05/30/12;on Kepra 2000mg  bid;was on Topramax 50mg  tid  . Bipolar affective disorder     no meds currently;has been years since taking meds  . Infection     Yeast inf;not frequent  . Anemia     has taken iron supplements in the past  . Yeast infection   . H/O varicella   . Wears glasses   . Hypertension     Pt states she had pre-eclampsia w last pregnancy     Past Surgical History  Procedure Date  . Cesarean section   . Wisdom tooth extraction     All 4 removed  . Tonsillectomy     During high school years    Family History  Problem Relation Age of Onset  . Other Neg Hx   . Cancer Mother     Ovarian  . Diabetes Mother   . Hypertension Father   . Cirrhosis Father   . Hypertension Brother   . Asthma Son     History  Substance Use Topics  . Smoking status: Never Smoker   . Smokeless tobacco: Never Used  . Alcohol Use: No    OB History    Grav Para Term Preterm Abortions TAB SAB Ect Mult Living   2 1 1       1       Review of Systems  All  other systems reviewed and are negative.    Allergies  Review of patient's allergies indicates no known allergies.  Home Medications   Current Outpatient Rx  Name Route Sig Dispense Refill  . LEVETIRACETAM ER 750 MG PO TB24 Oral Take 750 mg by mouth 4 (four) times daily.     Marland Kitchen CITRANATAL ASSURE 35-1 & 300 MG PO MISC Oral Take 1 tablet by mouth daily.    Marland Kitchen PROMETHAZINE HCL 12.5 MG PO TABS Oral Take 12.5 mg by mouth every 4 (four) hours as needed. nausea      BP 130/75  Pulse 95  Temp 98.6 F (37 C) (Oral)  Resp 20  Ht 5\' 1"  (1.549 m)  Wt 348 lb (157.852 kg)  BMI 65.75 kg/m2  SpO2 100%  LMP 03/28/2012  Physical Exam  Nursing note and vitals reviewed. Constitutional: She is oriented to person, place, and time. She appears well-developed and well-nourished.  Non-toxic appearance. No distress.  HENT:  Head: Normocephalic and atraumatic.       Tongue abrasions to the right lateral side.  Eyes: Conjunctivae normal, EOM and lids are normal. Pupils are equal, round, and reactive to light.  Neck:  Normal range of motion. Neck supple. No tracheal deviation present. No mass present.  Cardiovascular: Normal rate, regular rhythm and normal heart sounds.  Exam reveals no gallop.   No murmur heard. Pulmonary/Chest: Effort normal and breath sounds normal. No stridor. No respiratory distress. She has no decreased breath sounds. She has no wheezes. She has no rhonchi. She has no rales.  Abdominal: Soft. Normal appearance and bowel sounds are normal. She exhibits no distension. There is no tenderness. There is no rebound and no CVA tenderness.  Musculoskeletal: Normal range of motion. She exhibits no edema and no tenderness.       Left shoulder: She exhibits pain and spasm. She exhibits no deformity.  Neurological: She is alert and oriented to person, place, and time. She has normal strength. No cranial nerve deficit or sensory deficit. GCS eye subscore is 4. GCS verbal subscore is 5. GCS motor  subscore is 6.  Skin: Skin is warm and dry. No abrasion and no rash noted.  Psychiatric: She has a normal mood and affect. Her speech is normal and behavior is normal.    ED Course  Procedures (including critical care time)   Labs Reviewed  URINALYSIS, ROUTINE W REFLEX MICROSCOPIC  COMPREHENSIVE METABOLIC PANEL   No results found.   No diagnosis found.    MDM  Pt without evidence of pre-ecclampsia--nl blood pressure and urine--pt with known h/o seizure d/o--  11:11 PM Pt will see her neurologist next week        Toy Baker, MD 08/14/12 2313  Toy Baker, MD 08/28/12 1008

## 2012-08-14 NOTE — ED Notes (Signed)
Pt left before signing discharge page.

## 2012-08-14 NOTE — ED Notes (Signed)
OB Rapid Response called 

## 2012-08-14 NOTE — Progress Notes (Signed)
Notified Nigel Bridgeman CNM concerning gestational weeks of patient and plan of care for patient.  Will notify Dr. Stefano Gaul for further evaluation for plan of care of pt. And gestation

## 2012-08-14 NOTE — ED Notes (Addendum)
Pt states that she has hx of frequent seizures. She says she is on Keppra but still has seizures often. Pt says she was getting out of the shower at 1930 and she fell onto the bathroom floor hitting her head and her left shoulder. Pt is 5 mo pregnant.

## 2012-08-14 NOTE — Progress Notes (Addendum)
Received call from Ardine Eng at [facility] ED concerning pt.  Pt reports having a seizure and fell during seizure.

## 2012-08-14 NOTE — ED Notes (Signed)
Pt to triage via [facility]; pt reports seizure at home after a shower; unwitnessed seizure; pt does not know how long it lasted; pt c/o left shoulder pain after seizure; pt also reports that she is 5 months pregnant; pt also reports seizure x 2 on Friday and was seen in [place], [place]. Pt reports that they have changed her seizure medication in the last 2 months.

## 2012-08-14 NOTE — Progress Notes (Signed)
Notified Dr. Stefano Gaul and report given on patient.  Dr. Stefano Gaul will notify [facility] for plan of care with patient and no need for fetal monitoring at this time from Rapid response at this time.

## 2012-08-16 LAB — URINE CULTURE

## 2012-08-17 ENCOUNTER — Emergency Department (INDEPENDENT_AMBULATORY_CARE_PROVIDER_SITE_OTHER)
Admission: EM | Admit: 2012-08-17 | Discharge: 2012-08-17 | Disposition: A | Discharge status: Home / Self Care | Payer: Medicare Other | Source: Home / Self Care | Attending: Family Medicine | Admitting: Family Medicine

## 2012-08-17 ENCOUNTER — Encounter (HOSPITAL_COMMUNITY): Payer: Self-pay

## 2012-08-17 DIAGNOSIS — S46912A Strain of unspecified muscle, fascia and tendon at shoulder and upper arm level, left arm, initial encounter: Secondary | ICD-10-CM

## 2012-08-17 DIAGNOSIS — IMO0002 Reserved for concepts with insufficient information to code with codable children: Secondary | ICD-10-CM

## 2012-08-17 MED ORDER — CYCLOBENZAPRINE HCL 5 MG PO TABS
5.0000 mg | ORAL_TABLET | Freq: Two times a day (BID) | ORAL | Status: DC | PRN
Start: 2012-08-17 — End: 2012-09-25
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
## 2012-08-18 NOTE — ED Provider Notes (Signed)
Medical screening examination/treatment/procedure(s) were performed by resident physician or non-physician practitioner and as supervising physician I was immediately available for consultation/collaboration.   KINDL,JAMES DOUGLAS MD.    James D Kindl, MD 08/18/12 1858 

## 2012-08-21 ENCOUNTER — Inpatient Hospital Stay (HOSPITAL_COMMUNITY)
Admission: AD | Admit: 2012-08-21 | Discharge: 2012-08-21 | Disposition: A | Discharge status: Home / Self Care | Payer: Medicare Other | Source: Ambulatory Visit | Attending: Obstetrics and Gynecology | Admitting: Obstetrics and Gynecology

## 2012-08-21 ENCOUNTER — Encounter (HOSPITAL_COMMUNITY): Payer: Self-pay

## 2012-08-21 DIAGNOSIS — K5289 Other specified noninfective gastroenteritis and colitis: Secondary | ICD-10-CM

## 2012-08-21 DIAGNOSIS — R109 Unspecified abdominal pain: Secondary | ICD-10-CM | POA: Insufficient documentation

## 2012-08-21 DIAGNOSIS — O99891 Other specified diseases and conditions complicating pregnancy: Secondary | ICD-10-CM | POA: Insufficient documentation

## 2012-08-21 DIAGNOSIS — E669 Obesity, unspecified: Secondary | ICD-10-CM

## 2012-08-21 DIAGNOSIS — O99210 Obesity complicating pregnancy, unspecified trimester: Secondary | ICD-10-CM

## 2012-08-21 DIAGNOSIS — G40909 Epilepsy, unspecified, not intractable, without status epilepticus: Secondary | ICD-10-CM

## 2012-08-21 DIAGNOSIS — A088 Other specified intestinal infections: Secondary | ICD-10-CM | POA: Insufficient documentation

## 2012-08-21 DIAGNOSIS — Z6841 Body Mass Index (BMI) 40.0 and over, adult: Secondary | ICD-10-CM | POA: Insufficient documentation

## 2012-08-21 DIAGNOSIS — O99350 Diseases of the nervous system complicating pregnancy, unspecified trimester: Secondary | ICD-10-CM

## 2012-08-21 DIAGNOSIS — K529 Noninfective gastroenteritis and colitis, unspecified: Secondary | ICD-10-CM

## 2012-08-21 LAB — CBC WITH DIFFERENTIAL/PLATELET
Basophils Absolute: 0 10*3/uL (ref 0.0–0.1)
Basophils Relative: 0 % (ref 0–1)
Hemoglobin: 10.9 g/dL — ABNORMAL LOW (ref 12.0–15.0)
Lymphocytes Relative: 31 % (ref 12–46)
MCHC: 31.4 g/dL (ref 30.0–36.0)
Monocytes Relative: 6 % (ref 3–12)
Neutro Abs: 7.5 10*3/uL (ref 1.7–7.7)
Neutrophils Relative %: 62 % (ref 43–77)
WBC: 12.1 10*3/uL — ABNORMAL HIGH (ref 4.0–10.5)

## 2012-08-21 LAB — URINALYSIS, ROUTINE W REFLEX MICROSCOPIC
Ketones, ur: NEGATIVE mg/dL
Leukocytes, UA: NEGATIVE
Nitrite: NEGATIVE
Specific Gravity, Urine: 1.02 (ref 1.005–1.030)
pH: 6.5 (ref 5.0–8.0)

## 2012-08-21 MED ORDER — ONDANSETRON 8 MG PO TBDP: 8.0000 mg | ORAL_TABLET | Freq: Three times a day (TID) | ORAL | Status: DC | PRN

## 2012-08-21 MED ORDER — GI COCKTAIL ~~LOC~~
30.0000 mL | Freq: Once | ORAL | Status: AC
  Administered 2012-08-21: 30 mL via ORAL
  Filled 2012-08-21: qty 30

## 2012-08-21 NOTE — MAU Note (Signed)
Pt G2 P1 at 20.6wks having lower abd and back pain x 2 wks, frequency with urination today, headache x 3 days and SOB tonight.
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
## 2012-08-22 LAB — COMPREHENSIVE METABOLIC PANEL
AST: 11 U/L (ref 0–37)
Albumin: 2.7 g/dL — ABNORMAL LOW (ref 3.5–5.2)
Alkaline Phosphatase: 52 U/L (ref 39–117)
BUN: 8 mg/dL (ref 6–23)
CO2: 24 mEq/L (ref 19–32)
Chloride: 100 mEq/L (ref 96–112)
Potassium: 3.7 mEq/L (ref 3.5–5.1)
Total Bilirubin: 0.1 mg/dL — ABNORMAL LOW (ref 0.3–1.2)

## 2012-08-26 ENCOUNTER — Ambulatory Visit (INDEPENDENT_AMBULATORY_CARE_PROVIDER_SITE_OTHER): Payer: Medicare Other | Admitting: Obstetrics and Gynecology

## 2012-08-26 ENCOUNTER — Other Ambulatory Visit: Payer: Medicare Other

## 2012-08-26 VITALS — BP 110/80 | Wt 346.0 lb

## 2012-08-26 DIAGNOSIS — O10019 Pre-existing essential hypertension complicating pregnancy, unspecified trimester: Secondary | ICD-10-CM

## 2012-08-26 DIAGNOSIS — I10 Essential (primary) hypertension: Secondary | ICD-10-CM

## 2012-08-26 DIAGNOSIS — Z331 Pregnant state, incidental: Secondary | ICD-10-CM

## 2012-08-26 DIAGNOSIS — O10919 Unspecified pre-existing hypertension complicating pregnancy, unspecified trimester: Secondary | ICD-10-CM

## 2012-08-26 DIAGNOSIS — O1400 Mild to moderate pre-eclampsia, unspecified trimester: Secondary | ICD-10-CM

## 2012-08-26 DIAGNOSIS — IMO0002 Reserved for concepts with insufficient information to code with codable children: Secondary | ICD-10-CM

## 2012-08-26 DIAGNOSIS — Z3490 Encounter for supervision of normal pregnancy, unspecified, unspecified trimester: Secondary | ICD-10-CM

## 2012-08-26 NOTE — Progress Notes (Addendum)
[redacted]w[redacted]d Pt wants repeat c/s Pt will take extra folic acid Will do quad Screen today As per Dr Claudean Severance of MFM's note secondary to [facility] (although pt denies and no h/o meds) he rec baseline 24hr urine and labs - she did have preeclampsia in last pregnancy.  He also recs antepartum testing at 32wks with 2x/wk NSTs and will also do growth u/s q4wks starting at about 26wks Refer to Neurologist at [facility] per pt request and we need to get consult note from neurologist at [facility] that pt saw recently Needs to sign VBAC consent for repeat c/s at [facility] She had nl anatomy scan at [facility]  Dr. Claudean Severance also rec referral to psych secondary to h/o bipolar disorder.  Will make referral.

## 2012-08-27 LAB — COMPREHENSIVE METABOLIC PANEL
AST: 9 U/L (ref 0–37)
Albumin: 3.1 g/dL — ABNORMAL LOW (ref 3.5–5.2)
Alkaline Phosphatase: 46 U/L (ref 39–117)
BUN: 6 mg/dL (ref 6–23)
Potassium: 4.2 mEq/L (ref 3.5–5.3)
Sodium: 137 mEq/L (ref 135–145)

## 2012-08-27 LAB — CBC
HCT: 32.1 % — ABNORMAL LOW (ref 36.0–46.0)
Hemoglobin: 10.3 g/dL — ABNORMAL LOW (ref 12.0–15.0)
MCH: 21.8 pg — ABNORMAL LOW (ref 26.0–34.0)
MCHC: 32.1 g/dL (ref 30.0–36.0)

## 2012-08-27 LAB — LACTATE DEHYDROGENASE: LDH: 108 U/L (ref 94–250)

## 2012-08-29 ENCOUNTER — Telehealth: Payer: Self-pay

## 2012-08-29 LAB — AFP, QUAD SCREEN
AFP: 53 IU/mL
Age Alone: 1:712 {titer}
Curr Gest Age: 21.3 wks.days
Down Syndrome Scr Risk Est: 1:34300 {titer}
HCG, Total: 10902 m[IU]/mL
MoM for INH: 1.45
Trisomy 18 (Edward) Syndrome Interp.: 1:106000 {titer}

## 2012-08-29 NOTE — Telephone Encounter (Signed)
LM for pt to cb with the name of Neurologist who pt recently saw at [facility]. Melody Comas A

## 2012-08-30 ENCOUNTER — Other Ambulatory Visit: Payer: Self-pay | Admitting: Obstetrics and Gynecology

## 2012-08-30 ENCOUNTER — Telehealth: Payer: Self-pay | Admitting: Obstetrics and Gynecology

## 2012-08-30 DIAGNOSIS — G40909 Epilepsy, unspecified, not intractable, without status epilepticus: Secondary | ICD-10-CM

## 2012-08-30 NOTE — Telephone Encounter (Signed)
Message copied by Mason Jim on Tue Aug 30, 2012  4:41 PM ------      Message from: Osborn Coho      Created: Sat Aug 27, 2012  3:40 PM       Please refer to psychfor f/u and mgmt as needed for bipolar disorder as per MFM recommendations.  Thank you

## 2012-08-30 NOTE — Telephone Encounter (Signed)
TC to pt. LM to return call. Pt is scheduled at [facility] 09/08/12 at 4:00.

## 2012-08-31 ENCOUNTER — Ambulatory Visit (HOSPITAL_COMMUNITY): Payer: Medicare Other

## 2012-08-31 ENCOUNTER — Telehealth: Payer: Self-pay | Admitting: Obstetrics and Gynecology

## 2012-08-31 NOTE — Telephone Encounter (Signed)
Message copied by Mason Jim on Wed Aug 31, 2012 11:59 AM ------      Message from: Osborn Coho      Created: Sat Aug 27, 2012  3:40 PM       Please refer to psychfor f/u and mgmt as needed for bipolar disorder as per MFM recommendations.  Thank you

## 2012-08-31 NOTE — Telephone Encounter (Signed)
TC to pt. Informed has appt at [facility] with [facility] 09/08/12 at 4:00. Phone # given. Pt states saw Dr Craige Cotta at [facility].

## 2012-09-01 ENCOUNTER — Telehealth: Payer: Self-pay

## 2012-09-01 NOTE — Telephone Encounter (Signed)
Called pt to let her know of Neurology referral to [facility]. Appt scheduled for 10/18/2012 @ 1 pm w/ Dr. Laveda Norman. Records faxed. This was the first available appt.  Address is [address] [place], [place]. [address].

## 2012-09-02 ENCOUNTER — Ambulatory Visit (HOSPITAL_COMMUNITY): Payer: Medicare Other | Attending: Obstetrics and Gynecology

## 2012-09-07 ENCOUNTER — Telehealth: Payer: Self-pay | Admitting: Obstetrics and Gynecology

## 2012-09-07 NOTE — Telephone Encounter (Signed)
Pt called, is 23w 2d, c/o cramping, heartburn and headaches.  Pt states she is hardly drinking any water at all, no meds taken for heartburn, and has tried Tylenol which pt states is no relief for her headaches.  Pt advised that she really needs to get in a minimum of 64 oz of water/day, and to try TUMS for the heartburn.  While speaking to pt phone silenced.  Called pt back, lm on vm to call back to complete the conversation.

## 2012-09-12 ENCOUNTER — Telehealth: Payer: Self-pay

## 2012-09-12 NOTE — Telephone Encounter (Signed)
TC to pt to remind her about collecting her 24 hr urine. She had completely forgotten about it. She sees AR on 09/21/2012, so I rec. That she start collecting in the am and then return it Wed. Am so that we have the results when pt sees AR on the 4th of Dec. Pt is agreeable to do it before her next appt. Melody Comas A

## 2012-09-19 ENCOUNTER — Telehealth: Payer: Self-pay

## 2012-09-19 NOTE — Telephone Encounter (Signed)
LM again for pt to collect her 24 hr urine prior to her visit on the 4th with AR. Melody Comas A

## 2012-09-21 ENCOUNTER — Ambulatory Visit (INDEPENDENT_AMBULATORY_CARE_PROVIDER_SITE_OTHER): Payer: Medicare Other | Admitting: Obstetrics and Gynecology

## 2012-09-21 VITALS — BP 112/70 | Wt 353.0 lb

## 2012-09-21 DIAGNOSIS — O10919 Unspecified pre-existing hypertension complicating pregnancy, unspecified trimester: Secondary | ICD-10-CM

## 2012-09-21 DIAGNOSIS — O10019 Pre-existing essential hypertension complicating pregnancy, unspecified trimester: Secondary | ICD-10-CM

## 2012-09-21 DIAGNOSIS — I10 Essential (primary) hypertension: Secondary | ICD-10-CM

## 2012-09-21 NOTE — Progress Notes (Signed)
[redacted]w[redacted]d VBAC consent form signed today for repeat c/s Quad screen neg [facility] appt is 12/31 pt is going to call to try to get sooner appt RTO 2wks for u/s for EFW secondary to obesity and ROB Rec antepartum testing starting at 32wks Pt reports seizures last week while she was out of town Glucola at [facility] and check vit D at that time as well.  Pt feeling extra tired she says. 24hr urine handed in for baseline

## 2012-09-21 NOTE — Addendum Note (Signed)
Addended by: Larwance Rote on: 09/21/2012 05:04 PM   Modules accepted: Orders

## 2012-09-22 ENCOUNTER — Telehealth: Payer: Self-pay

## 2012-09-25 ENCOUNTER — Emergency Department (HOSPITAL_COMMUNITY): Payer: No Typology Code available for payment source

## 2012-09-25 ENCOUNTER — Inpatient Hospital Stay (HOSPITAL_COMMUNITY)
Admission: EM | Admit: 2012-09-25 | Discharge: 2012-09-26 | Disposition: A | Discharge status: Home / Self Care | Payer: No Typology Code available for payment source | Attending: Obstetrics and Gynecology | Admitting: Obstetrics and Gynecology

## 2012-09-25 ENCOUNTER — Encounter (HOSPITAL_COMMUNITY): Payer: Self-pay | Admitting: *Deleted

## 2012-09-25 DIAGNOSIS — Y92410 Unspecified street and highway as the place of occurrence of the external cause: Secondary | ICD-10-CM | POA: Insufficient documentation

## 2012-09-25 DIAGNOSIS — F319 Bipolar disorder, unspecified: Secondary | ICD-10-CM

## 2012-09-25 DIAGNOSIS — R52 Pain, unspecified: Secondary | ICD-10-CM | POA: Insufficient documentation

## 2012-09-25 DIAGNOSIS — R079 Chest pain, unspecified: Secondary | ICD-10-CM | POA: Insufficient documentation

## 2012-09-25 DIAGNOSIS — R1012 Left upper quadrant pain: Secondary | ICD-10-CM | POA: Insufficient documentation

## 2012-09-25 DIAGNOSIS — O10019 Pre-existing essential hypertension complicating pregnancy, unspecified trimester: Secondary | ICD-10-CM | POA: Insufficient documentation

## 2012-09-25 DIAGNOSIS — S20219A Contusion of unspecified front wall of thorax, initial encounter: Secondary | ICD-10-CM | POA: Insufficient documentation

## 2012-09-25 DIAGNOSIS — O99891 Other specified diseases and conditions complicating pregnancy: Secondary | ICD-10-CM | POA: Insufficient documentation

## 2012-09-25 DIAGNOSIS — O2340 Unspecified infection of urinary tract in pregnancy, unspecified trimester: Secondary | ICD-10-CM

## 2012-09-25 DIAGNOSIS — E669 Obesity, unspecified: Secondary | ICD-10-CM | POA: Insufficient documentation

## 2012-09-25 DIAGNOSIS — Z98891 History of uterine scar from previous surgery: Secondary | ICD-10-CM

## 2012-09-25 DIAGNOSIS — G40909 Epilepsy, unspecified, not intractable, without status epilepticus: Secondary | ICD-10-CM

## 2012-09-25 DIAGNOSIS — R7309 Other abnormal glucose: Secondary | ICD-10-CM

## 2012-09-25 DIAGNOSIS — G43909 Migraine, unspecified, not intractable, without status migrainosus: Secondary | ICD-10-CM

## 2012-09-25 HISTORY — DX: Gestational (pregnancy-induced) hypertension without significant proteinuria, unspecified trimester: O13.9

## 2012-09-25 LAB — CBC WITH DIFFERENTIAL/PLATELET
Basophils Absolute: 0 10*3/uL (ref 0.0–0.1)
HCT: 34.6 % — ABNORMAL LOW (ref 36.0–46.0)
Lymphocytes Relative: 19 % (ref 12–46)
Lymphs Abs: 2.4 10*3/uL (ref 0.7–4.0)
Monocytes Absolute: 0.5 10*3/uL (ref 0.1–1.0)
Neutro Abs: 10.1 10*3/uL — ABNORMAL HIGH (ref 1.7–7.7)
Platelets: 400 10*3/uL (ref 150–400)
RBC: 5.01 MIL/uL (ref 3.87–5.11)
RDW: 18.1 % — ABNORMAL HIGH (ref 11.5–15.5)
WBC: 13.1 10*3/uL — ABNORMAL HIGH (ref 4.0–10.5)

## 2012-09-25 LAB — COMPREHENSIVE METABOLIC PANEL
ALT: 12 U/L (ref 0–35)
AST: 23 U/L (ref 0–37)
CO2: 20 mEq/L (ref 19–32)
Chloride: 101 mEq/L (ref 96–112)
GFR calc non Af Amer: >90 mL/min (ref >90)
Sodium: 134 mEq/L — ABNORMAL LOW (ref 135–145)
Total Bilirubin: 0.3 mg/dL (ref 0.3–1.2)

## 2012-09-25 LAB — URINALYSIS, ROUTINE W REFLEX MICROSCOPIC
Bilirubin Urine: NEGATIVE
Glucose, UA: NEGATIVE mg/dL
Hgb urine dipstick: NEGATIVE
Ketones, ur: 40 mg/dL — AB
Protein, ur: 30 mg/dL — AB

## 2012-09-25 LAB — POCT I-STAT, CHEM 8
Creatinine, Ser: 0.5 mg/dL (ref 0.50–1.10)
Glucose, Bld: 108 mg/dL — ABNORMAL HIGH (ref 70–99)
Hemoglobin: 12.2 g/dL (ref 12.0–15.0)
Potassium: 3.9 mEq/L (ref 3.5–5.1)

## 2012-09-25 LAB — URINE MICROSCOPIC-ADD ON

## 2012-09-25 MED ORDER — ONDANSETRON HCL 4 MG/2ML IJ SOLN
4.0000 mg | Freq: Once | INTRAMUSCULAR | Status: AC
  Administered 2012-09-25: 4 mg via INTRAVENOUS

## 2012-09-25 MED ORDER — MORPHINE SULFATE 4 MG/ML IJ SOLN
4.0000 mg | Freq: Once | INTRAMUSCULAR | Status: AC
  Administered 2012-09-25: 4 mg via INTRAVENOUS
  Filled 2012-09-25: qty 1

## 2012-09-25 MED ORDER — ONDANSETRON HCL 4 MG/2ML IJ SOLN
INTRAMUSCULAR | Status: AC
  Filled 2012-09-25: qty 2
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
## 2012-09-25 NOTE — Progress Notes (Signed)
09/25/12 1900  Clinical Encounter Type  Visited With Patient  Visit Type Trauma  Chaplain met patient and offered help to contact her family. She was not able to provide names to call. Follow up with patient after doctors complete exams/tests. Veryl Speak
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
## 2012-09-25 NOTE — Progress Notes (Signed)
RN attempted to find FHR w/ external monitor & doppler from 2106-2122 w/ no success.  Pt states that she was told in [place] that baby had good HR but was not moving very much.  ED MD notified of unsuccessful FHR tracing and is to call for transfer to [facility] as soon as he is out of trauma room.
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
## 2012-09-25 NOTE — ED Notes (Signed)
Portable x-ray in room 

## 2012-09-25 NOTE — Progress Notes (Signed)
U/S done @ bedside by MD.  Previous RN unable to get FHR w/ monitor.

## 2012-09-26 ENCOUNTER — Encounter (HOSPITAL_COMMUNITY): Payer: Self-pay

## 2012-09-26 MED ORDER — CYCLOBENZAPRINE HCL 10 MG PO TABS
10.0000 mg | ORAL_TABLET | Freq: Once | ORAL | Status: AC
  Administered 2012-09-26: 10 mg via ORAL
  Filled 2012-09-26: qty 1

## 2012-09-26 MED ORDER — LEVETIRACETAM ER 500 MG PO TB24
500.0000 mg | ORAL_TABLET | Freq: Every day | ORAL | Status: DC
Start: 2012-09-26 — End: 2012-09-26
  Administered 2012-09-26: 500 mg via ORAL
  Filled 2012-09-26: qty 1

## 2012-09-26 MED ORDER — ACETAMINOPHEN 500 MG PO TABS
1000.0000 mg | ORAL_TABLET | Freq: Four times a day (QID) | ORAL | Status: DC | PRN
  Administered 2012-09-26: 1000 mg via ORAL
  Filled 2012-09-26: qty 2

## 2012-09-26 MED ORDER — LEVETIRACETAM 750 MG PO TABS: 750.0000 mg | ORAL_TABLET | Freq: Four times a day (QID) | ORAL | Status: DC

## 2012-09-26 MED ORDER — ONDANSETRON HCL 4 MG/2ML IJ SOLN
4.0000 mg | Freq: Once | INTRAMUSCULAR | Status: AC
  Administered 2012-09-26: 4 mg via INTRAVENOUS
  Filled 2012-09-26: qty 2

## 2012-09-26 MED ORDER — HYDROCODONE-ACETAMINOPHEN 5-325 MG PO TABS: 1.0000 | ORAL_TABLET | Freq: Four times a day (QID) | ORAL | Status: DC | PRN

## 2012-09-26 MED ORDER — DEXTROSE IN LACTATED RINGERS 5 % IV SOLN
INTRAVENOUS | Status: DC
  Administered 2012-09-26: 03:00:00 via INTRAVENOUS

## 2012-09-26 MED ORDER — MORPHINE SULFATE 4 MG/ML IJ SOLN
4.0000 mg | Freq: Once | INTRAMUSCULAR | Status: AC
  Administered 2012-09-26: 4 mg via INTRAVENOUS
  Filled 2012-09-26: qty 1

## 2012-09-26 MED ORDER — IBUPROFEN 800 MG PO TABS
800.0000 mg | ORAL_TABLET | Freq: Once | ORAL | Status: AC
  Administered 2012-09-26: 800 mg via ORAL
  Filled 2012-09-26: qty 1

## 2012-09-26 MED ORDER — ACETAMINOPHEN 325 MG PO TABS
650.0000 mg | ORAL_TABLET | Freq: Once | ORAL | Status: AC
  Administered 2012-09-26: 650 mg via ORAL
  Filled 2012-09-26: qty 2
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
## 2012-09-26 NOTE — ED Provider Notes (Signed)
I have personally seen and examined the patient.  I have discussed the plan of care with the resident.  I have reviewed the documentation on PMH/FH/Soc. History.  I have reviewed the documentation of the resident and agree.  Doug Sou, MD 09/26/12 1130
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
## 2012-09-26 NOTE — Progress Notes (Signed)
Pt has been resting off and on.  Hasn't asked for additional pain medicine since received Motrin and Flexeril.  She has been assisted up to BR by RN and ambulated well and was able to void spontaneously.  FHR WNL around 145 w/ moderate variability and some 10x10 accels.  No apparent ctxs.  Will continue current POC. Rexene Edison, CNM 09/26/12 [phone]

## 2012-10-05 ENCOUNTER — Ambulatory Visit (INDEPENDENT_AMBULATORY_CARE_PROVIDER_SITE_OTHER): Payer: Medicare Other | Admitting: Obstetrics and Gynecology

## 2012-10-05 ENCOUNTER — Ambulatory Visit (INDEPENDENT_AMBULATORY_CARE_PROVIDER_SITE_OTHER): Payer: Medicare Other

## 2012-10-05 ENCOUNTER — Encounter: Payer: Self-pay | Admitting: Obstetrics and Gynecology

## 2012-10-05 VITALS — BP 122/80 | Wt 353.0 lb

## 2012-10-05 DIAGNOSIS — O2390 Unspecified genitourinary tract infection in pregnancy, unspecified trimester: Secondary | ICD-10-CM

## 2012-10-05 DIAGNOSIS — G40909 Epilepsy, unspecified, not intractable, without status epilepticus: Secondary | ICD-10-CM

## 2012-10-05 DIAGNOSIS — Z9889 Other specified postprocedural states: Secondary | ICD-10-CM

## 2012-10-05 DIAGNOSIS — Z98891 History of uterine scar from previous surgery: Secondary | ICD-10-CM

## 2012-10-05 DIAGNOSIS — E569 Vitamin deficiency, unspecified: Secondary | ICD-10-CM

## 2012-10-05 DIAGNOSIS — E559 Vitamin D deficiency, unspecified: Secondary | ICD-10-CM

## 2012-10-05 DIAGNOSIS — R7309 Other abnormal glucose: Secondary | ICD-10-CM

## 2012-10-05 DIAGNOSIS — R7989 Other specified abnormal findings of blood chemistry: Secondary | ICD-10-CM

## 2012-10-05 DIAGNOSIS — Z130 Encounter for screening for diseases of the blood and blood-forming organs and certain disorders involving the immune mechanism: Secondary | ICD-10-CM

## 2012-10-05 DIAGNOSIS — G43909 Migraine, unspecified, not intractable, without status migrainosus: Secondary | ICD-10-CM

## 2012-10-05 DIAGNOSIS — R5383 Other fatigue: Secondary | ICD-10-CM

## 2012-10-05 DIAGNOSIS — Z1321 Encounter for screening for nutritional disorder: Secondary | ICD-10-CM

## 2012-10-05 DIAGNOSIS — Z6841 Body Mass Index (BMI) 40.0 and over, adult: Secondary | ICD-10-CM

## 2012-10-05 DIAGNOSIS — Z3480 Encounter for supervision of other normal pregnancy, unspecified trimester: Secondary | ICD-10-CM

## 2012-10-05 DIAGNOSIS — O10919 Unspecified pre-existing hypertension complicating pregnancy, unspecified trimester: Secondary | ICD-10-CM

## 2012-10-05 DIAGNOSIS — N390 Urinary tract infection, site not specified: Secondary | ICD-10-CM

## 2012-10-05 DIAGNOSIS — F319 Bipolar disorder, unspecified: Secondary | ICD-10-CM

## 2012-10-05 DIAGNOSIS — R5381 Other malaise: Secondary | ICD-10-CM

## 2012-10-05 DIAGNOSIS — O10019 Pre-existing essential hypertension complicating pregnancy, unspecified trimester: Secondary | ICD-10-CM

## 2012-10-05 DIAGNOSIS — O2340 Unspecified infection of urinary tract in pregnancy, unspecified trimester: Secondary | ICD-10-CM

## 2012-10-05 LAB — CBC
Hemoglobin: 9.8 g/dL — ABNORMAL LOW (ref 12.0–15.0)
MCH: 21.5 pg — ABNORMAL LOW (ref 26.0–34.0)
RBC: 4.56 MIL/uL (ref 3.87–5.11)

## 2012-10-05 NOTE — Progress Notes (Signed)
[redacted]w[redacted]d No complaints Consent for repeat c/s signed today 24hr urine neg (65mg  prot) U/S EFW 2-6 42.3%, nl fluid AFI 14.3, vtx, ant placenta Repeat u/s for EFW in 4wks with BPP and pt will start antepartum testing at that time as well Glucola today RTO in 2wks Appt with [facility] 10/18/12

## 2012-10-06 LAB — RPR

## 2012-10-06 LAB — GLUCOSE TOLERANCE, 1 HOUR (50G) W/O FASTING: Glucose, 1 Hour GTT: 202 mg/dL — ABNORMAL HIGH (ref 70–140)

## 2012-10-07 LAB — US OB FOLLOW UP

## 2012-10-17 ENCOUNTER — Telehealth: Payer: Self-pay
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
## 2012-10-20 ENCOUNTER — Telehealth: Payer: Self-pay

## 2012-10-25 ENCOUNTER — Ambulatory Visit (INDEPENDENT_AMBULATORY_CARE_PROVIDER_SITE_OTHER): Payer: Medicare Other | Admitting: Obstetrics and Gynecology

## 2012-10-25 VITALS — BP 124/70 | Wt 353.0 lb

## 2012-10-25 DIAGNOSIS — G40909 Epilepsy, unspecified, not intractable, without status epilepticus: Secondary | ICD-10-CM

## 2012-10-25 DIAGNOSIS — O10919 Unspecified pre-existing hypertension complicating pregnancy, unspecified trimester: Secondary | ICD-10-CM

## 2012-10-25 DIAGNOSIS — Z91199 Patient's noncompliance with other medical treatment and regimen due to unspecified reason: Secondary | ICD-10-CM

## 2012-10-25 DIAGNOSIS — O99340 Other mental disorders complicating pregnancy, unspecified trimester: Secondary | ICD-10-CM

## 2012-10-25 DIAGNOSIS — O99350 Diseases of the nervous system complicating pregnancy, unspecified trimester: Secondary | ICD-10-CM

## 2012-10-25 DIAGNOSIS — Z9119 Patient's noncompliance with other medical treatment and regimen: Secondary | ICD-10-CM

## 2012-10-25 DIAGNOSIS — O10019 Pre-existing essential hypertension complicating pregnancy, unspecified trimester: Secondary | ICD-10-CM

## 2012-10-25 NOTE — Progress Notes (Signed)
[redacted]w[redacted]d Pt is non compliant - did not keep appt with [facility] nor 3hr GTT U/S for EFW and BPP at [place] C/S at 39 wks RTO 1wk

## 2012-10-27 ENCOUNTER — Telehealth: Payer: Self-pay

## 2012-10-27 NOTE — Telephone Encounter (Signed)
Called pt to get her scheduled for 3 hr GTT here in the office, per AR, also BPP/EFW U/S and then f/u after u/s with AR. On the 16th.  Diet info for preparation for the test was mailed to pt with specific instructions so that there isn't any confusion. Pt voiced understanding. [name] [name]

## 2012-10-31 ENCOUNTER — Telehealth: Payer: Self-pay | Admitting: Obstetrics and Gynecology

## 2012-10-31 NOTE — Telephone Encounter (Signed)
Advised pt per Alvino Chapel AR states that she is unable to give pt letter for absence from court. Reminded pt of apt on Thursday starting @ 9:00  Darien Ramus, CMA

## 2012-11-02 ENCOUNTER — Telehealth: Payer: Self-pay | Admitting: Obstetrics and Gynecology

## 2012-11-02 ENCOUNTER — Other Ambulatory Visit: Payer: Self-pay

## 2012-11-02 DIAGNOSIS — O10019 Pre-existing essential hypertension complicating pregnancy, unspecified trimester: Secondary | ICD-10-CM

## 2012-11-02 NOTE — Telephone Encounter (Signed)
Repeat Cesarean Section scheduled for 12/26/12 @ 10:30 with [facility]. -Adrianne Pridgen

## 2012-11-03 ENCOUNTER — Other Ambulatory Visit: Payer: Self-pay | Admitting: Obstetrics and Gynecology

## 2012-11-03 ENCOUNTER — Other Ambulatory Visit: Payer: Medicare Other

## 2012-11-03 ENCOUNTER — Encounter: Payer: Medicare Other | Admitting: Obstetrics and Gynecology

## 2012-11-03 DIAGNOSIS — O10019 Pre-existing essential hypertension complicating pregnancy, unspecified trimester: Secondary | ICD-10-CM

## 2012-11-07 ENCOUNTER — Inpatient Hospital Stay (HOSPITAL_COMMUNITY)
Admission: AD | Admit: 2012-11-07 | Discharge: 2012-11-07 | Disposition: A | Discharge status: Home / Self Care | Payer: Medicare Other | Source: Ambulatory Visit | Attending: Obstetrics and Gynecology | Admitting: Obstetrics and Gynecology

## 2012-11-07 ENCOUNTER — Encounter (HOSPITAL_COMMUNITY): Payer: Self-pay | Admitting: *Deleted

## 2012-11-07 DIAGNOSIS — G43909 Migraine, unspecified, not intractable, without status migrainosus: Secondary | ICD-10-CM

## 2012-11-07 DIAGNOSIS — O2340 Unspecified infection of urinary tract in pregnancy, unspecified trimester: Secondary | ICD-10-CM

## 2012-11-07 DIAGNOSIS — F319 Bipolar disorder, unspecified: Secondary | ICD-10-CM

## 2012-11-07 DIAGNOSIS — Z98891 History of uterine scar from previous surgery: Secondary | ICD-10-CM

## 2012-11-07 DIAGNOSIS — R7309 Other abnormal glucose: Secondary | ICD-10-CM

## 2012-11-07 DIAGNOSIS — G40909 Epilepsy, unspecified, not intractable, without status epilepticus: Secondary | ICD-10-CM

## 2012-11-07 DIAGNOSIS — O99891 Other specified diseases and conditions complicating pregnancy: Secondary | ICD-10-CM | POA: Insufficient documentation

## 2012-11-07 DIAGNOSIS — R51 Headache: Secondary | ICD-10-CM | POA: Insufficient documentation

## 2012-11-07 DIAGNOSIS — B9789 Other viral agents as the cause of diseases classified elsewhere: Secondary | ICD-10-CM | POA: Insufficient documentation

## 2012-11-07 LAB — URINALYSIS, ROUTINE W REFLEX MICROSCOPIC
Hgb urine dipstick: NEGATIVE
Nitrite: NEGATIVE
Specific Gravity, Urine: >1.03 — ABNORMAL HIGH (ref 1.005–1.030)
Urobilinogen, UA: 0.2 mg/dL (ref 0.0–1.0)

## 2012-11-07 LAB — CBC WITH DIFFERENTIAL/PLATELET
Basophils Absolute: 0 10*3/uL (ref 0.0–0.1)
Basophils Relative: 0 % (ref 0–1)
Eosinophils Absolute: 0.1 10*3/uL (ref 0.0–0.7)
Eosinophils Relative: 1 % (ref 0–5)
MCV: 70.3 fL — ABNORMAL LOW (ref 78.0–100.0)
Monocytes Absolute: 0.5 10*3/uL (ref 0.1–1.0)
Monocytes Relative: 5 % (ref 3–12)
Neutro Abs: 7.4 10*3/uL (ref 1.7–7.7)
Neutrophils Relative %: 72 % (ref 43–77)
Platelets: 407 10*3/uL — ABNORMAL HIGH (ref 150–400)
RBC: 4.48 MIL/uL (ref 3.87–5.11)
WBC: 10.2 10*3/uL (ref 4.0–10.5)

## 2012-11-07 LAB — URINE MICROSCOPIC-ADD ON

## 2012-11-07 MED ORDER — ACETAMINOPHEN-CODEINE #3 300-30 MG PO TABS: 1.0000 | ORAL_TABLET | Freq: Four times a day (QID) | ORAL | Status: DC | PRN

## 2012-11-07 NOTE — MAU Note (Addendum)
Subjective:    [name] [name] is a 32 y.o. female, G2P1001, who presents for at 32 weeks of gestation with multiple complaints: headache, diffuse myalgia, loss of appetite, nausea and overall fatigue. Reports good fetal activity. Denies contractions, vaginal bleeding or LOF. No recent household illness.   The following portions of the patient's history were reviewed and updated as appropriate: allergies, current medications, past family history.  Review of Systems Pertinent items are noted in HPI. Gastrointestinal: Negative for abdominal pain, change in bowel habits or rectal bleeding Urinary: frequency   Objective:    BP 126/81  Pulse 108  Temp 97 F (36.1 C) (Oral)  Resp 16  Ht 5\' 2"  (1.575 m)  Wt 353 lb (160.12 kg)  BMI 64.56 kg/m2  LMP 03/28/2012    Weight:  Wt Readings from Last 1 Encounters:  11/07/12 353 lb (160.12 kg)          BMI: Body mass index is 64.56 kg/(m^2).  General Appearance: Alert, appropriate appearance for age. No acute distress. HEENT: Grossly normal Neck / Thyroid: Supple, no masses, nodes or enlargement Lungs: clear to auscultation bilaterally Back: No CVA tenderness Cardiovascular: Regular rate and rhythm. S1, S2, no murmur Gastrointestinal: Soft, non-tender, no masses or organomegaly. Uterus appropriate for GA and NT  NST: reactive and reassuring. No contractions    Assessment:    Probable viral syndrome    Plan:    CBC with diff and U/A: if all normal may be D/C home with rest and Tylenol 3 Encourage PO  Dois Davenport Stepfon Rawles MD  CBC is normal U/A with moderate leukocytes: will send for culture Patient ambulating comfortably after eating a normal diet D/C home with Tylenol 3 PRN Follow-up in office tomorrow at 1;30 wit Dr Su Hilt

## 2012-11-07 NOTE — MAU Note (Signed)
C/o various symptoms that started about 1 to 1.5 weeks;

## 2012-11-08 ENCOUNTER — Encounter: Payer: Medicare Other | Admitting: Obstetrics and Gynecology

## 2012-11-09 LAB — URINE CULTURE

## 2012-11-16 ENCOUNTER — Inpatient Hospital Stay (HOSPITAL_COMMUNITY): Payer: Medicare Other

## 2012-11-16 ENCOUNTER — Other Ambulatory Visit: Payer: Self-pay | Admitting: Obstetrics and Gynecology

## 2012-11-16 ENCOUNTER — Encounter (HOSPITAL_COMMUNITY): Payer: Self-pay | Admitting: Emergency Medicine

## 2012-11-16 ENCOUNTER — Inpatient Hospital Stay (HOSPITAL_COMMUNITY)
Admission: EM | Admit: 2012-11-16 | Discharge: 2012-11-21 | DRG: 781 | Disposition: A | Discharge status: Other Acute Inpatient Hospital | Payer: Medicare Other | Attending: Emergency Medicine | Admitting: Emergency Medicine

## 2012-11-16 ENCOUNTER — Encounter (HOSPITAL_COMMUNITY): Payer: Self-pay

## 2012-11-16 DIAGNOSIS — O2650 Maternal hypotension syndrome, unspecified trimester: Secondary | ICD-10-CM | POA: Diagnosis present

## 2012-11-16 DIAGNOSIS — N390 Urinary tract infection, site not specified: Secondary | ICD-10-CM | POA: Diagnosis present

## 2012-11-16 DIAGNOSIS — O99810 Abnormal glucose complicating pregnancy: Secondary | ICD-10-CM | POA: Diagnosis present

## 2012-11-16 DIAGNOSIS — Z98891 History of uterine scar from previous surgery: Secondary | ICD-10-CM

## 2012-11-16 DIAGNOSIS — O2340 Unspecified infection of urinary tract in pregnancy, unspecified trimester: Secondary | ICD-10-CM

## 2012-11-16 DIAGNOSIS — R45851 Suicidal ideations: Secondary | ICD-10-CM

## 2012-11-16 DIAGNOSIS — G40909 Epilepsy, unspecified, not intractable, without status epilepticus: Principal | ICD-10-CM | POA: Diagnosis present

## 2012-11-16 DIAGNOSIS — O99340 Other mental disorders complicating pregnancy, unspecified trimester: Secondary | ICD-10-CM | POA: Diagnosis present

## 2012-11-16 DIAGNOSIS — E669 Obesity, unspecified: Secondary | ICD-10-CM | POA: Diagnosis present

## 2012-11-16 DIAGNOSIS — O99210 Obesity complicating pregnancy, unspecified trimester: Secondary | ICD-10-CM | POA: Diagnosis present

## 2012-11-16 DIAGNOSIS — O10019 Pre-existing essential hypertension complicating pregnancy, unspecified trimester: Secondary | ICD-10-CM | POA: Diagnosis present

## 2012-11-16 DIAGNOSIS — O34219 Maternal care for unspecified type scar from previous cesarean delivery: Secondary | ICD-10-CM | POA: Diagnosis present

## 2012-11-16 DIAGNOSIS — R7309 Other abnormal glucose: Secondary | ICD-10-CM | POA: Diagnosis present

## 2012-11-16 DIAGNOSIS — R51 Headache: Secondary | ICD-10-CM | POA: Diagnosis present

## 2012-11-16 DIAGNOSIS — O2390 Unspecified genitourinary tract infection in pregnancy, unspecified trimester: Secondary | ICD-10-CM | POA: Diagnosis present

## 2012-11-16 DIAGNOSIS — Z3689 Encounter for other specified antenatal screening: Secondary | ICD-10-CM

## 2012-11-16 DIAGNOSIS — G43909 Migraine, unspecified, not intractable, without status migrainosus: Secondary | ICD-10-CM | POA: Diagnosis present

## 2012-11-16 DIAGNOSIS — Z6841 Body Mass Index (BMI) 40.0 and over, adult: Secondary | ICD-10-CM

## 2012-11-16 DIAGNOSIS — O24419 Gestational diabetes mellitus in pregnancy, unspecified control: Secondary | ICD-10-CM

## 2012-11-16 DIAGNOSIS — F319 Bipolar disorder, unspecified: Secondary | ICD-10-CM | POA: Diagnosis present

## 2012-11-16 DIAGNOSIS — Z3490 Encounter for supervision of normal pregnancy, unspecified, unspecified trimester: Secondary | ICD-10-CM

## 2012-11-16 LAB — CBC WITH DIFFERENTIAL/PLATELET
Basophils Absolute: 0 10*3/uL (ref 0.0–0.1)
Eosinophils Relative: 1 % (ref 0–5)
HCT: 37 % (ref 36.0–46.0)
Hemoglobin: 11.2 g/dL — ABNORMAL LOW (ref 12.0–15.0)
Lymphs Abs: 2.8 10*3/uL (ref 0.7–4.0)
MCH: 21.7 pg — ABNORMAL LOW (ref 26.0–34.0)
MCV: 71.6 fL — ABNORMAL LOW (ref 78.0–100.0)
Monocytes Relative: 6 % (ref 3–12)
Neutrophils Relative %: 64 % (ref 43–77)
Platelets: 350 10*3/uL (ref 150–400)
RDW: 18.3 % — ABNORMAL HIGH (ref 11.5–15.5)

## 2012-11-16 LAB — BASIC METABOLIC PANEL
BUN: 5 mg/dL — ABNORMAL LOW (ref 6–23)
Chloride: 103 mEq/L (ref 96–112)
Creatinine, Ser: 0.46 mg/dL — ABNORMAL LOW (ref 0.50–1.10)
GFR calc Af Amer: >90 mL/min (ref >90)
GFR calc non Af Amer: >90 mL/min (ref >90)
Potassium: 4 mEq/L (ref 3.5–5.1)

## 2012-11-16 LAB — URINE MICROSCOPIC-ADD ON

## 2012-11-16 LAB — URINALYSIS, ROUTINE W REFLEX MICROSCOPIC
Bilirubin Urine: NEGATIVE
Ketones, ur: NEGATIVE mg/dL
Nitrite: NEGATIVE
Specific Gravity, Urine: 1.015 (ref 1.005–1.030)
Urobilinogen, UA: 0.2 mg/dL (ref 0.0–1.0)
pH: 7 (ref 5.0–8.0)

## 2012-11-16 LAB — ABO/RH: ABO/RH(D): O POS

## 2012-11-16 LAB — GLUCOSE, CAPILLARY
Glucose-Capillary: 104 mg/dL — ABNORMAL HIGH (ref 70–99)
Glucose-Capillary: 100 mg/dL — ABNORMAL HIGH (ref 70–99)

## 2012-11-16 LAB — PREPARE RBC (CROSSMATCH)

## 2012-11-16 MED ORDER — DOCUSATE SODIUM 100 MG PO CAPS
100.0000 mg | ORAL_CAPSULE | Freq: Every day | ORAL | Status: DC
  Administered 2012-11-16 – 2012-11-21 (×5): 100 mg via ORAL
  Filled 2012-11-16 (×6): qty 1

## 2012-11-16 MED ORDER — PRENATAL MULTIVITAMIN CH
1.0000 | ORAL_TABLET | Freq: Every day | ORAL | Status: DC
  Administered 2012-11-16: 1 via ORAL
  Filled 2012-11-16 (×2): qty 1

## 2012-11-16 MED ORDER — ACETAMINOPHEN-CODEINE #3 300-30 MG PO TABS
1.0000 | ORAL_TABLET | Freq: Four times a day (QID) | ORAL | Status: DC | PRN
  Administered 2012-11-16: 2 via ORAL
  Administered 2012-11-18: 1 via ORAL
  Administered 2012-11-19 – 2012-11-21 (×5): 2 via ORAL
  Filled 2012-11-16 (×8): qty 2
  Filled 2012-11-16: qty 1

## 2012-11-16 MED ORDER — SODIUM CHLORIDE 0.9 % IV SOLN: 250.0000 mL | INTRAVENOUS | Status: DC | PRN

## 2012-11-16 MED ORDER — PRENATAL MULTIVITAMIN CH
1.0000 | ORAL_TABLET | Freq: Every day | ORAL | Status: DC
  Administered 2012-11-18 – 2012-11-21 (×5): 1 via ORAL
  Filled 2012-11-16 (×4): qty 1

## 2012-11-16 MED ORDER — BUTORPHANOL TARTRATE 1 MG/ML IJ SOLN
1.0000 mg | Freq: Once | INTRAMUSCULAR | Status: AC
  Administered 2012-11-16: 1 mg via INTRAVENOUS
  Filled 2012-11-16: qty 1

## 2012-11-16 MED ORDER — SODIUM CHLORIDE 0.9 % IJ SOLN
3.0000 mL | Freq: Two times a day (BID) | INTRAMUSCULAR | Status: DC
  Administered 2012-11-16 (×2): 3 mL via INTRAVENOUS

## 2012-11-16 MED ORDER — SODIUM CHLORIDE 0.9 % IJ SOLN
3.0000 mL | INTRAMUSCULAR | Status: DC | PRN
  Administered 2012-11-17: 3 mL via INTRAVENOUS

## 2012-11-16 MED ORDER — CEFAZOLIN SODIUM-DEXTROSE 2-3 GM-% IV SOLR: 2.0000 g | INTRAVENOUS | Status: DC

## 2012-11-16 MED ORDER — ACETAMINOPHEN 325 MG PO TABS
650.0000 mg | ORAL_TABLET | ORAL | Status: DC | PRN
  Administered 2012-11-16: 650 mg via ORAL
  Filled 2012-11-16: qty 2

## 2012-11-16 MED ORDER — ZOLPIDEM TARTRATE 5 MG PO TABS
5.0000 mg | ORAL_TABLET | Freq: Every evening | ORAL | Status: DC | PRN
  Administered 2012-11-16 – 2012-11-21 (×4): 5 mg via ORAL
  Filled 2012-11-16 (×4): qty 1

## 2012-11-16 MED ORDER — PROMETHAZINE HCL 25 MG/ML IJ SOLN
12.5000 mg | Freq: Four times a day (QID) | INTRAMUSCULAR | Status: DC | PRN
  Administered 2012-11-16: 12.5 mg via INTRAVENOUS
  Administered 2012-11-17: 25 mg via INTRAVENOUS
  Filled 2012-11-16 (×2): qty 1

## 2012-11-16 MED ORDER — LEVETIRACETAM ER 750 MG PO TB24: 750.0000 mg | ORAL_TABLET | Freq: Four times a day (QID) | ORAL | Status: DC

## 2012-11-16 MED ORDER — LEVETIRACETAM ER 500 MG PO TB24
1500.0000 mg | ORAL_TABLET | Freq: Two times a day (BID) | ORAL | Status: DC
  Administered 2012-11-16 – 2012-11-21 (×10): 1500 mg via ORAL
  Filled 2012-11-16 (×13): qty 3

## 2012-11-16 MED ORDER — CALCIUM CARBONATE ANTACID 500 MG PO CHEW
2.0000 | CHEWABLE_TABLET | ORAL | Status: DC | PRN
  Filled 2012-11-16: qty 2

## 2012-11-16 NOTE — ED Notes (Signed)
OB Rapid Response RN at bedside, setting up external fetal monitor.
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
## 2012-11-16 NOTE — ED Notes (Signed)
MD and Pa at bedside--- ultrasound to abdomen in progress.

## 2012-11-16 NOTE — ED Notes (Signed)
Brought in by EMS from home with c/o generalized body pain after "?seizure".  Pt arrived to ED room A/Ox4, in no apparent distress.  Per EMS, pt found herself waking up in her living room--- does not remember what happened, pt has hx of seizure-- on Keppra; pt is 33-week pregnant.
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
## 2012-11-16 NOTE — ED Notes (Signed)
Patient taken off external monitors for transfer to [facility] via carelink for further evaluation.

## 2012-11-16 NOTE — Progress Notes (Signed)
CSW met with pt arrange transportation services.  CSW gave pt a SCAT application to complete and left a portion of the application with RN, for OBGYN to complete.  Once application is completed by pt and physician, CSW will fax completed application to [employer].  While application is being processed, CSW encouraged the pt to use Medicaid transportation & will provide with contact information.  Pt was appreciative and thanked CSW for resources.  CSW will follow up with pt once application complete.

## 2012-11-16 NOTE — MAU Note (Signed)
Pt states went to [facility] for ? eval of ? Possible seizures, is on highest dose of Keppra and takes topamax for headaches.

## 2012-11-16 NOTE — Progress Notes (Signed)
Called to assess 33 week  G2P1 who had a reported seizure at home.

## 2012-11-16 NOTE — Progress Notes (Addendum)
Inpatient Diabetes Program Recommendations  Diabetes Treatment Program Recommendations  ADA Standards of Care 2012 Diabetes in Pregnancy Target Glucose Ranges:  Fasting: 60 - 90 mg/dL Preprandial: 60 - 098 mg/dL 1 hr postprandial: Less than 140mg /dL (from first bite of meal) 2 hr postprandial: Less than 120 mg/dL (from first bit of meal)    Reason for Visit: Referral/consult to diabetes educator Education is done primarily by the staff/bedside RN. Have ordered an RD consult for basic diet education. Have ordered the staff to assist pt with watching the ed'l videos on the TV network. Pt should also be referred to the OP [facility] for education at discharge.  I can order and have the center call patient while in the hospital to schedule an ed'l class for this patient. If patient should need medication/insulin therapy, I am glad to make recommendations based on wks gestation and weight (per protocol). Glucose values thus far are well controlled.  Noted 1 hr GTT in Dec of 2013 done at 1359 on 10/05/12.  Noted HgbA1C on 07/08/12 at 5.7% which marks the minimal level for "at risk for diabetes or pre-diabetes"  However glucose is well controlled on admission. Wonder if patient was truly fasting at the time the test was done. Please order HgbA1C at this time to assess true diagnosis of GDM Note:Will follow and assist as needed. Thank you, Lenor Coffin, RN, CNS, Diabetes Coordinator [phone])

## 2012-11-16 NOTE — H&P (Addendum)
[name] is [birth year]  Patient's last menstrual period was 03/28/2012. [redacted]w[redacted]d.  Pt was transferred from [facility] ED for a possible seizure.  She said she just blacked out  Pt presents with Previous cesarean section [V45.89] Migraines [346.90] Elevated hemoglobin A1c [790.6] Seizure disorder [345.90] Bipolar disorder [296.80] UTI (urinary tract infection) during pregnancy [646.60, 599.0] Pregnant [V22.2] Morbid obesity with BMI of 60.0-69.9, adult [278.01, V85.44] seizure  Pregnancy complicated by See Problem List PMHX  Past Medical History  Diagnosis Date  . Migraines     would be rx'd Percacet for relief  . Seizures 05/2011    unsure what started seizures;Neurology appt on 05/30/12;on Kepra 2000mg  bid;was on Topramax 50mg  tid  . Bipolar affective disorder     no meds currently;has been years since taking meds  . Infection     Yeast inf;not frequent  . Anemia     has taken iron supplements in the past  . Yeast infection   . H/O varicella   . Wears glasses   . Hypertension     Pt states she had pre-eclampsia w last pregnancy   . Pregnancy induced hypertension     PSURG HX  Past Surgical History  Procedure Date  . Cesarean section   . Wisdom tooth extraction     All 4 removed  . Tonsillectomy     During high school years    Fam HX  Family History  Problem Relation Age of Onset  . Cancer Mother     Ovarian  . Diabetes Mother   . Hypertension Father   . Cirrhosis Father   . Hypertension Brother   . Asthma Son     Soc HX  History   Social History  . Marital Status: Single    Spouse Name: N/A    Number of Children: 1  . Years of Education: 13   Occupational History  .      Pt is disabled   Social History Main Topics  . Smoking status: Never Smoker   . Smokeless tobacco: Never Used  . Alcohol Use: No  . Drug Use: No  . Sexually Active: Yes -- Female partner(s)    Birth Control/ Protection: None   Other Topics Concern  . Not on file    Social History Narrative  . No narrative on file    ROS  Review of Systems - Psychological ROS: positive for - history of Bipolar disorder Neurological ROS: h/o seizure activity  [place] report pending Results for orders placed during the hospital encounter of 11/16/12  CBC WITH DIFFERENTIAL      Component Value Range   WBC 9.8  4.0 - 10.5 K/uL   RBC 5.17 (*) 3.87 - 5.11 MIL/uL   Hemoglobin 11.2 (*) 12.0 - 15.0 g/dL   HCT 16.1  09.6 - 04.5 %   MCV 71.6 (*) 78.0 - 100.0 fL   MCH 21.7 (*) 26.0 - 34.0 pg   MCHC 30.3  30.0 - 36.0 g/dL   RDW 40.9 (*) 81.1 - 91.4 %   Platelets 350  150 - 400 K/uL   Neutrophils Relative 64  43 - 77 %   Lymphocytes Relative 29  12 - 46 %   Monocytes Relative 6  3 - 12 %   Eosinophils Relative 1  0 - 5 %   Basophils Relative 0  0 - 1 %   Neutro Abs 6.3  1.7 - 7.7 K/uL   Lymphs Abs 2.8  0.7 -  4.0 K/uL   Monocytes Absolute 0.6  0.1 - 1.0 K/uL   Eosinophils Absolute 0.1  0.0 - 0.7 K/uL   Basophils Absolute 0.0  0.0 - 0.1 K/uL   RBC Morphology POLYCHROMASIA PRESENT    URINALYSIS, ROUTINE W REFLEX MICROSCOPIC      Component Value Range   Color, Urine YELLOW  YELLOW   APPearance CLOUDY (*) CLEAR   Specific Gravity, Urine 1.015  1.005 - 1.030   pH 7.0  5.0 - 8.0   Glucose, UA NEGATIVE  NEGATIVE mg/dL   Hgb urine dipstick NEGATIVE  NEGATIVE   Bilirubin Urine NEGATIVE  NEGATIVE   Ketones, ur NEGATIVE  NEGATIVE mg/dL   Protein, ur NEGATIVE  NEGATIVE mg/dL   Urobilinogen, UA 0.2  0.0 - 1.0 mg/dL   Nitrite NEGATIVE  NEGATIVE   Leukocytes, UA TRACE (*) NEGATIVE  BASIC METABOLIC PANEL      Component Value Range   Sodium 134 (*) 135 - 145 mEq/L   Potassium 4.0  3.5 - 5.1 mEq/L   Chloride 103  96 - 112 mEq/L   CO2 18 (*) 19 - 32 mEq/L   Glucose, Bld 85  70 - 99 mg/dL   BUN 5 (*) 6 - 23 mg/dL   Creatinine, Ser 7.82 (*) 0.50 - 1.10 mg/dL   Calcium 8.8  8.4 - 95.6 mg/dL   GFR calc non Af Amer >90  >90 mL/min   GFR calc Af Amer >90  >90 mL/min  URINE  MICROSCOPIC-ADD ON      Component Value Range   Squamous Epithelial / LPF MANY (*) RARE   WBC, UA 3-6  <3 WBC/hpf   Bacteria, UA FEW (*) RARE  GLUCOSE, CAPILLARY      Component Value Range   Glucose-Capillary 90  70 - 99 mg/dL  TYPE AND SCREEN      Component Value Range   ABO/RH(D) O POS     Antibody Screen NEG     Sample Expiration 11/19/2012    PREPARE RBC (CROSSMATCH)      Component Value Range   Order Confirmation ORDER PROCESSED BY BLOOD BANK    ABO/RH      Component Value Range   ABO/RH(D) O POS      Gen WDWN  IN NAD CV RRR LUNGS CTAB ABD Gravid soft NT GU cx l/cl EXT neg homans sign B A/P: IUP [redacted]w[redacted]d SP questionable seizure activity Admit to antepartum MFM consult and US done.   24 hour urine and PIH labs.  UA neg.  pts has no post ictal signs.  Will repeat 24 hour urine  Keppra and neurology consult in the morning.   Pt with GDM will do fasting and 2 hr pp with consult for diabetic teaching and low carb diet. Pt has been noncompliant.  She states she needs transportation to her appointments.  Will consult SW   Monitor pt closely

## 2012-11-16 NOTE — Progress Notes (Signed)
MFM , social worker, diabetic coordinator consults ordered

## 2012-11-16 NOTE — ED Notes (Signed)
Pt alert and oriented x4. Respirations even and unlabored, bilateral symmetrical rise and fall of chest. Skin warm and dry. In no acute distress. Denies needs.   

## 2012-11-16 NOTE — Progress Notes (Signed)
Dietician/ Dr. Camillio(neurology) notified of consult orders

## 2012-11-16 NOTE — ED Provider Notes (Signed)
History     CSN: [id number]  Arrival date & time 11/16/12  4098   First MD Initiated Contact with Patient 11/16/12 [phone]      Chief Complaint  Patient presents with  . Seizures    (Consider location/radiation/quality/duration/timing/severity/associated sxs/prior treatment) HPI Comments: Pt state that she thought that she heard someone at the door and the next thing she new she was on the floor:pt states that she is having generalized body aches but that has been going on for weeks:pt denies dizziness, n/v/d, fever,gush of fluid or abdominal pain:pt states that she has a history of seizure that she takes keppra for:pt states that she has been taking her medication:pt states that she is 33 weeks and has been follow by [facility]:ems states that pt was awake and alert on arrival  The history is provided by the patient. No language interpreter was used.    Past Medical History  Diagnosis Date  . Migraines     would be rx'd Percacet for relief  . Seizures 05/2011    unsure what started seizures;Neurology appt on 05/30/12;on Kepra 2000mg  bid;was on Topramax 50mg  tid  . Bipolar affective disorder     no meds currently;has been years since taking meds  . Infection     Yeast inf;not frequent  . Anemia     has taken iron supplements in the past  . Yeast infection   . H/O varicella   . Wears glasses   . Hypertension     Pt states she had pre-eclampsia w last pregnancy   . Pregnancy induced hypertension     Past Surgical History  Procedure Date  . Cesarean section   . Wisdom tooth extraction     All 4 removed  . Tonsillectomy     During high school years    Family History  Problem Relation Age of Onset  . Other Neg Hx   . Cancer Mother     Ovarian  . Diabetes Mother   . Hypertension Father   . Cirrhosis Father   . Hypertension Brother   . Asthma Son     History  Substance Use Topics  . Smoking status: Never Smoker   . Smokeless tobacco: Never Used  . Alcohol  Use: No    OB History    Grav Para Term Preterm Abortions TAB SAB Ect Mult Living   2 1 1       1       Review of Systems  Constitutional: Negative.   Respiratory: Negative.   Cardiovascular: Negative.     Allergies  Review of patient's allergies indicates no known allergies.  Home Medications   Current Outpatient Rx  Name  Route  Sig  Dispense  Refill  . ACETAMINOPHEN 325 MG PO TABS   Oral   Take 650 mg by mouth 2 (two) times daily as needed. For pain         . ACETAMINOPHEN-CODEINE #3 300-30 MG PO TABS   Oral   Take 1-2 tablets by mouth every 6 (six) hours as needed for pain.   15 tablet   0   . LEVETIRACETAM ER 750 MG PO TB24   Oral   Take 750 mg by mouth 4 (four) times daily.          Marland Kitchen PRENATAL MULTIVITAMIN CH   Oral   Take 1 tablet by mouth daily.         Marland Kitchen PROMETHAZINE HCL 12.5 MG PO TABS   Oral  Take 12.5 mg by mouth every 4 (four) hours as needed. nausea         . VITAMIN D (CHOLECALCIFEROL) PO   Oral   Take 4,000 Units by mouth daily.           BP 153/78  Pulse 99  Temp 97.9 F (36.6 C) (Oral)  Resp 17  SpO2 100%  LMP 03/28/2012  Physical Exam  Nursing note and vitals reviewed. Constitutional: She is oriented to person, place, and time. She appears well-developed and well-nourished.  HENT:  Head: Normocephalic and atraumatic.  Eyes: Conjunctivae normal and EOM are normal. Pupils are equal, round, and reactive to light.  Neck: Normal range of motion. Neck supple.  Cardiovascular: Normal rate and regular rhythm.   Pulmonary/Chest: Effort normal and breath sounds normal.  Abdominal: Soft.       Morbidly obese  Musculoskeletal:       Cervical back: Normal.       Thoracic back: Normal.       Lumbar back: Normal.  Neurological: She is alert and oriented to person, place, and time.  Skin: Skin is warm and dry.  Psychiatric: She has a normal mood and affect.    ED Course  Procedures (including critical care time)  Labs  Reviewed  CBC WITH DIFFERENTIAL - Abnormal; Notable for the following:    RBC 5.17 (*)     Hemoglobin 11.2 (*)     MCV 71.6 (*)     MCH 21.7 (*)     RDW 18.3 (*)     All other components within normal limits  URINALYSIS, ROUTINE W REFLEX MICROSCOPIC - Abnormal; Notable for the following:    APPearance CLOUDY (*)     Leukocytes, UA TRACE (*)     All other components within normal limits  BASIC METABOLIC PANEL - Abnormal; Notable for the following:    Sodium 134 (*)     CO2 18 (*)     BUN 5 (*)     Creatinine, Ser 0.46 (*)     All other components within normal limits  URINE MICROSCOPIC-ADD ON - Abnormal; Notable for the following:    Squamous Epithelial / LPF MANY (*)     Bacteria, UA FEW (*)     All other components within normal limits  URINE CULTURE   No results found.  Date: 11/16/2012  Rate: 83  Rhythm: normal sinus rhythm  QRS Axis: normal  Intervals: normal  ST/T Wave abnormalities: normal  Conduction Disutrbances:none  Narrative Interpretation:   Old EKG Reviewed: unchanged    No diagnosis found.    MDM  Pt showing no postictal signs:doubt seizure unsure if syncope:pt to be transferred over to [facility] for further evaluation:rapid response nurse here:pt cleared to go to [facility]        Teressa Lower, NP 11/18/12 1203

## 2012-11-16 NOTE — Progress Notes (Addendum)
Dr. Normand Sloop at nurses station, 24 hour urine ordered and started, pt informed and educated. Orders for Stadol and Phenergan for headache.  Stated that if pain medication ineffective to call and order for MRI would be placed.

## 2012-11-16 NOTE — ED Notes (Signed)
Dr. Normand Sloop notified of patients status Orders to transfer patient to [facility] hospital MAU for further eval.  Spoke to V. Pickering NP and plans are to transfer once lab results final

## 2012-11-17 DIAGNOSIS — Z331 Pregnant state, incidental: Secondary | ICD-10-CM

## 2012-11-17 DIAGNOSIS — G40909 Epilepsy, unspecified, not intractable, without status epilepticus: Secondary | ICD-10-CM

## 2012-11-17 LAB — CBC
MCH: 21.5 pg — ABNORMAL LOW (ref 26.0–34.0)
Platelets: 384 10*3/uL (ref 150–400)
RBC: 4.78 MIL/uL (ref 3.87–5.11)

## 2012-11-17 LAB — TSH: TSH: 2.455 u[IU]/mL (ref 0.350–4.500)

## 2012-11-17 LAB — COMPREHENSIVE METABOLIC PANEL
CO2: 25 mEq/L (ref 19–32)
Calcium: 9 mg/dL (ref 8.4–10.5)
Creatinine, Ser: 0.76 mg/dL (ref 0.50–1.10)
GFR calc Af Amer: >90 mL/min (ref >90)
GFR calc non Af Amer: >90 mL/min (ref >90)
Glucose, Bld: 107 mg/dL — ABNORMAL HIGH (ref 70–99)
Sodium: 138 mEq/L (ref 135–145)
Total Protein: 6 g/dL (ref 6.0–8.3)

## 2012-11-17 LAB — RPR: RPR Ser Ql: NONREACTIVE

## 2012-11-17 LAB — PROTEIN, URINE, 24 HOUR
Collection Interval-UPROT: 24 hours
Urine Total Volume-UPROT: 1500 mL

## 2012-11-17 LAB — CREATININE CLEARANCE, URINE, 24 HOUR
Creatinine Clearance: 180 mL/min — ABNORMAL HIGH (ref 75–115)
Creatinine, 24H Ur: 1972 mg/d — ABNORMAL HIGH (ref 700–1800)
Creatinine: 0.76 mg/dL (ref 0.50–1.10)
Urine Total Volume-CRCL: 1500 mL

## 2012-11-17 LAB — GLUCOSE, CAPILLARY
Glucose-Capillary: 94 mg/dL (ref 70–99)
Glucose-Capillary: 168 mg/dL — ABNORMAL HIGH (ref 70–99)

## 2012-11-17 LAB — URINE CULTURE: Colony Count: 70000

## 2012-11-17 LAB — URIC ACID: Uric Acid, Serum: 4.1 mg/dL (ref 2.4–7.0)

## 2012-11-17 MED ORDER — BUTALBITAL-APAP-CAFFEINE 50-325-40 MG PO TABS
2.0000 | ORAL_TABLET | Freq: Four times a day (QID) | ORAL | Status: DC | PRN
  Administered 2012-11-17 – 2012-11-21 (×9): 2 via ORAL
  Filled 2012-11-17 (×9): qty 2

## 2012-11-17 NOTE — Progress Notes (Signed)
Dr Estanislado Pandy notified of no IV access no orders to restart

## 2012-11-17 NOTE — Progress Notes (Signed)
  Nutrition Dx: Food and nutrition-related knowledge deficit r/t no previous education aeb newly diagnosed GDM.    Nutrition education consult for Carbohydrate Modified Gestational Diabetic Diet completed.  "Meal  plan for gestational diabetics" handout given to patient.  Basic concepts reviewed.  Pt with headache at time of education. Was able to teach serum glucose goals, meal pattern with CHO limits, types of foods that contain CHO. Pt practiced meal planning by ordering a meal.   If pt needs to be compliant to this diet she will further outpatient education.   Questions answered.  Patient has limited understanding by my assessment , but did not want further education at this time.  Elisabeth Cara M.Odis Luster LDN Neonatal Nutrition Support Specialist Pager [phone]

## 2012-11-17 NOTE — Consult Note (Signed)
NEURO HOSPITALIST CONSULT NOTE    Reason for Consult: Questionable seizure  HPI:                                                                                                                                          [name] [name] is an 32 y.o. female (poor historian) who was brought to the hospital due to having a questionable syncope versus seizure.  She states she has been compliant with her seizure medications however is unable to tell me what dose she is on and last time she took her medication.  On the morning of 11/16/12 at 4 am she got up out of bed because she thought someone was at the door. She does not recall what happened after--she does remember waking up on the floor, no tongue biting, no incontinence and only confused for about "2-3" minutes.  She then stood up and called 911. Patient is on 1500 mg Keppra BID. Prior to pregnancy she was on Topmax which was stopped after she was found to be pregnant. Currently she is having a HA 8/10 with no other deficits.   Past Medical History  Diagnosis Date  . Migraines     would be rx'd Percacet for relief  . Seizures 05/2011    unsure what started seizures;Neurology appt on 05/30/12;on Kepra 2000mg  bid;was on Topramax 50mg  tid  . Bipolar affective disorder     no meds currently;has been years since taking meds  . Infection     Yeast inf;not frequent  . Anemia     has taken iron supplements in the past  . Yeast infection   . H/O varicella   . Wears glasses   . Hypertension     Pt states she had pre-eclampsia w last pregnancy   . Pregnancy induced hypertension     Past Surgical History  Procedure Date  . Cesarean section   . Wisdom tooth extraction     All 4 removed  . Tonsillectomy     During high school years    Family History  Problem Relation Age of Onset  . Cancer Mother     Ovarian  . Diabetes Mother   . Hypertension Father   . Cirrhosis Father   . Hypertension Brother   . Asthma Son     Social History:  reports that she has never smoked. She has never used smokeless tobacco. She reports that she does not drink alcohol or use illicit drugs.  No Known Allergies  MEDICATIONS:  Prior to Admission:  Prescriptions prior to admission  Medication Sig Dispense Refill  . acetaminophen (TYLENOL) 325 MG tablet Take 650 mg by mouth 2 (two) times daily as needed. For pain      . acetaminophen-codeine (TYLENOL #3) 300-30 MG per tablet Take 1-2 tablets by mouth every 6 (six) hours as needed for pain.  15 tablet  0  . Levetiracetam (KEPPRA XR) 750 MG TB24 Take 750 mg by mouth 4 (four) times daily.       . Prenatal Vit-Fe Fumarate-FA (PRENATAL MULTIVITAMIN) TABS Take 1 tablet by mouth daily.      . promethazine (PHENERGAN) 12.5 MG tablet Take 12.5 mg by mouth every 4 (four) hours as needed. nausea      . VITAMIN D, CHOLECALCIFEROL, PO Take 4,000 Units by mouth daily.       Scheduled:   . docusate sodium  100 mg Oral Daily  . levETIRAcetam  1,500 mg Oral Q12H  . prenatal multivitamin  1 tablet Oral Daily  . sodium chloride  3 mL Intravenous Q12H     ROS:                                                                                                                                       History obtained from the patient  General ROS: negative for - chills, fatigue, fever, night sweats, weight gain or weight loss Psychological ROS: negative for - behavioral disorder, hallucinations, memory difficulties, mood swings or suicidal ideation Ophthalmic ROS: negative for - blurry vision, double vision, eye pain or loss of vision ENT ROS: negative for - epistaxis, nasal discharge, oral lesions, sore throat, tinnitus or vertigo Allergy and Immunology ROS: negative for - hives or itchy/watery eyes Hematological and Lymphatic ROS: negative for - bleeding problems, bruising or  swollen lymph nodes Endocrine ROS: negative for - galactorrhea, hair pattern changes, polydipsia/polyuria or temperature intolerance Respiratory ROS: negative for - cough, hemoptysis, shortness of breath or wheezing Cardiovascular ROS: negative for - chest pain, dyspnea on exertion, edema or irregular heartbeat Gastrointestinal ROS: negative for - abdominal pain, diarrhea, hematemesis, nausea/vomiting or stool incontinence Genito-Urinary ROS: negative for - dysuria, hematuria, incontinence or urinary frequency/urgency Musculoskeletal ROS: negative for - joint swelling or muscular weakness Neurological ROS: as noted in HPI Dermatological ROS: negative for rash and skin lesion changes   Blood pressure 139/58, pulse 108, temperature 98.2 F (36.8 C), temperature source Oral, resp. rate 22, height 5\' 1"  (1.549 m), weight 152.409 kg (336 lb), last menstrual period 03/28/2012, SpO2 100.00%.   Neurologic Examination:  Mental Status: Alert, oriented, thought content appropriate.  Speech fluent without evidence of aphasia.  Able to follow 3 step commands without difficulty. Cranial Nerves: II: Discs flat bilaterally; Visual fields grossly normal, pupils equal, round, reactive to light and accommodation III,IV, VI: ptosis not present, extra-ocular motions intact bilaterally V,VII: smile symmetric, facial light touch sensation normal bilaterally VIII: hearing normal bilaterally IX,X: gag reflex present XI: bilateral shoulder shrug XII: midline tongue extension Motor: Right : Upper extremity   5/5    Left:     Upper extremity   5/5  Lower extremity   5/5     Lower extremity   5/5 Tone and bulk:normal tone throughout; no atrophy noted Sensory: Pinprick and light touch intact throughout, bilaterally Deep Tendon Reflexes: 1+ and symmetric throughout Plantars: Right: downgoing   Left:  downgoing Cerebellar: normal finger-to-nose,  normal heel-to-shin test  CV: pulses palpable throughout     No results found for this basename: cbc, bmp, coags, chol, tri, ldl, hga1c    Results for orders placed during the hospital encounter of 11/16/12 (from the past 48 hour(s))  CBC WITH DIFFERENTIAL     Status: Abnormal   Collection Time   11/16/12  6:55 AM      Component Value Range Comment   WBC 9.8  4.0 - 10.5 K/uL    RBC 5.17 (*) 3.87 - 5.11 MIL/uL    Hemoglobin 11.2 (*) 12.0 - 15.0 g/dL    HCT 16.1  09.6 - 04.5 %    MCV 71.6 (*) 78.0 - 100.0 fL    MCH 21.7 (*) 26.0 - 34.0 pg    MCHC 30.3  30.0 - 36.0 g/dL    RDW 40.9 (*) 81.1 - 15.5 %    Platelets 350  150 - 400 K/uL    Neutrophils Relative 64  43 - 77 %    Lymphocytes Relative 29  12 - 46 %    Monocytes Relative 6  3 - 12 %    Eosinophils Relative 1  0 - 5 %    Basophils Relative 0  0 - 1 %    Neutro Abs 6.3  1.7 - 7.7 K/uL    Lymphs Abs 2.8  0.7 - 4.0 K/uL    Monocytes Absolute 0.6  0.1 - 1.0 K/uL    Eosinophils Absolute 0.1  0.0 - 0.7 K/uL    Basophils Absolute 0.0  0.0 - 0.1 K/uL    RBC Morphology POLYCHROMASIA PRESENT     BASIC METABOLIC PANEL     Status: Abnormal   Collection Time   11/16/12  6:55 AM      Component Value Range Comment   Sodium 134 (*) 135 - 145 mEq/L    Potassium 4.0  3.5 - 5.1 mEq/L    Chloride 103  96 - 112 mEq/L    CO2 18 (*) 19 - 32 mEq/L    Glucose, Bld 85  70 - 99 mg/dL    BUN 5 (*) 6 - 23 mg/dL    Creatinine, Ser 9.14 (*) 0.50 - 1.10 mg/dL    Calcium 8.8  8.4 - 78.2 mg/dL    GFR calc non Af Amer >90  >90 mL/min    GFR calc Af Amer >90  >90 mL/min   URINALYSIS, ROUTINE W REFLEX MICROSCOPIC     Status: Abnormal   Collection Time   11/16/12  7:30 AM      Component Value Range Comment   Color, Urine YELLOW  YELLOW  APPearance CLOUDY (*) CLEAR    Specific Gravity, Urine 1.015  1.005 - 1.030    pH 7.0  5.0 - 8.0    Glucose, UA NEGATIVE  NEGATIVE mg/dL    Hgb urine dipstick  NEGATIVE  NEGATIVE    Bilirubin Urine NEGATIVE  NEGATIVE    Ketones, ur NEGATIVE  NEGATIVE mg/dL    Protein, ur NEGATIVE  NEGATIVE mg/dL    Urobilinogen, UA 0.2  0.0 - 1.0 mg/dL    Nitrite NEGATIVE  NEGATIVE    Leukocytes, UA TRACE (*) NEGATIVE   URINE MICROSCOPIC-ADD ON     Status: Abnormal   Collection Time   11/16/12  7:30 AM      Component Value Range Comment   Squamous Epithelial / LPF MANY (*) RARE    WBC, UA 3-6  <3 WBC/hpf    Bacteria, UA FEW (*) RARE   GLUCOSE, CAPILLARY     Status: Normal   Collection Time   11/16/12 11:57 AM      Component Value Range Comment   Glucose-Capillary 90  70 - 99 mg/dL   PREPARE RBC (CROSSMATCH)     Status: Normal   Collection Time   11/16/12 12:30 PM      Component Value Range Comment   Order Confirmation ORDER PROCESSED BY BLOOD BANK     RPR     Status: Normal   Collection Time   11/16/12 12:30 PM      Component Value Range Comment   RPR NON REACTIVE  NON REACTIVE   TYPE AND SCREEN     Status: Normal (Preliminary result)   Collection Time   11/16/12  2:08 PM      Component Value Range Comment   ABO/RH(D) O POS      Antibody Screen NEG      Sample Expiration 11/19/2012      Unit Number [id number]      Blood Component Type RED CELLS,LR      Unit division 00      Status of Unit ALLOCATED      Transfusion Status OK TO TRANSFUSE      Crossmatch Result Compatible      Unit Number [id number]      Blood Component Type RED CELLS,LR      Unit division 00      Status of Unit ALLOCATED      Transfusion Status OK TO TRANSFUSE      Crossmatch Result Compatible     ABO/RH     Status: Normal   Collection Time   11/16/12  2:10 PM      Component Value Range Comment   ABO/RH(D) O POS     GLUCOSE, CAPILLARY     Status: Abnormal   Collection Time   11/16/12  3:58 PM      Component Value Range Comment   Glucose-Capillary 104 (*) 70 - 99 mg/dL   GLUCOSE, CAPILLARY     Status: Abnormal   Collection Time   11/16/12  7:41 PM      Component  Value Range Comment   Glucose-Capillary 100 (*) 70 - 99 mg/dL   GLUCOSE, CAPILLARY     Status: Abnormal   Collection Time   11/16/12 10:20 PM      Component Value Range Comment   Glucose-Capillary 100 (*) 70 - 99 mg/dL   PREPARE RBC (CROSSMATCH)     Status: Normal   Collection Time   11/16/12 10:30 PM      Component Value  Range Comment   Order Confirmation ORDER PROCESSED BY BLOOD BANK     CBC     Status: Abnormal   Collection Time   11/17/12  5:05 AM      Component Value Range Comment   WBC 10.7 (*) 4.0 - 10.5 K/uL    RBC 4.78  3.87 - 5.11 MIL/uL    Hemoglobin 10.3 (*) 12.0 - 15.0 g/dL    HCT 16.1 (*) 09.6 - 46.0 %    MCV 70.1 (*) 78.0 - 100.0 fL    MCH 21.5 (*) 26.0 - 34.0 pg    MCHC 30.7  30.0 - 36.0 g/dL    RDW 04.5 (*) 40.9 - 15.5 %    Platelets 384  150 - 400 K/uL   TSH     Status: Normal   Collection Time   11/17/12  5:05 AM      Component Value Range Comment   TSH 2.455  0.350 - 4.500 uIU/mL   GLUCOSE, CAPILLARY     Status: Normal   Collection Time   11/17/12  6:05 AM      Component Value Range Comment   Glucose-Capillary 83  70 - 99 mg/dL   GLUCOSE, CAPILLARY     Status: Abnormal   Collection Time   11/17/12  9:13 AM      Component Value Range Comment   Glucose-Capillary 105 (*) 70 - 99 mg/dL     US Ob Follow Up  05/29/9146  OBSTETRICAL ULTRASOUND: This exam was performed within a [facility]. The OB US report was generated in the [facility] system, and faxed to the ordering physician.   This report is also available in [employer] and in the [employer]. See [facility] Obstetric US report.     Assessment/Plan: 32 YO female with period in which she lost consciousness. From the description of event, patient had no postictal phase, no incontinence and was alert and orient after event. Likely syncopal event. Patient is on the max dose of Keppra 3000 mg daily at current time but unclear if she is compliant. She is back to her baseline and has  had no further events while hospitalized.   Recommend: 1) Continue current dose of Keppra 2) have patient follow up within 2 weeks with her primary Neurologist in [place] 3) HA treatment per primary team.   No further recommendations. Neurology will Sign off.  Plan discussed with with attending and they are in agreement.       Felicie Morn PA-C [facility] [phone]  11/17/2012, 10:35 AM   Patient seen and examined together with physician assistant and I concur with the assessment and plan.  Wyatt Portela, MD

## 2012-11-17 NOTE — Progress Notes (Signed)
Hospital day # 1 pregnancy at [redacted]w[redacted]d  S: C/O persistent headache that "nothing helps". Has tried Tylenol 3 and Dilaudid with Phenergan without relief       reports good fetal activity      Contractions:none      Vaginal bleeding:none now       Vaginal discharge: no significant change  O: BP 139/58  Pulse 108  Temp 98.2 F (36.8 C) (Oral)  Resp 22  Ht 5\' 1"  (1.549 m)  Wt 336 lb (152.409 kg)  BMI 63.49 kg/m2  SpO2 100%  LMP 03/28/2012      BP is variable: 140 160/60-90      All CBG are normal      Fetal tracings:reviewed and reassuring      Uterus gravid and non-tender      Extremities: no significant edema and no signs of DVT  A: [redacted]w[redacted]d with poor compliance to prenatal and neurology care with likely a syncopal episode ( see neuro consult)     unchanged  P: Fioricet ordered      PIH labs     Complete 24 hour urine     Await SW consult     D/C home this pm     Follow-up with [facility] neurology     Will transfer care to [facility] ( approved by Dr Marice Potter)   Silverio Lay A  MD 11/17/2012 12:05 PM

## 2012-11-17 NOTE — Progress Notes (Signed)
Pt states that she is struggling....wants to ssee social worker,  Expresses suicidial thoughts no plan

## 2012-11-17 NOTE — Progress Notes (Signed)
11/17/12  Spoke with patient today on the phone and asked patient if she was familiar with the diagnosis of GDM. Pt states she really does not know. Asked her if she had eaten prior to the 1 hr GTT taken on 10/05/12 at 2pm, and she stated she did not know. I will order an OP education referral, however medicaid may not pay for education at this point as her glucose levels are normal and her HgbA1C is 5.7 as of last Sept (("at risk for DM") and the 1 hr GTT must be followed by a 3 hr GTT to make a definitive diagnosis. Please recheck HgbA1C and consider doing a 3 hr GTT while in hospital to get a valid diagnosis in order for medicaid to pay for her OP education. Her glucose levels are excellent while in the hospital. Pt stated she would be willing to go to OP education if transportation is available.  RD has seen pt and done basic carb counting as well as meal planning, ed'l videos were ordered for pt to view. Pt stated there was nothing further I could help her with.   Her primary complaint at this time is "Im sore and hurt all over". Thank you, Lenor Coffin, RN, CNS, Diabetes Coordinator [phone])

## 2012-11-17 NOTE — Progress Notes (Signed)
Pt refused to have efm adjusted until after completion of breakfast meal

## 2012-11-18 LAB — CBC
Hemoglobin: 10.1 g/dL — ABNORMAL LOW (ref 12.0–15.0)
Platelets: 376 10*3/uL (ref 150–400)
RBC: 4.74 MIL/uL (ref 3.87–5.11)
WBC: 10.7 10*3/uL — ABNORMAL HIGH (ref 4.0–10.5)

## 2012-11-18 LAB — GLUCOSE, CAPILLARY
Glucose-Capillary: 101 mg/dL — ABNORMAL HIGH (ref 70–99)
Glucose-Capillary: 101 mg/dL — ABNORMAL HIGH (ref 70–99)
Glucose-Capillary: 112 mg/dL — ABNORMAL HIGH (ref 70–99)

## 2012-11-18 MED ORDER — PROMETHAZINE HCL 25 MG PO TABS
50.0000 mg | ORAL_TABLET | Freq: Four times a day (QID) | ORAL | Status: DC | PRN
  Administered 2012-11-18 – 2012-11-19 (×2): 50 mg via ORAL
  Filled 2012-11-18 (×2): qty 2

## 2012-11-18 NOTE — Progress Notes (Signed)
Antenatal Nutrition Assessment:  Currently  33 4/[redacted] weeks gestation, with r/o seizure activity., question of gestational diabetes Height  61" Weight 346 Lbs pre-pregnancy weight unknown.  BMI 65.5  IBW 105 Lbs  Total weight gain unknown. Weight gain goals 11-20 Lbs. Recorded weights have large shifts up and down   Estimated needs: 21-2300 kcal/day, 90-100 grams protein/day, 2.6 liters fluid/day  Gestational carbohydrate modified  diet tolerated well, appetite good. Current diet prescription will provide for increased needs.   nutrition related labs  CBG (last 3)   Basename 11/18/12 1115 11/18/12 0628 11/18/12 0022  GLUCAP 93 101* 101*   Hx of abn 1 hour GTT on 12/18, 202, lab may not have been fasting  Nutrition Dx: Increased nutrient needs r/t pregnancy and fetal growth requirements aeb [redacted] weeks gestation.  Gestational Diabetic education completed 11/17/12  Elisabeth Cara M.Odis Luster LDN Neonatal Nutrition Support Specialist Pager [phone]

## 2012-11-18 NOTE — Progress Notes (Signed)
Patient Identification:  [name] [name] Date of Evaluation:  11/18/2012  Consult requested for pt's expressed suicidal ideation   Provider;  Dr. Dois Davenport Rivard  History of Present Illness:  Poor pre-natal care; Severe Headache   Past Psychiatric History:  Pending   Past Medical History:     Past Medical History  Diagnosis Date  . Migraines     would be rx'd Percacet for relief  . Seizures 05/2011    unsure what started seizures;Neurology appt on 05/30/12;on Kepra 2000mg  bid;was on Topramax 50mg  tid  . Bipolar affective disorder     no meds currently;has been years since taking meds  . Infection     Yeast inf;not frequent  . Anemia     has taken iron supplements in the past  . Yeast infection   . H/O varicella   . Wears glasses   . Hypertension     Pt states she had pre-eclampsia w last pregnancy   . Pregnancy induced hypertension        Past Surgical History  Procedure Date  . Cesarean section   . Wisdom tooth extraction     All 4 removed  . Tonsillectomy     During high school years    Allergies: No Known Allergies  Current Medications:  Prior to Admission medications   Medication Sig Start Date End Date Taking? Authorizing Provider  acetaminophen (TYLENOL) 325 MG tablet Take 650 mg by mouth 2 (two) times daily as needed. For pain   Yes Historical Provider, MD  acetaminophen-codeine (TYLENOL #3) 300-30 MG per tablet Take 1-2 tablets by mouth every 6 (six) hours as needed for pain. 11/07/12  Yes Esmeralda Arthur, MD  Levetiracetam (KEPPRA XR) 750 MG TB24 Take 750 mg by mouth 4 (four) times daily.    Yes Historical Provider, MD  Prenatal Vit-Fe Fumarate-FA (PRENATAL MULTIVITAMIN) TABS Take 1 tablet by mouth daily.   Yes Historical Provider, MD  promethazine (PHENERGAN) 12.5 MG tablet Take 12.5 mg by mouth every 4 (four) hours as needed. nausea 06/24/12  Yes Historical Provider, MD  VITAMIN D, CHOLECALCIFEROL, PO Take 4,000 Units by mouth daily.   Yes Historical Provider, MD     Social History:    reports that she has never smoked. She has never used smokeless tobacco. She reports that she does not drink alcohol or use illicit drugs.   Family History:    Family History  Problem Relation Age of Onset  . Cancer Mother     Ovarian  . Diabetes Mother   . Hypertension Father   . Cirrhosis Father   . Hypertension Brother   . Asthma Son     Mental Status Examination/Evaluation :pending  DIAGNOSIS:   AXIS I Bipolar Disorder, r.o Suicidal Ideation  AXIS II  Deffered  AXIS III See medical notes.  AXIS IV pending  AXIS V 41-50 serious symptoms   Assessment/Plan:  Desk at [facility] contacted to report Consultation is deferred to to diversion of consultation to [facility] ED.  Requested Consult to return in am 11/19/12 RN accidentally gave pt's room tel.  Pt answers in whispers stating that she is "aching all over and her head is 'killing her' with pain".  Pt's comments are report to desk nurse.  Madeleine Fenn J. Ferol Luz, MD Psychiatrist  11/18/2012  7:16 PM

## 2012-11-18 NOTE — ED Provider Notes (Signed)
  Medical screening examination/treatment/procedure(s) were conducted as a shared visit with non-physician practitioner(s) and myself.  I personally evaluated the patient during the encounter  Please see my separate respective documentation pertaining to this patient encounter   Vida Roller, MD 11/18/12 2334

## 2012-11-18 NOTE — Progress Notes (Signed)
CSW notes social work and psychiatry consults.  CSW spoke to bedside RN to ensure that Dr. Velva Harman has been called by MD.  She states she will talk with MD to make sure.

## 2012-11-18 NOTE — Progress Notes (Signed)
Patient ID: [name] [name], female   DOB: 1981-07-08, 32 y.o.   MRN: [id number] Pt without complaints.  No leakage of fluid or VB.  Good FM Pt states she has nothing to live for.  She also has a headache BP 146/91  Pulse 94  Temp 98.2 F (36.8 C) (Oral)  Resp 20  Ht 5\' 1"  (1.549 m)  Wt 346 lb (156.945 kg)  BMI 65.38 kg/m2  SpO2 98%  LMP 03/28/2012  FHTS Baseline: 135 bpm, Variability: Good {> 6 bpm), Accelerations: Reactive and Decelerations: Absent  Toco none  Pt in NAD CV RRR Lungs CTAB abd  Gravid soft and NT GU no vb EXt no calf tenderness Results for orders placed during the hospital encounter of 11/16/12 (from the past 72 hour(s))  CBC WITH DIFFERENTIAL     Status: Abnormal   Collection Time   11/16/12  6:55 AM      Component Value Range Comment   WBC 9.8  4.0 - 10.5 K/uL    RBC 5.17 (*) 3.87 - 5.11 MIL/uL    Hemoglobin 11.2 (*) 12.0 - 15.0 g/dL    HCT 78.2  95.6 - 21.3 %    MCV 71.6 (*) 78.0 - 100.0 fL    MCH 21.7 (*) 26.0 - 34.0 pg    MCHC 30.3  30.0 - 36.0 g/dL    RDW 08.6 (*) 57.8 - 15.5 %    Platelets 350  150 - 400 K/uL    Neutrophils Relative 64  43 - 77 %    Lymphocytes Relative 29  12 - 46 %    Monocytes Relative 6  3 - 12 %    Eosinophils Relative 1  0 - 5 %    Basophils Relative 0  0 - 1 %    Neutro Abs 6.3  1.7 - 7.7 K/uL    Lymphs Abs 2.8  0.7 - 4.0 K/uL    Monocytes Absolute 0.6  0.1 - 1.0 K/uL    Eosinophils Absolute 0.1  0.0 - 0.7 K/uL    Basophils Absolute 0.0  0.0 - 0.1 K/uL    RBC Morphology POLYCHROMASIA PRESENT     BASIC METABOLIC PANEL     Status: Abnormal   Collection Time   11/16/12  6:55 AM      Component Value Range Comment   Sodium 134 (*) 135 - 145 mEq/L    Potassium 4.0  3.5 - 5.1 mEq/L    Chloride 103  96 - 112 mEq/L    CO2 18 (*) 19 - 32 mEq/L    Glucose, Bld 85  70 - 99 mg/dL    BUN 5 (*) 6 - 23 mg/dL    Creatinine, Ser 4.69 (*) 0.50 - 1.10 mg/dL    Calcium 8.8  8.4 - 62.9 mg/dL    GFR calc non Af Amer >90  >90 mL/min    GFR calc Af Amer >90  >90 mL/min   URINALYSIS, ROUTINE W REFLEX MICROSCOPIC     Status: Abnormal   Collection Time   11/16/12  7:30 AM      Component Value Range Comment   Color, Urine YELLOW  YELLOW    APPearance CLOUDY (*) CLEAR    Specific Gravity, Urine 1.015  1.005 - 1.030    pH 7.0  5.0 - 8.0    Glucose, UA NEGATIVE  NEGATIVE mg/dL    Hgb urine dipstick NEGATIVE  NEGATIVE    Bilirubin Urine NEGATIVE  NEGATIVE    Ketones,  ur NEGATIVE  NEGATIVE mg/dL    Protein, ur NEGATIVE  NEGATIVE mg/dL    Urobilinogen, UA 0.2  0.0 - 1.0 mg/dL    Nitrite NEGATIVE  NEGATIVE    Leukocytes, UA TRACE (*) NEGATIVE   URINE MICROSCOPIC-ADD ON     Status: Abnormal   Collection Time   11/16/12  7:30 AM      Component Value Range Comment   Squamous Epithelial / LPF MANY (*) RARE    WBC, UA 3-6  <3 WBC/hpf    Bacteria, UA FEW (*) RARE   URINE CULTURE     Status: Normal   Collection Time   11/16/12  7:30 AM      Component Value Range Comment   Specimen Description URINE, CLEAN CATCH      Special Requests NONE      Culture  Setup Time 11/16/2012 15:23      Colony Count 70,000 COLONIES/ML      Culture        Value: Multiple bacterial morphotypes present, none predominant. Suggest appropriate recollection if clinically indicated.   Report Status 11/17/2012 FINAL     GLUCOSE, CAPILLARY     Status: Normal   Collection Time   11/16/12 11:57 AM      Component Value Range Comment   Glucose-Capillary 90  70 - 99 mg/dL   PREPARE RBC (CROSSMATCH)     Status: Normal   Collection Time   11/16/12 12:30 PM      Component Value Range Comment   Order Confirmation ORDER PROCESSED BY BLOOD BANK     RPR     Status: Normal   Collection Time   11/16/12 12:30 PM      Component Value Range Comment   RPR NON REACTIVE  NON REACTIVE   TYPE AND SCREEN     Status: Normal (Preliminary result)   Collection Time   11/16/12  2:08 PM      Component Value Range Comment   ABO/RH(D) O POS      Antibody Screen NEG      Sample  Expiration 11/19/2012      Unit Number [id number]      Blood Component Type RED CELLS,LR      Unit division 00      Status of Unit ALLOCATED      Transfusion Status OK TO TRANSFUSE      Crossmatch Result Compatible      Unit Number [id number]      Blood Component Type RED CELLS,LR      Unit division 00      Status of Unit ALLOCATED      Transfusion Status OK TO TRANSFUSE      Crossmatch Result Compatible     ABO/RH     Status: Normal   Collection Time   11/16/12  2:10 PM      Component Value Range Comment   ABO/RH(D) O POS     GLUCOSE, CAPILLARY     Status: Abnormal   Collection Time   11/16/12  3:58 PM      Component Value Range Comment   Glucose-Capillary 104 (*) 70 - 99 mg/dL   PROTEIN, URINE, 24 HOUR     Status: Abnormal   Collection Time   11/16/12  6:00 PM      Component Value Range Comment   Urine Total Volume-UPROT 1500      Collection Interval-UPROT 24      Protein, Urine 8      Protein,  24H Urine 120 (*) 50 - 100 mg/day   CREATININE CLEARANCE, URINE, 24 HOUR     Status: Abnormal   Collection Time   11/16/12  6:00 PM      Component Value Range Comment   Urine Total Volume-CRCL 1500      Collection Interval-CRCL 24      Creatinine, Urine 131.47      Creatinine 0.76  0.50 - 1.10 mg/dL    Creatinine, 45W Ur 0981 (*) 700 - 1800 mg/day    Creatinine Clearance 180 (*) 75 - 115 mL/min   GLUCOSE, CAPILLARY     Status: Abnormal   Collection Time   11/16/12  7:41 PM      Component Value Range Comment   Glucose-Capillary 100 (*) 70 - 99 mg/dL   GLUCOSE, CAPILLARY     Status: Abnormal   Collection Time   11/16/12 10:20 PM      Component Value Range Comment   Glucose-Capillary 100 (*) 70 - 99 mg/dL   PREPARE RBC (CROSSMATCH)     Status: Normal   Collection Time   11/16/12 10:30 PM      Component Value Range Comment   Order Confirmation ORDER PROCESSED BY BLOOD BANK     CBC     Status: Abnormal   Collection Time   11/17/12  5:05 AM      Component Value Range  Comment   WBC 10.7 (*) 4.0 - 10.5 K/uL    RBC 4.78  3.87 - 5.11 MIL/uL    Hemoglobin 10.3 (*) 12.0 - 15.0 g/dL    HCT 19.1 (*) 47.8 - 46.0 %    MCV 70.1 (*) 78.0 - 100.0 fL    MCH 21.5 (*) 26.0 - 34.0 pg    MCHC 30.7  30.0 - 36.0 g/dL    RDW 29.5 (*) 62.1 - 15.5 %    Platelets 384  150 - 400 K/uL   TSH     Status: Normal   Collection Time   11/17/12  5:05 AM      Component Value Range Comment   TSH 2.455  0.350 - 4.500 uIU/mL   GLUCOSE, CAPILLARY     Status: Normal   Collection Time   11/17/12  6:05 AM      Component Value Range Comment   Glucose-Capillary 83  70 - 99 mg/dL   GLUCOSE, CAPILLARY     Status: Abnormal   Collection Time   11/17/12  9:13 AM      Component Value Range Comment   Glucose-Capillary 105 (*) 70 - 99 mg/dL   GLUCOSE, CAPILLARY     Status: Abnormal   Collection Time   11/17/12 11:27 AM      Component Value Range Comment   Glucose-Capillary 113 (*) 70 - 99 mg/dL    Comment 1 Documented in Chart      Comment 2 Notify RN     COMPREHENSIVE METABOLIC PANEL     Status: Abnormal   Collection Time   11/17/12  1:33 PM      Component Value Range Comment   Sodium 138  135 - 145 mEq/L    Potassium 3.6  3.5 - 5.1 mEq/L    Chloride 103  96 - 112 mEq/L    CO2 25  19 - 32 mEq/L    Glucose, Bld 107 (*) 70 - 99 mg/dL    BUN 9  6 - 23 mg/dL    Creatinine, Ser 3.08  0.50 - 1.10 mg/dL  Calcium 9.0  8.4 - 10.5 mg/dL    Total Protein 6.0  6.0 - 8.3 g/dL    Albumin 2.2 (*) 3.5 - 5.2 g/dL    AST 13  0 - 37 U/L    ALT 11  0 - 35 U/L    Alkaline Phosphatase 77  39 - 117 U/L    Total Bilirubin 0.1 (*) 0.3 - 1.2 mg/dL    GFR calc non Af Amer >90  >90 mL/min    GFR calc Af Amer >90  >90 mL/min   URIC ACID     Status: Normal   Collection Time   11/17/12  1:33 PM      Component Value Range Comment   Uric Acid, Serum 4.1  2.4 - 7.0 mg/dL   GLUCOSE, CAPILLARY     Status: Normal   Collection Time   11/17/12  5:26 PM      Component Value Range Comment   Glucose-Capillary 94   70 - 99 mg/dL    Comment 1 Documented in Chart      Comment 2 Notify RN     GLUCOSE, CAPILLARY     Status: Abnormal   Collection Time   11/17/12  8:10 PM      Component Value Range Comment   Glucose-Capillary 168 (*) 70 - 99 mg/dL   GLUCOSE, CAPILLARY     Status: Abnormal   Collection Time   11/18/12 12:22 AM      Component Value Range Comment   Glucose-Capillary 101 (*) 70 - 99 mg/dL   CBC     Status: Abnormal   Collection Time   11/18/12  5:10 AM      Component Value Range Comment   WBC 10.7 (*) 4.0 - 10.5 K/uL    RBC 4.74  3.87 - 5.11 MIL/uL    Hemoglobin 10.1 (*) 12.0 - 15.0 g/dL    HCT 16.1 (*) 09.6 - 46.0 %    MCV 69.6 (*) 78.0 - 100.0 fL    MCH 21.3 (*) 26.0 - 34.0 pg    MCHC 30.6  30.0 - 36.0 g/dL    RDW 04.5 (*) 40.9 - 15.5 %    Platelets 376  150 - 400 K/uL   GLUCOSE, CAPILLARY     Status: Abnormal   Collection Time   11/18/12  6:28 AM      Component Value Range Comment   Glucose-Capillary 101 (*) 70 - 99 mg/dL   GLUCOSE, CAPILLARY     Status: Normal   Collection Time   11/18/12 11:15 AM      Component Value Range Comment   Glucose-Capillary 93  70 - 99 mg/dL     Assessment and Plan [redacted]w[redacted]d  Syncopal episode H/o seizures GDM Bipolar D/O Neuro wasconsulted and saw pt they agree with our care Blood sugars over all were normal. There were a few that were elevated Pt with suicidal ideations, psych was consulted Phenergan given for headache

## 2012-11-18 NOTE — Progress Notes (Signed)
Dr Paulino Rily, pychiatrist, phoned in to say she would not be able to see and evaluate the patient today. She would leave it for the on call physician in the morning.

## 2012-11-19 ENCOUNTER — Inpatient Hospital Stay (HOSPITAL_COMMUNITY): Payer: Medicare Other

## 2012-11-19 LAB — TYPE AND SCREEN
ABO/RH(D): O POS
Unit division: 0
Unit division: 0

## 2012-11-19 LAB — CBC
HCT: 32.7 % — ABNORMAL LOW (ref 36.0–46.0)
Hemoglobin: 10 g/dL — ABNORMAL LOW (ref 12.0–15.0)
MCH: 21.5 pg — ABNORMAL LOW (ref 26.0–34.0)
MCHC: 30.6 g/dL (ref 30.0–36.0)

## 2012-11-19 LAB — GLUCOSE, CAPILLARY
Glucose-Capillary: 89 mg/dL (ref 70–99)
Glucose-Capillary: 153 mg/dL — ABNORMAL HIGH (ref 70–99)

## 2012-11-19 MED ORDER — CYCLOBENZAPRINE HCL 5 MG PO TABS
7.5000 mg | ORAL_TABLET | Freq: Three times a day (TID) | ORAL | Status: DC | PRN
  Administered 2012-11-19 – 2012-11-21 (×4): 7.5 mg via ORAL
  Filled 2012-11-19 (×4): qty 1.5

## 2012-11-19 MED ORDER — LABETALOL HCL 200 MG PO TABS
200.0000 mg | ORAL_TABLET | Freq: Three times a day (TID) | ORAL | Status: DC
  Administered 2012-11-19 – 2012-11-20 (×2): 200 mg via ORAL
  Filled 2012-11-19 (×10): qty 1

## 2012-11-19 NOTE — Clinical Social Work Note (Signed)
CSW noted psych consult on 1/31.  Per assessment/plan, psych will return a.m. 11/19/12 to complete consult.  CSW will continue to follow to assist with any d/c planning from psych recommendations.    [phone]

## 2012-11-19 NOTE — Progress Notes (Signed)
Pt given written information provided by Dr Normand Sloop on cesarean section, tubal ligation and medications. Refused to sign tubal consent at this time. Will let nurse know when she is ready.

## 2012-11-19 NOTE — Progress Notes (Signed)
Patient ID: [name] [name], female   DOB: 21-Jun-1981, 32 y.o.   MRN: [id number] Pt with complaints of body aches and headache.    No leakage of fluid or VB.  Good FM  BP 133/95  Pulse 101  Temp 97.5 F (36.4 C) (Oral)  Resp 18  Ht 5\' 1"  (1.549 m)  Wt 351 lb (159.213 kg)  BMI 66.32 kg/m2  SpO2 98%  LMP 03/28/2012  FHTS Baseline: 135 bpm, Variability: Good {> 6 bpm), Accelerations: Reactive and Decelerations: Absent  Toco none  Pt in NAD CV RRR Lungs CTAB abd  Gravid soft and NT GU no vb EXt no calf tenderness Results for orders placed during the hospital encounter of 11/16/12 (from the past 72 hour(s))  GLUCOSE, CAPILLARY     Status: Normal   Collection Time   11/16/12 11:57 AM      Component Value Range Comment   Glucose-Capillary 90  70 - 99 mg/dL   PREPARE RBC (CROSSMATCH)     Status: Normal   Collection Time   11/16/12 12:30 PM      Component Value Range Comment   Order Confirmation ORDER PROCESSED BY BLOOD BANK     RPR     Status: Normal   Collection Time   11/16/12 12:30 PM      Component Value Range Comment   RPR NON REACTIVE  NON REACTIVE   TYPE AND SCREEN     Status: Normal   Collection Time   11/16/12  2:08 PM      Component Value Range Comment   ABO/RH(D) O POS      Antibody Screen NEG      Sample Expiration 11/19/2012      Unit Number [id number]      Blood Component Type RED CELLS,LR      Unit division 00      Status of Unit REL FROM [facility]      Transfusion Status OK TO TRANSFUSE      Crossmatch Result Compatible      Unit Number [id number]      Blood Component Type RED CELLS,LR      Unit division 00      Status of Unit REL FROM [facility]      Transfusion Status OK TO TRANSFUSE      Crossmatch Result Compatible     ABO/RH     Status: Normal   Collection Time   11/16/12  2:10 PM      Component Value Range Comment   ABO/RH(D) O POS     GLUCOSE, CAPILLARY     Status: Abnormal   Collection Time   11/16/12  3:58 PM      Component Value Range Comment    Glucose-Capillary 104 (*) 70 - 99 mg/dL   PROTEIN, URINE, 24 HOUR     Status: Abnormal   Collection Time   11/16/12  6:00 PM      Component Value Range Comment   Urine Total Volume-UPROT 1500      Collection Interval-UPROT 24      Protein, Urine 8      Protein, 24H Urine 120 (*) 50 - 100 mg/day   CREATININE CLEARANCE, URINE, 24 HOUR     Status: Abnormal   Collection Time   11/16/12  6:00 PM      Component Value Range Comment   Urine Total Volume-CRCL 1500      Collection Interval-CRCL 24      Creatinine, Urine 131.47  Creatinine 0.76  0.50 - 1.10 mg/dL    Creatinine, 11B Ur 1478 (*) 700 - 1800 mg/day    Creatinine Clearance 180 (*) 75 - 115 mL/min   GLUCOSE, CAPILLARY     Status: Abnormal   Collection Time   11/16/12  7:41 PM      Component Value Range Comment   Glucose-Capillary 100 (*) 70 - 99 mg/dL   GLUCOSE, CAPILLARY     Status: Abnormal   Collection Time   11/16/12 10:20 PM      Component Value Range Comment   Glucose-Capillary 100 (*) 70 - 99 mg/dL   PREPARE RBC (CROSSMATCH)     Status: Normal   Collection Time   11/16/12 10:30 PM      Component Value Range Comment   Order Confirmation ORDER PROCESSED BY BLOOD BANK     CBC     Status: Abnormal   Collection Time   11/17/12  5:05 AM      Component Value Range Comment   WBC 10.7 (*) 4.0 - 10.5 K/uL    RBC 4.78  3.87 - 5.11 MIL/uL    Hemoglobin 10.3 (*) 12.0 - 15.0 g/dL    HCT 29.5 (*) 62.1 - 46.0 %    MCV 70.1 (*) 78.0 - 100.0 fL    MCH 21.5 (*) 26.0 - 34.0 pg    MCHC 30.7  30.0 - 36.0 g/dL    RDW 30.8 (*) 65.7 - 15.5 %    Platelets 384  150 - 400 K/uL   TSH     Status: Normal   Collection Time   11/17/12  5:05 AM      Component Value Range Comment   TSH 2.455  0.350 - 4.500 uIU/mL   GLUCOSE, CAPILLARY     Status: Normal   Collection Time   11/17/12  6:05 AM      Component Value Range Comment   Glucose-Capillary 83  70 - 99 mg/dL   GLUCOSE, CAPILLARY     Status: Abnormal   Collection Time   11/17/12  9:13 AM       Component Value Range Comment   Glucose-Capillary 105 (*) 70 - 99 mg/dL   GLUCOSE, CAPILLARY     Status: Abnormal   Collection Time   11/17/12 11:27 AM      Component Value Range Comment   Glucose-Capillary 113 (*) 70 - 99 mg/dL    Comment 1 Documented in Chart      Comment 2 Notify RN     COMPREHENSIVE METABOLIC PANEL     Status: Abnormal   Collection Time   11/17/12  1:33 PM      Component Value Range Comment   Sodium 138  135 - 145 mEq/L    Potassium 3.6  3.5 - 5.1 mEq/L    Chloride 103  96 - 112 mEq/L    CO2 25  19 - 32 mEq/L    Glucose, Bld 107 (*) 70 - 99 mg/dL    BUN 9  6 - 23 mg/dL    Creatinine, Ser 8.46  0.50 - 1.10 mg/dL    Calcium 9.0  8.4 - 96.2 mg/dL    Total Protein 6.0  6.0 - 8.3 g/dL    Albumin 2.2 (*) 3.5 - 5.2 g/dL    AST 13  0 - 37 U/L    ALT 11  0 - 35 U/L    Alkaline Phosphatase 77  39 - 117 U/L    Total Bilirubin  0.1 (*) 0.3 - 1.2 mg/dL    GFR calc non Af Amer >90  >90 mL/min    GFR calc Af Amer >90  >90 mL/min   URIC ACID     Status: Normal   Collection Time   11/17/12  1:33 PM      Component Value Range Comment   Uric Acid, Serum 4.1  2.4 - 7.0 mg/dL   GLUCOSE, CAPILLARY     Status: Normal   Collection Time   11/17/12  5:26 PM      Component Value Range Comment   Glucose-Capillary 94  70 - 99 mg/dL    Comment 1 Documented in Chart      Comment 2 Notify RN     GLUCOSE, CAPILLARY     Status: Abnormal   Collection Time   11/17/12  8:10 PM      Component Value Range Comment   Glucose-Capillary 168 (*) 70 - 99 mg/dL   GLUCOSE, CAPILLARY     Status: Abnormal   Collection Time   11/18/12 12:22 AM      Component Value Range Comment   Glucose-Capillary 101 (*) 70 - 99 mg/dL   CBC     Status: Abnormal   Collection Time   11/18/12  5:10 AM      Component Value Range Comment   WBC 10.7 (*) 4.0 - 10.5 K/uL    RBC 4.74  3.87 - 5.11 MIL/uL    Hemoglobin 10.1 (*) 12.0 - 15.0 g/dL    HCT 16.1 (*) 09.6 - 46.0 %    MCV 69.6 (*) 78.0 - 100.0 fL    MCH  21.3 (*) 26.0 - 34.0 pg    MCHC 30.6  30.0 - 36.0 g/dL    RDW 04.5 (*) 40.9 - 15.5 %    Platelets 376  150 - 400 K/uL   HEMOGLOBIN A1C     Status: Abnormal   Collection Time   11/18/12  5:10 AM      Component Value Range Comment   Hemoglobin A1C 6.2 (*) <5.7 %    Mean Plasma Glucose 131 (*) <117 mg/dL   GLUCOSE, CAPILLARY     Status: Abnormal   Collection Time   11/18/12  6:28 AM      Component Value Range Comment   Glucose-Capillary 101 (*) 70 - 99 mg/dL   GLUCOSE, CAPILLARY     Status: Normal   Collection Time   11/18/12 11:15 AM      Component Value Range Comment   Glucose-Capillary 93  70 - 99 mg/dL   GLUCOSE, CAPILLARY     Status: Abnormal   Collection Time   11/18/12  3:00 PM      Component Value Range Comment   Glucose-Capillary 112 (*) 70 - 99 mg/dL   GLUCOSE, CAPILLARY     Status: Abnormal   Collection Time   11/18/12  9:23 PM      Component Value Range Comment   Glucose-Capillary 126 (*) 70 - 99 mg/dL   CBC     Status: Abnormal   Collection Time   11/19/12  5:33 AM      Component Value Range Comment   WBC 11.4 (*) 4.0 - 10.5 K/uL    RBC 4.65  3.87 - 5.11 MIL/uL    Hemoglobin 10.0 (*) 12.0 - 15.0 g/dL    HCT 81.1 (*) 91.4 - 46.0 %    MCV 70.3 (*) 78.0 - 100.0 fL    MCH 21.5 (*)  26.0 - 34.0 pg    MCHC 30.6  30.0 - 36.0 g/dL    RDW 16.1 (*) 09.6 - 15.5 %    Platelets 347  150 - 400 K/uL   GLUCOSE, CAPILLARY     Status: Normal   Collection Time   11/19/12  6:35 AM      Component Value Range Comment   Glucose-Capillary 89  70 - 99 mg/dL     Assessment and Plan [redacted]w[redacted]d  CHTN start labetalol.  BPs are elevated.  BP taken with appropriate cuff GDM blood sugars are stable.  HGB a1c c/w DM SZ d/o continue Keppra Headache and body aches.  Will check MRI of head and give flexeril Bipolar D/O with suicidal ideations.  Awaiting psych consult

## 2012-11-19 NOTE — Consult Note (Signed)
Reason for Consult: Bipolar? Referring Physician: unknown  [name] [name] is an 32 y.o. female.  HPI:    [name] [name] is 1981/01/10 Patient's last menstrual period was 03/28/2012. [redacted]w[redacted]d. Pt was transferred from [facility] ED for a possible seizure. She said she just blacked out when she was admitted. Pt also reported SI when she came.   Seen today again. Poor historian. Reports a lot of stressors these days as this pregnancy is unplanned, not happy with the father of this baby as he is not with her most of the time, financial issues, conflict with her family and not able to talk with them. She is also taking care of her 31 year old right now. Thinks she is just stressed out these days. Not sure if she is depressed but her mood is not good these days, reports irritable mood at times and also has mood swings lasting few mins all the time. Denies any manic symptoms. Reported AH in the past but not sure. Not sure why she was diagnosed with Bipolar d/o. Thinks her Sz makes her very upset too. Reports transport issues now and it is also a stressors for her. Reports anxious at times but not sure if it is problem for her. Reports anger issues for many years. Reports randoM SI and HI thoughts with now plans and these thoughts are going for some time now. Does not feel safe going home now. rpeorts difficulty falling and staying sleep and thinks ambien not helping.   Past Medical History  Diagnosis Date  . Migraines     would be rx'd Percacet for relief  . Seizures 05/2011    unsure what started seizures;Neurology appt on 05/30/12;on Kepra 2000mg  bid;was on Topramax 50mg  tid  . Bipolar affective disorder     no meds currently;has been years since taking meds  . Infection     Yeast inf;not frequent  . Anemia     has taken iron supplements in the past  . Yeast infection   . H/O varicella   . Wears glasses   . Hypertension     Pt states she had pre-eclampsia w last pregnancy   . Pregnancy induced  hypertension    No SI attempts in the past Past Surgical History  Procedure Date  . Cesarean section   . Wisdom tooth extraction     All 4 removed  . Tonsillectomy     During high school years    Family History  Problem Relation Age of Onset  . Cancer Mother     Ovarian  . Diabetes Mother   . Hypertension Father   . Cirrhosis Father   . Hypertension Brother   . Asthma Son     Social History:  reports that she has never smoked. She has never used smokeless tobacco. She reports that she does not drink alcohol or use illicit drugs.  Allergies: No Known Allergies  Medications: I have reviewed the patient's current medications.  Results for orders placed during the hospital encounter of 11/16/12 (from the past 48 hour(s))  GLUCOSE, CAPILLARY     Status: Normal   Collection Time   11/17/12  5:26 PM      Component Value Range Comment   Glucose-Capillary 94  70 - 99 mg/dL    Comment 1 Documented in Chart      Comment 2 Notify RN     GLUCOSE, CAPILLARY     Status: Abnormal   Collection Time   11/17/12  8:10 PM  Component Value Range Comment   Glucose-Capillary 168 (*) 70 - 99 mg/dL   GLUCOSE, CAPILLARY     Status: Abnormal   Collection Time   11/18/12 12:22 AM      Component Value Range Comment   Glucose-Capillary 101 (*) 70 - 99 mg/dL   CBC     Status: Abnormal   Collection Time   11/18/12  5:10 AM      Component Value Range Comment   WBC 10.7 (*) 4.0 - 10.5 K/uL    RBC 4.74  3.87 - 5.11 MIL/uL    Hemoglobin 10.1 (*) 12.0 - 15.0 g/dL    HCT 16.1 (*) 09.6 - 46.0 %    MCV 69.6 (*) 78.0 - 100.0 fL    MCH 21.3 (*) 26.0 - 34.0 pg    MCHC 30.6  30.0 - 36.0 g/dL    RDW 04.5 (*) 40.9 - 15.5 %    Platelets 376  150 - 400 K/uL   HEMOGLOBIN A1C     Status: Abnormal   Collection Time   11/18/12  5:10 AM      Component Value Range Comment   Hemoglobin A1C 6.2 (*) <5.7 %    Mean Plasma Glucose 131 (*) <117 mg/dL   GLUCOSE, CAPILLARY     Status: Abnormal   Collection Time    11/18/12  6:28 AM      Component Value Range Comment   Glucose-Capillary 101 (*) 70 - 99 mg/dL   GLUCOSE, CAPILLARY     Status: Normal   Collection Time   11/18/12 11:15 AM      Component Value Range Comment   Glucose-Capillary 93  70 - 99 mg/dL   GLUCOSE, CAPILLARY     Status: Abnormal   Collection Time   11/18/12  3:00 PM      Component Value Range Comment   Glucose-Capillary 112 (*) 70 - 99 mg/dL   GLUCOSE, CAPILLARY     Status: Abnormal   Collection Time   11/18/12  9:23 PM      Component Value Range Comment   Glucose-Capillary 126 (*) 70 - 99 mg/dL   CBC     Status: Abnormal   Collection Time   11/19/12  5:33 AM      Component Value Range Comment   WBC 11.4 (*) 4.0 - 10.5 K/uL    RBC 4.65  3.87 - 5.11 MIL/uL    Hemoglobin 10.0 (*) 12.0 - 15.0 g/dL    HCT 81.1 (*) 91.4 - 46.0 %    MCV 70.3 (*) 78.0 - 100.0 fL    MCH 21.5 (*) 26.0 - 34.0 pg    MCHC 30.6  30.0 - 36.0 g/dL    RDW 78.2 (*) 95.6 - 15.5 %    Platelets 347  150 - 400 K/uL   GLUCOSE, CAPILLARY     Status: Normal   Collection Time   11/19/12  6:35 AM      Component Value Range Comment   Glucose-Capillary 89  70 - 99 mg/dL   GLUCOSE, CAPILLARY     Status: Abnormal   Collection Time   11/19/12 11:03 AM      Component Value Range Comment   Glucose-Capillary 110 (*) 70 - 99 mg/dL     No results found.  ROS Blood pressure 131/65, pulse 99, temperature 98.1 F (36.7 C), temperature source Oral, resp. rate 18, height 5\' 1"  (1.549 m), weight 159.213 kg (351 lb), last menstrual period 03/28/2012, SpO2 98.00%. Physical  Exam Mental Status Examination/Evaluation:  Appearance: on bed   Eye Contact:: poor  Speech: normal   Volume: Normal   Mood: depressed   Affect: ristricted  Thought Process: organized   Orientation: Full   Thought Content: NO AVH  Suicidal Thoughts: None now. No plans  Homicidal Thoughts: none now  Memory: Recent; Poor   Judgement: Impaired   Insight: Lacking   Psychomotor Activity: Normal    Concentration: Fair   Recall: Fair   Akathisia: No    Assessment:  AXIS I: adjustment d/o with mixed emotions, r/o Depressive d/o nos, Hx of Bipolar d/o AXIS II: Deferred  AXIS III: see emdical hx ?  ? ?  ? ? ?  ? ? ?  ? ? ?  ? ? ?  AXIS IV: problems related to social environment, problems with access to health care services and problems with primary support group, financial issues AXIS V: 35 ? Treatment Plan/Recommendations:   1. will recommend to consider SSRI like Prozac starting 10 mg QAM for mood, pt was told about its risks vs benefits including Category C drug  2. Consider Trazodone (50 -100 mg QHS) instead of Ambien. Pt thinks Ambien not helping. Pt knows all are Cat C  3. Will not recommend to discharge at this time  4. SW can help with post discharge planning including transport issues and psy out pt follow up including therapy  5. Will follow tomorrow. Pt may need 1;1 in the future. Currently she denies SI    Wonda Cerise 11/19/2012, 3:09 PM

## 2012-11-19 NOTE — Progress Notes (Signed)
esarean delivery is the birth of a baby through a cut (incision) in the abdomen and womb (uterus).  LET YOUR CAREGIVER KNOW ABOUT:  Complicationsinvolving the pregnancy.  Allergies.  Medicines taken including herbs, eyedrops, over-the-counter medicines, and creams.  Use of steroids (by mouth or creams).  Previous problems with anesthetics or numbing medicine.  Previous surgery.  History of blood clots.  History of bleeding or blood problems.  Other health problems. RISKS AND COMPLICATIONS   Bleeding.  Infection.  Blood clots.  Injury to surrounding organs.  Anesthesia problems.  Injury to the baby. BEFORE THE PROCEDURE   A tube (Foley catheter) will be placed in your bladder. The Foley catheter drains the urine from your bladder into a bag. This keeps your bladder empty during surgery.  An intravenous access tube (IV) will be placed in your arm.  Hair may be removed from your pubic area and your lower abdomen. This is to prevent infection in the incision site.  You may be given an antacid medicine to drink. This will prevent acid contents in your stomach from going into your lungs if you vomit during the surgery.  You may be given an antibiotic medicine to prevent infection. PROCEDURE   You may be given medicine to numb the lower half of your body (regional anesthetic). If you were in labor, you may have already had an epidural in place which can be used in both labor and cesarean delivery. You may possibly be given medicine to make you sleep (general anesthetic) though this is not as common.  An incision will be made in your abdomen that extends to your uterus. There are 2 basic kinds of incisions:  The horizontal (transverse) incision. Horizontal incisions are used for most routine cesarean deliveries.  The vertical (up and down) incision. This is less commonly used. This is most often reserved for women who have a serious complication (extreme prematurity) or  under emergency situations.  The horizontal and vertical incisions may both be used at the same time. However, this is very uncommon.  Your baby will then be delivered. AFTER THE PROCEDURE   If you were awake during the surgery, you will see your baby right away. If you were asleep, you will see your baby as soon as you are awake.  You may breastfeed your baby after surgery.  You may be able to get up and walk the same day as the surgery. If you need to stay in bed for a period of time, you will receive help to turn, cough, and take deep breaths after surgery. This helps prevent lung problems such as pneumonia.  Do not get out of bed alone the first time after surgery. You will need help getting out of bed until you are able to do this by yourself.  You may be able to shower the day after your cesarean delivery. After the bandage (dressing) is taken off the incision site, a nurse will assist you to shower, if you like.  You will have pneumatic compressing hose placed on your feet or lower legs. These hose are used to prevent blood clots. When you are up and walking regularly, they will no longer be necessary.  Do not cross your legs when you sit.  Save any blood clots that you pass. If you pass a clot while on the toilet, do not flush it. Call for the nurse. Tell the nurse if you think you are bleeding too much or passing too many clots.  Start  drinking liquids and eating food as directed by your caregiver. If your stomach is not ready, drinking and eating too soon can cause an increase in bloating and swelling of your intestine and abdomen. This is very uncomfortable.  You will be given medicine as needed. Let your caregivers know if you are hurting. They want you to be comfortable. You may also be given an antibiotic to prevent an infection.  Your IV will be taken out when you are drinking a reasonable amount of fluids. The Foley catheter is taken out when you are up and  walking.  If your blood type is Rh negative and your baby's blood type is Rh positive, you will be given a shot of anti-D immune globulin. This shot prevents you from having Rh problems with a future pregnancy. You should get the shot even if you had your tubes tied (tubal ligation).  If you are allowed to take the baby for a walk, place the baby in the bassinet and push it. Do not carry your baby in your arms. Document Released: 10/05/2005 Document Revised: 12/28/2011 Document Reviewed: 01/30/2011 [facility] Patient Information 2013 [place], [place]. Sterilization Information, Female Female sterilization is a procedure to permanently prevent pregnancy. There are different ways to perform sterilization, but all either block or close the fallopian tubes so that your eggs cannot reach your uterus. If your egg cannot reach your uterus, sperm cannot fertilize the egg, and you cannot get pregnant.  Sterilization is performed by a surgical procedure. Sometimes these procedures are performed in a hospital while a patient is asleep. Sometimes they can be done in a clinic setting with the patient awake. The fallopian tubes can be surgically cut, tied, or sealed through a procedure called tubal ligation. The fallopian tubes can also be closed with clips or rings. Sterilization can also be done by placing a tiny coil into each fallopian tube, which causes scar tissue to grow inside the tube. The scar tissue then blocks the tubes.  Discuss sterilization with your caregiver to answer any concerns you or your partner may have. You may want to ask what type of sterilization your caregiver performs. Some caregivers may not perform all the various options. Sterilization is permanent and should only be done if you are sure you do not want children or do not want any more children. Having a sterilization reversed may not be successful.  STERILIZATION PROCEDURES  Laparoscopic sterilization. This is a surgical method performed  at a time other than right after childbirth. Two incisions are made in the lower abdomen. A thin, lighted tube (laparoscope) is inserted into one of the incisions and is used to perform the procedure. The fallopian tubes are closed with a ring or a clip. An instrument that uses heat could be used to seal the tubes closed (electrocautery).   Mini-laparotomy. This is a surgical method done 1 or 2 days after giving birth. Typically, a small incision is made just below the belly button (umbilicus) and the fallopian tubes are exposed. The tubes can then be sealed, tied, or cut.   Hysteroscopic sterilization. This is performed at a time other than right after childbirth. A tiny, spring-like coil is inserted through the cervix and uterus and placed into the fallopian tubes. The coil causes scaring and blocks the tubes. Other forms of contraception should be used for 3 months after the procedure to allow the scar tissue to form completely. Additionally, it is required hysterosalpingography be done 3 months later to ensure that the  procedure was successful. Hysterosalpingography is a procedure that uses X-rays to look at your uterus and fallopian tubes after a material to make them show up better has been inserted. IS STERILIZATION SAFE? Sterilization is considered safe with very rare complications. Risks depend on the type of procedure you have. As with any surgical procedure, there are risks. Some risks of sterilization by any means include:   Bleeding.  Infection.  Reaction to anesthesia medicine.  Injury to surrounding organs. Risks specific to having hysteroscopic coils placed include:  The coils may not be placed correctly the first time.   The coils may move out of place.   The tubes may not get completely blocked after 3 months.   Injury to surrounding organs when placing the coil.  HOW EFFECTIVE IS FEMALE STERILIZATION? Sterilization is nearly 100% effective, but it can fail.  Depending on the type of sterilization, the rate of failure can be as high as 3%. After hysteroscopic sterilization with placement of fallopian tube coils, you will need back-up birth control for 3 months after the procedure. Sterilization is effective for a lifetime.  BENEFITS OF STERILIZATION  It does not affect your hormones, and therefore will not affect your menstrual periods, sexual desire, or performance.   It is effective for a lifetime.   It is safe.   You do not need to worry about getting pregnant. Keep in mind that if you had the hysteroscopic placement procedure, you must wait 3 months after the procedure (or until your caregiver confirms) before pregnancy is not considered possible.   There are no side effects unlike other types of birth control (contraception).  DRAWBACKS OF STERILIZATION  You must be sure you do not want children or any more children. The procedure is permanent.   It does not provide protection against sexually transmitted infections (STIs).   The tubes can grow back together. If this happens, there is a risk of pregnancy. There is also an increased risk (50%) of pregnancy being an ectopic pregnancy. This is a pregnancy that happens outside of the uterus. Document Released: 03/23/2008 Document Revised: 04/05/2012 Document Reviewed: 01/21/2012 [facility] Patient Information 2013 [place], [place]. Fluoxetine capsules or tablets (Depression/Mood Disorders) What is this medicine? FLUOXETINE (floo OX e teen) belongs to a class of drugs known as selective serotonin reuptake inhibitors (SSRIs). It helps to treat mood problems such as depression, obsessive compulsive disorder, and panic attacks. It can also treat certain eating disorders. This medicine may be used for other purposes; ask your health care provider or pharmacist if you have questions. What should I tell my health care provider before I take this medicine? They need to know if you have any of these  conditions: -bipolar disorder or mania -diabetes -glaucoma -liver disease -psychosis -seizures -suicidal thoughts or history of attempted suicide -an unusual or allergic reaction to fluoxetine, other medicines, foods, dyes, or preservatives -pregnant or trying to get pregnant -breast-feeding How should I use this medicine? Take this medicine by mouth with a glass of water. Follow the directions on the prescription label. You can take this medicine with or without food. Take your medicine at regular intervals. Do not take it more often than directed. Do not stop taking this medicine suddenly except upon the advice of your doctor. Stopping this medicine too quickly may cause serious side effects or your condition may worsen. A special MedGuide will be given to you by the pharmacist with each prescription and refill. Be sure to read this information carefully each  time. Talk to your pediatrician regarding the use of this medicine in children. While this drug may be prescribed for children as young as 7 years for selected conditions, precautions do apply. Overdosage: If you think you have taken too much of this medicine contact a poison control center or emergency room at once. NOTE: This medicine is only for you. Do not share this medicine with others. What if I miss a dose? If you miss a dose, skip the missed dose and go back to your regular dosing schedule. Do not take double or extra doses. What may interact with this medicine? Do not take fluoxetine with any of the following medications: -other medicines containing fluoxetine, like Sarafem or Symbyax -cisapride -linezolid -MAOIs like Carbex, Eldepryl, Marplan, Nardil, and Parnate -methylene blue (injected into a vein) -pimozide -thioridazine This medicine may also interact with the following medications: -alcohol -aspirin and aspirin-like medicines -carbamazepine -certain medicines for depression, anxiety, or psychotic  disturbances -certain medicines for migraine headaches like almotriptan, eletriptan, frovatriptan, naratriptan, rizatriptan, sumatriptan, zolmitriptan -digoxin -diuretics -fentanyl -flecainide -furazolidone -isoniazid -lithium -medicines for sleep -medicines that treat or prevent blood clots like warfarin, enoxaparin, and dalteparin -NSAIDs, medicines for pain and inflammation, like ibuprofen or naproxen -phenytoin -procarbazine -propafenone -rasagiline -ritonavir -supplements like St. John's wort, kava kava, valerian -tramadol -tryptophan -vinblastine This list may not describe all possible interactions. Give your health care provider a list of all the medicines, herbs, non-prescription drugs, or dietary supplements you use. Also tell them if you smoke, drink alcohol, or use illegal drugs. Some items may interact with your medicine. What should I watch for while using this medicine? Tell your doctor if your symptoms do not get better or if they get worse. Visit your doctor or health care professional for regular checks on your progress. Because it may take several weeks to see the full effects of this medicine, it is important to continue your treatment as prescribed by your doctor. Patients and their families should watch out for new or worsening thoughts of suicide or depression. Also watch out for sudden changes in feelings such as feeling anxious, agitated, panicky, irritable, hostile, aggressive, impulsive, severely restless, overly excited and hyperactive, or not being able to sleep. If this happens, especially at the beginning of treatment or after a change in dose, call your health care professional. Bonita Quin may get drowsy or dizzy. Do not drive, use machinery, or do anything that needs mental alertness until you know how this medicine affects you. Do not stand or sit up quickly, especially if you are an older patient. This reduces the risk of dizzy or fainting spells. Alcohol may  interfere with the effect of this medicine. Avoid alcoholic drinks. Your mouth may get dry. Chewing sugarless gum or sucking hard candy, and drinking plenty of water may help. Contact your doctor if the problem does not go away or is severe. This medicine may affect blood sugar levels. If you have diabetes, check with your doctor or health care professional before you change your diet or the dose of your diabetic medicine. What side effects may I notice from receiving this medicine? Side effects that you should report to your doctor or health care professional as soon as possible: -allergic reactions like skin rash, itching or hives, swelling of the face, lips, or tongue -breathing problems -confusion -fast or irregular heart rate, palpitations -flu-like fever, chills, cough, muscle or joint aches and pains -seizures -suicidal thoughts or other mood changes -tremors -trouble sleeping -unusual bleeding or bruising -  unusually tired or weak -vomiting Side effects that usually do not require medical attention (report to your doctor or health care professional if they continue or are bothersome): -blurred vision -change in sex drive or performance -diarrhea -dry mouth -flushing -headache -increased or decreased appetite -nausea -sweating This list may not describe all possible side effects. Call your doctor for medical advice about side effects. You may report side effects to FDA at 1-800-FDA-1088. Where should I keep my medicine? Keep out of the reach of children. Store at room temperature between 15 and 30 degrees C (59 and 86 degrees F). Throw away any unused medicine after the expiration date. NOTE: This sheet is a summary. It may not cover all possible information. If you have questions about this medicine, talk to your doctor, pharmacist, or health care provider.  2013, [employer]. (02/19/2012 7:31:49 PM) Hypertension During Pregnancy Hypertension is also called high  blood pressure. It can occur at any time in life and during pregnancy. When you have hypertension, there is extra pressure inside your blood vessels that carry blood from the heart to the rest of your body (arteries). Hypertension during pregnancy can cause problems for you and your baby. Your baby might not weigh as much as it should at birth or might be born early (premature). Very bad cases of hypertension during pregnancy can be life-threatening.  There are different types of hypertension during pregnancy.   Chronic hypertension. This happens when a woman has hypertension before pregnancy and it continues during pregnancy.  Gestational hypertension. This is when hypertension develops during pregnancy.  Preeclampsia or toxemia of pregnancy. This is a very serious type of hypertension that develops only during pregnancy. It is a disease that affects the whole body (systemic) and can be very dangerous for both mother and baby.  Gestational hypertension and preeclampsia usually go away after your baby is born. Blood pressure generally stabilizes within 6 weeks. Women who have hypertension during pregnancy have a greater chance of developing hypertension later in life or with future pregnancies. UNDERSTANDING BLOOD PRESSURE Blood pressure moves blood in your body. Sometimes, the force that moves the blood becomes too strong.  A blood pressure reading is given in 2 numbers and looks like a fraction.  The top number is called the systolic pressure. When your heart beats, it forces more blood to flow through the arteries. Pressure inside the arteries goes up.  The bottom number is the diastolic pressure. Pressure goes down between beats. That is when the heart is resting.  You may have hypertension if:  Your systolic blood pressure is above 140.  Your diastolic pressure is above 90. RISK FACTORS Some factors make you more likely to develop hypertension during pregnancy. Risk factors  include:  Having hypertension before pregnancy.  Having hypertension during a previous pregnancy.  Being overweight.  Being older than 40.  Being pregnant with more than 1 baby (multiples).  Having diabetes or kidney problems. SYMPTOMS Chronic and gestational hypertension may not cause symptoms. Preeclampsia has symptoms, which may include:  Increased protein in your urine. Your caregiver will check for this at every prenatal visit.  Swelling of your hands and face.  Rapid weight gain.  Headaches.  Visual changes.  Being bothered by light.  Abdominal pain, especially in the right upper area.  Chest pain.  Shortness of breath.  Increased reflexes.  Seizures. Seizures occur with a more severe form of preeclampsia, called eclampsia. DIAGNOSIS   You may be diagnosed with hypertension during pregnancy  during a regular prenatal exam. At each visit, tests may include:  Blood pressure checks.  A urine test to check for protein in your urine.  The type of hypertension you are diagnosed with depends on when you developed it. It also depends on your specific blood pressure reading.  Developing hypertension before 20 weeks of pregnancy is consistent with chronic hypertension.  Developing hypertension after 20 weeks of pregnancy is consistent with gestational hypertension.  Hypertension with increased urinary protein is diagnosed as preeclampsia.  Blood pressure measurements that stay above 160 systolic or 110 diastolic are a sign of severe preeclampsia. TREATMENT Treatment for hypertension during pregnancy varies. Treatment depends on the type of hypertension and how serious it is.  If you take medicine for chronic hypertension, you may need to switch medicines.  Drugs called ACE inhibitors should not be taken during pregnancy.  Low-dose aspirin may be suggested for women who have risk factors for preeclampsia.  If you have gestational hypertension, you may need to  take a blood pressure medicine that is safe during pregnancy. Your caregiver will recommend the appropriate medicine.  If you have severe preeclampsia, you may need to be in the hospital. Caregivers will watch you and the baby very closely. You also may need to take medicine (magnesium sulfate) to prevent seizures and lower blood pressure.  Sometimes an early delivery is needed. This may be the case if the condition worsens. It would be done to protect you and the baby. The only cure for preeclampsia is delivery. HOME CARE INSTRUCTIONS  Schedule and keep all of your regular prenatal care.  Follow your caregiver's instructions for taking medicines. Tell your caregiver about all medicines you take. This includes over-the-counter medicines.  Eat as little salt as possible.  Get regular exercise.  Do not drink alcohol.  Do not use tobacco products.  Do not drink products with caffeine.  Lie on your left side when resting.  Tell your doctor if you have any preeclampsia symptoms. SEEK IMMEDIATE MEDICAL CARE IF:  You have severe abdominal pain.  You have sudden swelling in the hands, ankles, or face.  You gain 4 pounds (1.8 kg) or more in 1 week.  You vomit repeatedly.  You have vaginal bleeding.  You do not feel the baby moving as much.  You have a headache.  You have blurred or double vision.  You have muscle twitching or spasms.  You have shortness of breath.  You have blue fingernails and lips.  You have blood in your urine. MAKE SURE YOU:  Understand these instructions.  Will watch your condition.  Will get help right away if you are not doing well. Document Released: 06/23/2011 Document Revised: 12/28/2011 Document Reviewed: 06/23/2011 [facility] Patient Information 2013 [place], [place]. Gestational Diabetes Mellitus Gestational diabetes mellitus (GDM) is diabetes that occurs only during pregnancy. This happens when the body cannot properly handle the glucose  (sugar) that increases in the blood after eating. During pregnancy, insulin resistance (reduced sensitivity to insulin) occurs because of the release of hormones from the placenta. Usually, the pancreas of pregnant women produces enough insulin to overcome the resistance that occurs. However, in gestational diabetes, the insulin is there but it does not work effectively. If the resistance is severe enough that the pancreas does not produce enough insulin, extra glucose builds up in the blood.  WHO IS AT RISK FOR DEVELOPING GESTATIONAL DIABETES?  Women with a history of diabetes in the family.  Women over age 12.  Women  who are overweight.  Women in certain ethnic groups (Hispanic, African American, Native American, [place] and [place]er). WHAT CAN HAPPEN TO THE BABY? If the mother's blood glucose is too high while she is pregnant, the extra sugar will travel through the umbilical cord to the baby. Some of the problems the baby may have are:  Large Baby - If the baby receives too much sugar, the baby will gain more weight. This may cause the baby to be too large to be born normally (vaginally) and a Cesarean section (C-section) may be needed.  Low Blood Glucose (hypoglycemia)  The baby makes extra insulin, in response to the extra sugar its gets from its mother. When the baby is born and no longer needs this extra insulin, the baby's blood glucose level may drop.  Jaundice (yellow coloring of the skin and eyes)  This is fairly common in babies. It is caused from a build-up of the chemical called bilirubin. This is rarely serious, but is seen more often in babies whose mothers had gestational diabetes. RISKS TO THE MOTHER Women who have had gestational diabetes may be at higher risk for some problems, including:  Preeclampsia or toxemia, which includes problems with high blood pressure. Blood pressure and protein levels in the urine must be checked frequently.  Infections.  Cesarean  section (C-section) for delivery.  Developing Type 2 diabetes later in life. About 30-50% will develop diabetes later, especially if obese. DIAGNOSIS  The hormones that cause insulin resistance are highest at about 24-28 weeks of pregnancy. If symptoms are experienced, they are much like symptoms you would normally expect during pregnancy.  GDM is often diagnosed using a two part method: 1. After 24-28 weeks of pregnancy, the woman drinks a glucose solution and takes a blood test. If the glucose level is high, a second test will be given. 2. Oral Glucose Tolerance Test (OGTT) which is 3 hours long  After not eating overnight, the blood glucose is checked. The woman drinks a glucose solution, and hourly blood glucose tests are taken. If the woman has risk factors for GDM, the caregiver may test earlier than 24 weeks of pregnancy. TREATMENT  Treatment of GDM is directed at keeping the mother's blood glucose level normal, and may include:  Meal planning.  Taking insulin or other medicine to control your blood glucose level.  Exercise.  Keeping a daily record of the foods you eat.  Blood glucose monitoring and keeping a record of your blood glucose levels.  May monitor ketone levels in the urine, although this is no longer considered necessary in most pregnancies. HOME CARE INSTRUCTIONS  While you are pregnant:  Follow your caregiver's advice regarding your prenatal appointments, meal planning, exercise, medicines, vitamins, blood and other tests, and physical activities.  Keep a record of your meals, blood glucose tests, and the amount of insulin you are taking (if any). Show this to your caregiver at every prenatal visit.  If you have GDM, you may have problems with hypoglycemia (low blood glucose). You may suspect this if you become suddenly dizzy, feel shaky, and/or weak. If you think this is happening and you have a glucose meter, try to test your blood glucose level. Follow your  caregiver's advice for when and how to treat your low blood glucose. Generally, the 15:15 rule is followed: Treat by consuming 15 grams of carbohydrates, wait 15 minutes, and recheck blood glucose. Examples of 15 grams of carbohydrates are:  1 cup skim or low-fat milk.  cup juice.  3-4 glucose tablets.  5-6 hard candies.  1 small box raisins.   cup regular soda pop.  Practice good hygiene, to avoid infections.  Do not smoke. SEEK MEDICAL CARE IF:   You develop abnormal vaginal discharge, with or without itching.  You become weak and tired more than expected.  You seem to sweat a lot.  You have a sudden increase in weight, 5 pounds or more in one week.  You are losing weight, 3 pounds or more in a week.  Your blood glucose level is high, and you need instructions on what to do about it. SEEK IMMEDIATE MEDICAL CARE IF:   You develop a severe headache.  You faint or pass out.  You develop nausea and vomiting.  You become disoriented or confused.  You have a convulsion.  You develop vision problems.  You develop stomach pain.  You develop vaginal bleeding.  You develop uterine contractions.  You have leaking or a gush of fluid from the vagina. AFTER YOU HAVE THE BABY:  Go to all of your follow-up appointments, and have blood tests as advised by your caregiver.  Maintain a healthy lifestyle, to prevent diabetes in the future. This includes:  Following a healthy meal plan.  Controlling your weight.  Getting enough exercise and proper rest.  Do not smoke.  Breastfeed your baby if you can. This will lower the chance of you and your baby developing diabetes later in life. For more information about diabetes, go to the American Diabetes Association at: PMFashions.com.cy. For more information about gestational diabetes, go to the [employer] at: RentRule.com.au. Document Released: 01/11/2001 Document  Revised: 12/28/2011 Document Reviewed: 08/05/2009 [facility] Patient Information 2013 , [place].

## 2012-11-19 NOTE — Progress Notes (Signed)
Patient ID: [name] [name], female   DOB: 12-16-1980, 31 y.o.   MRN: [id number] Pt still c/o body aches and headache BP 126/59  Pulse 88  Temp 98 F (36.7 C) (Oral)  Resp 20  Ht 5\' 1"  (1.549 m)  Wt 351 lb (159.213 kg)  BMI 66.32 kg/m2  SpO2 98%  LMP 03/28/2012 Head CT is negative Pt seen by psychiatrist, he recommends prozac and trazadone for sleep.  Both are a category C.  R& B reviewed with the pt.  She desires to read about it.  She also wants a BTL with cesarean.  Pt given tubal papers to be signed Pt given verbal and written information on Cesarean with BTL prozac Depression HTN and GDM in pregnancy

## 2012-11-20 DIAGNOSIS — F4323 Adjustment disorder with mixed anxiety and depressed mood: Secondary | ICD-10-CM

## 2012-11-20 LAB — CBC
Hemoglobin: 9.8 g/dL — ABNORMAL LOW (ref 12.0–15.0)
MCH: 22 pg — ABNORMAL LOW (ref 26.0–34.0)
RBC: 4.46 MIL/uL (ref 3.87–5.11)

## 2012-11-20 LAB — GLUCOSE, CAPILLARY: Glucose-Capillary: 100 mg/dL — ABNORMAL HIGH (ref 70–99)

## 2012-11-20 NOTE — Progress Notes (Signed)
Patient states she is still in pain, "back, shoulders, legs, feet, abdomen, head, everywhere".  States the medication is not working and never has worked. She was able to sleep after ambien.

## 2012-11-20 NOTE — Progress Notes (Signed)
Patient ID: [name] [name], female   DOB: 1981-05-01, 32 y.o.   MRN: [id number] Pt without complaints.  No leakage of fluid or VB.  Good FM  BP 139/64  Pulse 97  Temp 98.8 F (37.1 C) (Oral)  Resp 20  Ht 5\' 1"  (1.549 m)  Wt 351 lb 3.2 oz (159.303 kg)  BMI 66.36 kg/m2  SpO2 96%  LMP 03/28/2012  FHTS Baseline: 135 bpm, Variability: Good {> 6 bpm), Accelerations: are present.  there is some drop out and Decelerations: Absent  Toco none  Pt in NAD CV RRR Lungs CTAB abd  Gravid soft and NT GU no vb EXt no calf tenderness Results for orders placed during the hospital encounter of 11/16/12 (from the past 72 hour(s))  COMPREHENSIVE METABOLIC PANEL     Status: Abnormal   Collection Time   11/17/12  1:33 PM      Component Value Range Comment   Sodium 138  135 - 145 mEq/L    Potassium 3.6  3.5 - 5.1 mEq/L    Chloride 103  96 - 112 mEq/L    CO2 25  19 - 32 mEq/L    Glucose, Bld 107 (*) 70 - 99 mg/dL    BUN 9  6 - 23 mg/dL    Creatinine, Ser 4.09  0.50 - 1.10 mg/dL    Calcium 9.0  8.4 - 81.1 mg/dL    Total Protein 6.0  6.0 - 8.3 g/dL    Albumin 2.2 (*) 3.5 - 5.2 g/dL    AST 13  0 - 37 U/L    ALT 11  0 - 35 U/L    Alkaline Phosphatase 77  39 - 117 U/L    Total Bilirubin 0.1 (*) 0.3 - 1.2 mg/dL    GFR calc non Af Amer >90  >90 mL/min    GFR calc Af Amer >90  >90 mL/min   URIC ACID     Status: Normal   Collection Time   11/17/12  1:33 PM      Component Value Range Comment   Uric Acid, Serum 4.1  2.4 - 7.0 mg/dL   GLUCOSE, CAPILLARY     Status: Normal   Collection Time   11/17/12  5:26 PM      Component Value Range Comment   Glucose-Capillary 94  70 - 99 mg/dL    Comment 1 Documented in Chart      Comment 2 Notify RN     GLUCOSE, CAPILLARY     Status: Abnormal   Collection Time   11/17/12  8:10 PM      Component Value Range Comment   Glucose-Capillary 168 (*) 70 - 99 mg/dL   GLUCOSE, CAPILLARY     Status: Abnormal   Collection Time   11/18/12 12:22 AM      Component Value  Range Comment   Glucose-Capillary 101 (*) 70 - 99 mg/dL   CBC     Status: Abnormal   Collection Time   11/18/12  5:10 AM      Component Value Range Comment   WBC 10.7 (*) 4.0 - 10.5 K/uL    RBC 4.74  3.87 - 5.11 MIL/uL    Hemoglobin 10.1 (*) 12.0 - 15.0 g/dL    HCT 91.4 (*) 78.2 - 46.0 %    MCV 69.6 (*) 78.0 - 100.0 fL    MCH 21.3 (*) 26.0 - 34.0 pg    MCHC 30.6  30.0 - 36.0 g/dL    RDW 95.6 (*)  11.5 - 15.5 %    Platelets 376  150 - 400 K/uL   HEMOGLOBIN A1C     Status: Abnormal   Collection Time   11/18/12  5:10 AM      Component Value Range Comment   Hemoglobin A1C 6.2 (*) <5.7 %    Mean Plasma Glucose 131 (*) <117 mg/dL   GLUCOSE, CAPILLARY     Status: Abnormal   Collection Time   11/18/12  6:28 AM      Component Value Range Comment   Glucose-Capillary 101 (*) 70 - 99 mg/dL   GLUCOSE, CAPILLARY     Status: Normal   Collection Time   11/18/12 11:15 AM      Component Value Range Comment   Glucose-Capillary 93  70 - 99 mg/dL   GLUCOSE, CAPILLARY     Status: Abnormal   Collection Time   11/18/12  3:00 PM      Component Value Range Comment   Glucose-Capillary 112 (*) 70 - 99 mg/dL   GLUCOSE, CAPILLARY     Status: Abnormal   Collection Time   11/18/12  9:23 PM      Component Value Range Comment   Glucose-Capillary 126 (*) 70 - 99 mg/dL   CBC     Status: Abnormal   Collection Time   11/19/12  5:33 AM      Component Value Range Comment   WBC 11.4 (*) 4.0 - 10.5 K/uL    RBC 4.65  3.87 - 5.11 MIL/uL    Hemoglobin 10.0 (*) 12.0 - 15.0 g/dL    HCT 14.7 (*) 82.9 - 46.0 %    MCV 70.3 (*) 78.0 - 100.0 fL    MCH 21.5 (*) 26.0 - 34.0 pg    MCHC 30.6  30.0 - 36.0 g/dL    RDW 56.2 (*) 13.0 - 15.5 %    Platelets 347  150 - 400 K/uL   GLUCOSE, CAPILLARY     Status: Normal   Collection Time   11/19/12  6:35 AM      Component Value Range Comment   Glucose-Capillary 89  70 - 99 mg/dL   GLUCOSE, CAPILLARY     Status: Abnormal   Collection Time   11/19/12 11:03 AM      Component Value  Range Comment   Glucose-Capillary 110 (*) 70 - 99 mg/dL   GLUCOSE, CAPILLARY     Status: Abnormal   Collection Time   11/19/12  8:06 PM      Component Value Range Comment   Glucose-Capillary 153 (*) 70 - 99 mg/dL   GLUCOSE, CAPILLARY     Status: Abnormal   Collection Time   11/19/12  9:59 PM      Component Value Range Comment   Glucose-Capillary 121 (*) 70 - 99 mg/dL   CBC     Status: Abnormal   Collection Time   11/20/12  5:35 AM      Component Value Range Comment   WBC 9.9  4.0 - 10.5 K/uL    RBC 4.46  3.87 - 5.11 MIL/uL    Hemoglobin 9.8 (*) 12.0 - 15.0 g/dL    HCT 86.5 (*) 78.4 - 46.0 %    MCV 67.7 (*) 78.0 - 100.0 fL    MCH 22.0 (*) 26.0 - 34.0 pg    MCHC 32.5  30.0 - 36.0 g/dL    RDW 69.6 (*) 29.5 - 15.5 %    Platelets 418 (*) 150 - 400 K/uL  GLUCOSE, CAPILLARY     Status: Normal   Collection Time   11/20/12  8:07 AM      Component Value Range Comment   Glucose-Capillary 88  70 - 99 mg/dL    Comment 1 Documented in Chart     GLUCOSE, CAPILLARY     Status: Abnormal   Collection Time   11/20/12 10:26 AM      Component Value Range Comment   Glucose-Capillary 100 (*) 70 - 99 mg/dL    Comment 1 Documented in Chart       Assessment and Plan [redacted]w[redacted]d  Bipolar Disorder pt is declining prozac and trazadone.  She is scared to go home because she lives alone and has no support.  She feels if she has a seizure she may not have help.  SW was consulted to see if she is eligible for life alert CHTN controlled on labetalol GDM blood sugars are  Controlled on diet Seizures controlled on Keppra.  Head CT neg Pt can be discharged today.  She does not want to go home.  I am waiting for social work to assist with discharge planning and help to get pt the support she needs.

## 2012-11-20 NOTE — Progress Notes (Signed)
CSW spoke with pt about recommendations from psych and discharge plans.  Pt states she does not feel comfortable discharging until she has life alert set up in her home.  CSW contacted company and is only available mon-Friday to discuss set up.  Pt wants to remain in hospital until she is able to have this set up tomorrow (monday).  CSW provided pt with phone number for set up so she can call as early as 8am tomorrow.  CSW also discussed counseling services and pt does not want CSW to give her this until she is ready to discharge tomorrow.  CSW will let weekday CSW know about d/c plans.   [phone]

## 2012-11-20 NOTE — Consult Note (Signed)
Reason for Consult: Bipolar? Referring Physician: unknown  [name] [name] is an 32 y.o. female.    Interval Hx:  Seen today. She was not interested to talk much. Per pt she is not interested to consider any psy meds now. Per pt she wants to rest know. Reported no thoughts to [place] or any body else today or yesterday. Per nursing and chart pt wants safety alert before discharge as he is concerned about her medical issues and seizure now in case she needs help again at home.   HPI:    [name] [name] is 1981/04/20 Patient's last menstrual period was 03/28/2012. [redacted]w[redacted]d. Pt was transferred from [facility] ED for a possible seizure. She said she just blacked out when she was admitted. Pt also reported SI when she came.  Reports a lot of stressors these days as this pregnancy is unplanned, not happy with the father of this baby as he is not with her most of the time, financial issues, conflict with her family and not able to talk with them. She is also taking care of her 31 year old right now. Thinks she is just stressed out these days. Not sure if she is depressed but her mood is not good these days, reports irritable mood at times and also has mood swings lasting few mins all the time. Denies any manic symptoms. Reported AH in the past but not sure. Not sure why she was diagnosed with Bipolar d/o. Thinks her Sz makes her very upset too. Reports transport issues now and it is also a stressors for her. Reports anxious at times but not sure if it is problem for her. Reports anger issues for many years. Reports randoM SI and HI thoughts with now plans and these thoughts are going for some time now. Does not feel safe going home now. rpeorts difficulty falling and staying sleep and thinks ambien not helping.     Past Medical History  Diagnosis Date  . Migraines     would be rx'd Percacet for relief  . Seizures 05/2011    unsure what started seizures;Neurology appt on 05/30/12;on Kepra 2000mg  bid;was  on Topramax 50mg  tid  . Bipolar affective disorder     no meds currently;has been years since taking meds  . Infection     Yeast inf;not frequent  . Anemia     has taken iron supplements in the past  . Yeast infection   . H/O varicella   . Wears glasses   . Hypertension     Pt states she had pre-eclampsia w last pregnancy   . Pregnancy induced hypertension    No SI attempts in the past Past Surgical History  Procedure Date  . Cesarean section   . Wisdom tooth extraction     All 4 removed  . Tonsillectomy     During high school years    Family History  Problem Relation Age of Onset  . Cancer Mother     Ovarian  . Diabetes Mother   . Hypertension Father   . Cirrhosis Father   . Hypertension Brother   . Asthma Son     Social History:  reports that she has never smoked. She has never used smokeless tobacco. She reports that she does not drink alcohol or use illicit drugs.  Allergies: No Known Allergies  Medications: I have reviewed the patient's current medications.  Results for orders placed during the hospital encounter of 11/16/12 (from the past 48 hour(s))  GLUCOSE, CAPILLARY  Status: Abnormal   Collection Time   11/18/12  9:23 PM      Component Value Range Comment   Glucose-Capillary 126 (*) 70 - 99 mg/dL   CBC     Status: Abnormal   Collection Time   11/19/12  5:33 AM      Component Value Range Comment   WBC 11.4 (*) 4.0 - 10.5 K/uL    RBC 4.65  3.87 - 5.11 MIL/uL    Hemoglobin 10.0 (*) 12.0 - 15.0 g/dL    HCT 16.1 (*) 09.6 - 46.0 %    MCV 70.3 (*) 78.0 - 100.0 fL    MCH 21.5 (*) 26.0 - 34.0 pg    MCHC 30.6  30.0 - 36.0 g/dL    RDW 04.5 (*) 40.9 - 15.5 %    Platelets 347  150 - 400 K/uL   GLUCOSE, CAPILLARY     Status: Normal   Collection Time   11/19/12  6:35 AM      Component Value Range Comment   Glucose-Capillary 89  70 - 99 mg/dL   GLUCOSE, CAPILLARY     Status: Abnormal   Collection Time   11/19/12 11:03 AM      Component Value Range Comment    Glucose-Capillary 110 (*) 70 - 99 mg/dL   GLUCOSE, CAPILLARY     Status: Abnormal   Collection Time   11/19/12  8:06 PM      Component Value Range Comment   Glucose-Capillary 153 (*) 70 - 99 mg/dL   GLUCOSE, CAPILLARY     Status: Abnormal   Collection Time   11/19/12  9:59 PM      Component Value Range Comment   Glucose-Capillary 121 (*) 70 - 99 mg/dL   CBC     Status: Abnormal   Collection Time   11/20/12  5:35 AM      Component Value Range Comment   WBC 9.9  4.0 - 10.5 K/uL    RBC 4.46  3.87 - 5.11 MIL/uL    Hemoglobin 9.8 (*) 12.0 - 15.0 g/dL    HCT 81.1 (*) 91.4 - 46.0 %    MCV 67.7 (*) 78.0 - 100.0 fL    MCH 22.0 (*) 26.0 - 34.0 pg    MCHC 32.5  30.0 - 36.0 g/dL    RDW 78.2 (*) 95.6 - 15.5 %    Platelets 418 (*) 150 - 400 K/uL   GLUCOSE, CAPILLARY     Status: Normal   Collection Time   11/20/12  8:07 AM      Component Value Range Comment   Glucose-Capillary 88  70 - 99 mg/dL    Comment 1 Documented in Chart     GLUCOSE, CAPILLARY     Status: Abnormal   Collection Time   11/20/12 10:26 AM      Component Value Range Comment   Glucose-Capillary 100 (*) 70 - 99 mg/dL    Comment 1 Documented in Chart     GLUCOSE, CAPILLARY     Status: Abnormal   Collection Time   11/20/12  4:21 PM      Component Value Range Comment   Glucose-Capillary 123 (*) 70 - 99 mg/dL    Comment 1 Documented in Chart       Ct Head Wo Contrast  11/19/2012  *RADIOLOGY REPORT*  Clinical Data: 32 year old female with headache and pain.  Altered mental status.  CT HEAD WITHOUT CONTRAST  Technique:  Contiguous axial images were obtained from the  base of the skull through the vertex without contrast.  Comparison: 04/30/2012.  Findings: Visualized paranasal sinuses and mastoids are clear. Visualized orbits and scalp soft tissues are within normal limits.  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 No acute osseous abnormality identified.  Cerebral volume is normal.  No midline shift,  ventriculomegaly, mass effect, evidence of mass lesion, intracranial hemorrhage or evidence of cortically based acute infarction.  Gray-white matter differentiation is within normal limits throughout the brain.  No suspicious intracranial vascular hyperdensity.  IMPRESSION: Stable and negative noncontrast CT appearance of the brain.   Original Report Authenticated By: Erskine Speed, M.D.     ROS  Blood pressure 107/61, pulse 88, temperature 98.4 F (36.9 C), temperature source Oral, resp. rate 20, height 5\' 1"  (1.549 m), weight 159.303 kg (351 lb 3.2 oz), last menstrual period 03/28/2012, SpO2 98.00%. Physical Exam  Mental Status Examination/Evaluation:  Appearance: on bed   Eye Contact:: poor  Speech: normal   Volume: Normal   Mood: depressed   Affect: ristricted  Thought Process: organized   Orientation: Full   Thought Content: NO AVH  Suicidal Thoughts: None now. No plans  Homicidal Thoughts: none now  Memory: Recent; Poor   Judgement: Impaired   Insight: Lacking   Psychomotor Activity: Normal   Concentration: Fair   Recall: Fair   Akathisia: No    Assessment:  AXIS I: adjustment d/o with mixed emotions, r/o Depressive d/o nos, Hx of Bipolar d/o AXIS II: Deferred  AXIS III: see emdical hx ?  ? ?  ? ? ?  ? ? ?  ? ? ?  ? ? ?  AXIS IV: problems related to social environment, problems with access to health care services and problems with primary support group, financial issues AXIS V: 50 ? Treatment Plan/Recommendations:   1. Pt is not interested to consider any psy meds   2. Pt was encouraged to focus on problems solving skills. Pt was not interested to talk much today   3. SW can help with post discharge planning including transport issues and psy out pt follow up including therapy   4. Will follow tomorrow as needed    Wonda Cerise 11/20/2012, 8:13 PM

## 2012-11-21 DIAGNOSIS — F319 Bipolar disorder, unspecified: Secondary | ICD-10-CM

## 2012-11-21 DIAGNOSIS — Z6841 Body Mass Index (BMI) 40.0 and over, adult: Secondary | ICD-10-CM

## 2012-11-21 DIAGNOSIS — R7989 Other specified abnormal findings of blood chemistry: Secondary | ICD-10-CM

## 2012-11-21 DIAGNOSIS — O99810 Abnormal glucose complicating pregnancy: Secondary | ICD-10-CM

## 2012-11-21 LAB — CBC
HCT: 32.3 % — ABNORMAL LOW (ref 36.0–46.0)
MCH: 21.4 pg — ABNORMAL LOW (ref 26.0–34.0)
MCHC: 30.7 g/dL (ref 30.0–36.0)
MCV: 69.8 fL — ABNORMAL LOW (ref 78.0–100.0)
RDW: 18.5 % — ABNORMAL HIGH (ref 11.5–15.5)

## 2012-11-21 LAB — GLUCOSE, CAPILLARY: Glucose-Capillary: 112 mg/dL — ABNORMAL HIGH (ref 70–99)

## 2012-11-21 MED ORDER — LEVETIRACETAM ER 750 MG PO TB24: 750.0000 mg | ORAL_TABLET | Freq: Four times a day (QID) | ORAL | Status: DC

## 2012-11-21 MED ORDER — LEVETIRACETAM ER 750 MG PO TB24: 1500.0000 mg | ORAL_TABLET | Freq: Two times a day (BID) | ORAL | Status: DC

## 2012-11-21 MED ORDER — GLYBURIDE 5 MG PO TABS: 5.0000 mg | ORAL_TABLET | Freq: Every day | ORAL | Status: DC

## 2012-11-21 MED ORDER — LABETALOL HCL 200 MG PO TABS: 200.0000 mg | ORAL_TABLET | Freq: Three times a day (TID) | ORAL | Status: DC

## 2012-11-21 NOTE — Progress Notes (Signed)
Post discharge review completed. 

## 2012-11-21 NOTE — Progress Notes (Signed)
Psych in to do consult. Pt stated to her that she did not feel like talking at this time.

## 2012-11-21 NOTE — Progress Notes (Signed)
Hospital day # 5 pregnancy at [redacted]w[redacted]d  S: still c/o general body aches, constant headaches, wanting to be in the dark all the time      reports good fetal activity      Contractions:none      Vaginal bleeding:none now       Vaginal discharge: no significant change  O: BP 123/50  Pulse 106  Temp 98.2 F (36.8 C) (Oral)  Resp 18  Ht 5\' 1"  (1.549 m)  Wt 351 lb 3.2 oz (159.303 kg)  BMI 66.36 kg/m2  SpO2 98%  LMP 03/28/2012      Fetal tracings:reviewed and reassuring      Uterus gravid and non-tender      Extremities: no significant edema and no signs of DVT  A: [redacted]w[redacted]d with:    1. Seizure disorder on Keppra. No seizure activity since in hospital. Recommend follow-up with her neurologist upon discharge. Pt has not been compliant in the past.  2. Gestational diabetes: mildly elevated 2 hour PC at dinner: will start Glyburide 5 mg 30 minutes before dinner  3. CHTN: now well-controlled with Labetalol 200 mg BID  4. Chronic headaches and diffuse myalgia: likely due to depression. Head CT normal. Patient still reluctant to be on medication. Patient also refuses to seek counseling. Briefly discussed the [facility] but   I  was dismissed by the patient. Spoke with Dr Mickeal Skinner, psychiatrist, who will see patient today before discharge.  5. Multiple social issues: transportation issues, no family support in area ( patient's mother supposed to come to [facility] tomorrow. Pt refuses we communicate with her mother to explain her needs).   Social worker involvement and transportation arranged for medical appointment   P: Discharge home today after Dr Blenda Peals visit      [facility] to [facility]. Appointment 11/24/12 at 9:30  with Dr Tinnie Gens          [facility]  MD 11/21/2012 1:55 PM

## 2012-11-21 NOTE — Progress Notes (Signed)
Discharge instructions reviewed. Pt receptive. House coverage called for taxi voucher

## 2012-11-21 NOTE — Consult Note (Signed)
Patient Identification:  [name] [name] Date of Evaluation:  11/21/2012 Reason for Consult:  [name] [name] refuses to be discharged  Referring Provider:  Dr. Estanislado Pandy  History of Present Illness:[name] [name] is a single mother of 32 y.o. {staying with maternal gd-mother] who had last period 03/28/12.  She hs been non-adherent tp prenatal care complicated by recent onset of seizures.  Past Psychiatric History:she has poor relationship with fetus' father and is living alone.   She has gestational DMII, is obese, and has seizures, Bipolar Disorder with no medication and no known contact with mental health clinic.    Past Medical History:     Past Medical History  Diagnosis Date  . Migraines     would be rx'd Percacet for relief  . Seizures 05/2011    unsure what started seizures;Neurology appt on 05/30/12;on Kepra 2000mg  bid;was on Topramax 50mg  tid  . Bipolar affective disorder     no meds currently;has been years since taking meds  . Infection     Yeast inf;not frequent  . Anemia     has taken iron supplements in the past  . Yeast infection   . H/O varicella   . Wears glasses   . Hypertension     [name] [name] states she had pre-eclampsia w last pregnancy   . Pregnancy induced hypertension        Past Surgical History  Procedure Date  . Cesarean section   . Wisdom tooth extraction     All 4 removed  . Tonsillectomy     During high school years    Allergies: No Known Allergies  Current Medications:  Prior to Admission medications   Medication Sig Start Date End Date Taking? Authorizing Provider  acetaminophen (TYLENOL) 325 MG tablet Take 650 mg by mouth 2 (two) times daily as needed. For pain   Yes Historical Provider, MD  acetaminophen-codeine (TYLENOL #3) 300-30 MG per tablet Take 1-2 tablets by mouth every 6 (six) hours as needed for pain. 11/07/12  Yes Esmeralda Arthur, MD  Prenatal Vit-Fe Fumarate-FA (PRENATAL MULTIVITAMIN) TABS Take 1 tablet by mouth daily.   Yes Historical Provider, MD   promethazine (PHENERGAN) 12.5 MG tablet Take 12.5 mg by mouth every 4 (four) hours as needed. nausea 06/24/12  Yes Historical Provider, MD  VITAMIN D, CHOLECALCIFEROL, PO Take 4,000 Units by mouth daily.   Yes Historical Provider, MD  glyBURIDE (DIABETA) 5 MG tablet Take 1 tablet (5 mg total) by mouth daily with breakfast. Take 1 tablet daily 30 minutes before dinner 11/21/12   Crist Fat Rivard, MD  labetalol (NORMODYNE) 200 MG tablet Take 1 tablet (200 mg total) by mouth 3 (three) times daily. 11/21/12   Esmeralda Arthur, MD  Levetiracetam (KEPPRA XR) 750 MG TB24 Take 1 tablet (750 mg total) by mouth 4 (four) times daily. Take 2 tablets by mouth every 12 hours 11/21/12   Esmeralda Arthur, MD  levETIRAcetam 750 MG TB24 Take 2 tablets (1,500 mg total) by mouth every 12 (twelve) hours. 11/21/12   Esmeralda Arthur, MD    Social History:    reports that she has never smoked. She has never used smokeless tobacco. She reports that she does not drink alcohol or use illicit drugs.   Family History:    Family History  Problem Relation Age of Onset  . Cancer Mother     Ovarian  . Diabetes Mother   . Hypertension Father   . Cirrhosis Father   . Hypertension Brother   .  Asthma Son     Mental Status Examination/Evaluation: Objective:  Appearance: Obese, hair in disarray  Eye Contact::  None  Speech:  Slow and expresses no desire to talk  Volume:  low volume  Mood:  Indifferent about process of obtaining help  Affect:  Blunt and dismissive  Thought Process:  Negative  Orientation:  Other:  refuses to speak  Thought Content:  NA  Suicidal Thoughts:  No   response  Homicidal Thoughts:  No response  Judgement:  Impaired  Insight:  Lacking   DIAGNOSIS:   AXIS I   Bipolar Disorder, Depression complicated by Seizure Disorder  AXIS II  Deferred  AXIS III See medical notes.  AXIS IV economic problems, educational problems, occupational problems, other psychosocial or environmental problems, problems  related to social environment, problems with primary support group and [name] [name] has elevated Hgb A1c, uncontrolled CBG and seizures, weight  AXIS V 41-50 serious symptoms   Assessment/Plan:  Discussed with Dr. Estanislado Pandy,  RN,   [name] [name] walks slowly from BR to bed.  She has no eye contact, no greeting.  She states in monotone voice she has no intention of talking about anything.  MD is invited to speak but she announces she is not interested in answering questions.   She turns to her side and feigns sleep: eyes close, no response to name.     The  Encounter is atypical of a pregnant woman who is interested in prenatal care and care of the fetus.  She is obviously not engaged in control of G. DMII which also puts fetus at risk,  The origin and management of seizures is in question - a serious complication of pregnancy which requires close supervision, untreated Bipolar disorder is also a risk for her and fetus - perhaps even for her daughter.       [name] [name] indifference expressed today raises serious concerns of how she is going to be able to manage additional responsibilities - the care of a newborn with care of her daughter, management of any residual DMII, her seizures and potential flare of symptoms of her Bipolar Disorder.  These factors herald anticipation of needed preventive care.  This [name] [name] states the pregnancy was unexpected.  She needs to indicate if,with all her complex medical and emotional issues, she wishes to keep or have the baby adopted.        IF [name] [name] were to have a surge of Bipolar symptoms,  Latuda, Category B, may be considered to regulate mood.  RECOMMENDATION:  1.  [name] [name] states she does not want to talk.  Capacity cannot be determined.  2.  Suggest by this low level of invested concern of her own well-being, that the care of a newborn infant may be compromised; especially living alone and having a seizure disorder. 3.  Suggest discussion with CPS before and after birth of this baby.  4.  No  further psychiatric needs, unless requested.  5.  [name] [name] needs to be referred to outpatient mental health/[facility].  Mickeal Skinner MD   Psychiatry Consultant 11/21/2012 5:56 PM

## 2012-11-21 NOTE — Discharge Summary (Signed)
  ANTENATAL DISCHARGE SUMMARY  Patient ID: [name] [name] MRN: [id number] DOB/AGE: [birth year] 31 y.o.  Admit date: 11/16/2012 Discharge date: 11/21/2012  Admission Diagnoses: seizuredisorder  Other diagnoses: Previous cesarean section [V45.89] Migraines [346.90] Elevated hemoglobin A1c [790.6] Bipolar disorder [296.80] UTI (urinary tract infection) during pregnancy [646.60, 599.0] Pregnant [V22.2] Morbid obesity with BMI of 60.0-69.9, adult [278.01, V85.44] seizure  Discharge Diagnoses:  1. Seizure disorder     2. Gestational diabetes     3. Chronic hypertension     4. Depression         Discharged Condition: good  Hospital Course: Multiple social issues addressed with Social Work consultation. Labetalol 200 mg TID started.   Consults:  neurology, psychiatry, Social Worker and MFM  Disposition: home     Medication List     As of 11/21/2012  2:23 PM    TAKE these medications         acetaminophen 325 MG tablet   Commonly known as: TYLENOL   Take 650 mg by mouth 2 (two) times daily as needed. For pain      acetaminophen-codeine 300-30 MG per tablet   Commonly known as: TYLENOL #3   Take 1-2 tablets by mouth every 6 (six) hours as needed for pain.      glyBURIDE 5 MG tablet   Commonly known as: DIABETA   Take 1 tablet (5 mg total) by mouth daily with breakfast. Take 1 tablet daily 30 minutes before dinner      labetalol 200 MG tablet   Commonly known as: NORMODYNE   Take 1 tablet (200 mg total) by mouth 3 (three) times daily.      Levetiracetam 750 MG Tb24   Take 2 tablets (1,500 mg total) by mouth every 12 (twelve) hours.               prenatal multivitamin Tabs   Take 1 tablet by mouth daily.      promethazine 12.5 MG tablet   Commonly known as: PHENERGAN   Take 12.5 mg by mouth every 4 (four) hours as needed. nausea      VITAMIN D (CHOLECALCIFEROL) PO   Take 4,000 Units by mouth daily.           Follow-up Information    Follow up with [facility] On 11/24/2012. (your appointment is scheduled for 0930 am on Thursday Feb 6 pleaase make sure you keep your appointment )    Contact information:   [facility]         Signed: Esmeralda Arthur, MD MD 11/21/2012, 2:23 PM

## 2012-11-22 NOTE — Progress Notes (Signed)
CSW verified that [employer] received pt's application however can not reach her on the number she provided.  [employer] staff person, Annice Pih plans to send the pt a letter requesting that she contact the office to arrange transportation services.

## 2012-11-24 ENCOUNTER — Encounter: Payer: Self-pay | Admitting: Family Medicine

## 2012-11-28 ENCOUNTER — Observation Stay (HOSPITAL_COMMUNITY)
Admission: AD | Admit: 2012-11-28 | Discharge: 2012-11-29 | Disposition: A | Discharge status: Home / Self Care | Payer: Medicare Other | Source: Ambulatory Visit | Attending: Obstetrics & Gynecology | Admitting: Obstetrics & Gynecology

## 2012-11-28 ENCOUNTER — Observation Stay (HOSPITAL_COMMUNITY): Payer: Medicare Other

## 2012-11-28 ENCOUNTER — Encounter (HOSPITAL_COMMUNITY): Payer: Self-pay | Admitting: *Deleted

## 2012-11-28 DIAGNOSIS — E669 Obesity, unspecified: Secondary | ICD-10-CM | POA: Insufficient documentation

## 2012-11-28 DIAGNOSIS — O163 Unspecified maternal hypertension, third trimester: Secondary | ICD-10-CM

## 2012-11-28 DIAGNOSIS — G40909 Epilepsy, unspecified, not intractable, without status epilepticus: Secondary | ICD-10-CM

## 2012-11-28 DIAGNOSIS — O2340 Unspecified infection of urinary tract in pregnancy, unspecified trimester: Secondary | ICD-10-CM

## 2012-11-28 DIAGNOSIS — O10019 Pre-existing essential hypertension complicating pregnancy, unspecified trimester: Secondary | ICD-10-CM | POA: Insufficient documentation

## 2012-11-28 DIAGNOSIS — Z98891 History of uterine scar from previous surgery: Secondary | ICD-10-CM

## 2012-11-28 DIAGNOSIS — G43909 Migraine, unspecified, not intractable, without status migrainosus: Secondary | ICD-10-CM

## 2012-11-28 DIAGNOSIS — F319 Bipolar disorder, unspecified: Secondary | ICD-10-CM | POA: Insufficient documentation

## 2012-11-28 DIAGNOSIS — O99340 Other mental disorders complicating pregnancy, unspecified trimester: Secondary | ICD-10-CM

## 2012-11-28 DIAGNOSIS — O99810 Abnormal glucose complicating pregnancy: Secondary | ICD-10-CM | POA: Insufficient documentation

## 2012-11-28 DIAGNOSIS — O99350 Diseases of the nervous system complicating pregnancy, unspecified trimester: Secondary | ICD-10-CM

## 2012-11-28 DIAGNOSIS — R7309 Other abnormal glucose: Secondary | ICD-10-CM

## 2012-11-28 LAB — COMPREHENSIVE METABOLIC PANEL
Albumin: 2.5 g/dL — ABNORMAL LOW (ref 3.5–5.2)
Alkaline Phosphatase: 100 U/L (ref 39–117)
BUN: 6 mg/dL (ref 6–23)
Calcium: 9.3 mg/dL (ref 8.4–10.5)
Creatinine, Ser: 0.51 mg/dL (ref 0.50–1.10)
GFR calc Af Amer: >90 mL/min (ref >90)
Glucose, Bld: 91 mg/dL (ref 70–99)
Potassium: 3.9 mEq/L (ref 3.5–5.1)
Total Protein: 6.6 g/dL (ref 6.0–8.3)

## 2012-11-28 LAB — CBC
HCT: 34.5 % — ABNORMAL LOW (ref 36.0–46.0)
RBC: 4.96 MIL/uL (ref 3.87–5.11)
RDW: 18.4 % — ABNORMAL HIGH (ref 11.5–15.5)
WBC: 9.8 10*3/uL (ref 4.0–10.5)

## 2012-11-28 LAB — URINALYSIS, ROUTINE W REFLEX MICROSCOPIC
Leukocytes, UA: NEGATIVE
Nitrite: NEGATIVE
Protein, ur: NEGATIVE mg/dL
Specific Gravity, Urine: 1.025 (ref 1.005–1.030)
Urobilinogen, UA: 0.2 mg/dL (ref 0.0–1.0)

## 2012-11-28 LAB — PROTEIN / CREATININE RATIO, URINE
Creatinine, Urine: 149.69 mg/dL
Total Protein, Urine: 18.3 mg/dL

## 2012-11-28 LAB — GLUCOSE, CAPILLARY: Glucose-Capillary: 108 mg/dL — ABNORMAL HIGH (ref 70–99)

## 2012-11-28 MED ORDER — ACETAMINOPHEN 325 MG PO TABS
650.0000 mg | ORAL_TABLET | ORAL | Status: DC | PRN
  Administered 2012-11-29: 650 mg via ORAL
  Filled 2012-11-28: qty 2

## 2012-11-28 MED ORDER — LABETALOL HCL 100 MG PO TABS
200.0000 mg | ORAL_TABLET | Freq: Three times a day (TID) | ORAL | Status: DC
  Administered 2012-11-28 – 2012-11-29 (×2): 200 mg via ORAL
  Filled 2012-11-28 (×6): qty 2

## 2012-11-28 MED ORDER — INSULIN ASPART 100 UNIT/ML ~~LOC~~ SOLN: 0.0000 [IU] | Freq: Three times a day (TID) | SUBCUTANEOUS | Status: DC

## 2012-11-28 MED ORDER — PRENATAL MULTIVITAMIN CH
1.0000 | ORAL_TABLET | Freq: Every day | ORAL | Status: DC
  Administered 2012-11-29: 1 via ORAL
  Filled 2012-11-28: qty 1

## 2012-11-28 MED ORDER — DOCUSATE SODIUM 100 MG PO CAPS
100.0000 mg | ORAL_CAPSULE | Freq: Every day | ORAL | Status: DC
  Administered 2012-11-29: 100 mg via ORAL
  Filled 2012-11-28: qty 1

## 2012-11-28 MED ORDER — CALCIUM CARBONATE ANTACID 500 MG PO CHEW: 2.0000 | CHEWABLE_TABLET | ORAL | Status: DC | PRN

## 2012-11-28 MED ORDER — PROMETHAZINE HCL 25 MG PO TABS
12.5000 mg | ORAL_TABLET | ORAL | Status: DC | PRN
  Administered 2012-11-28 – 2012-11-29 (×2): 12.5 mg via ORAL
  Filled 2012-11-28 (×2): qty 1

## 2012-11-28 MED ORDER — HEPARIN SODIUM (PORCINE) 5000 UNIT/ML IJ SOLN: 5000.0000 [IU] | Freq: Three times a day (TID) | INTRAMUSCULAR | Status: DC

## 2012-11-28 MED ORDER — ZOLPIDEM TARTRATE 5 MG PO TABS
5.0000 mg | ORAL_TABLET | Freq: Every evening | ORAL | Status: DC | PRN
  Administered 2012-11-28: 5 mg via ORAL
  Filled 2012-11-28: qty 1

## 2012-11-28 MED ORDER — LEVETIRACETAM ER 500 MG PO TB24
3000.0000 mg | ORAL_TABLET | Freq: Every morning | ORAL | Status: DC
  Filled 2012-11-28: qty 6

## 2012-11-28 MED ORDER — PRENATAL MULTIVITAMIN CH
1.0000 | ORAL_TABLET | Freq: Every day | ORAL | Status: DC
  Filled 2012-11-28: qty 1

## 2012-11-28 NOTE — MAU Note (Signed)
Thinks that she had a seizure this AM; c/o headache and soreness on the R side of her head; has gestational diabetes and PIH;

## 2012-11-28 NOTE — MAU Provider Note (Signed)
Chief Complaint:  Seizures   HPI: [name] [name] is a 32 y.o. G2P1001 at [redacted]w[redacted]d with h/o seizure disorder, gestational diabetes and chronic hypertension vs gestational hypertension who presents to maternity admissions reporting seizure activity this morning.  Patient reports that she was asleep this am and woke up on the floor with items from her dresser knocked on the floor. She doesn't know how long this lasted. She lives alone and this was un-witnessed. She has chronic headaches, but since the seizure she feels worsening headache located on top of head. She thinks she hit her head and also reports hitting the left side of her body. She denies urine or bowel incontinence. She states she bit her tongue. She reports compliance with keppra.   She reports some decreased fetal movement compared to baseline. Denies any contractions. Has been having increased vaginal discharge for a week or so.   She was admitted to the hospital on 1/29 for similar presentation of seizure. She was found to have elevated BP and PIH labs were unremarkable with total 24hr protein of 120. She was also found to be diabetic and discharged with glyburide which she did not fill. She reports compliance with labetolol 200tid which was started last admission.    Pregnancy Course:  Complicated by gestational diabetes, chronic hypertension, seizure disorder, non compliance and poor social conditions as well as bipolar depression.  Past Medical History: Past Medical History  Diagnosis Date  . Migraines     would be rx'd Percacet for relief  . Seizures 05/2011    unsure what started seizures;Neurology appt on 05/30/12;on Kepra 2000mg  bid;was on Topramax 50mg  tid  . Bipolar affective disorder     no meds currently;has been years since taking meds  . Infection     Yeast inf;not frequent  . Anemia     has taken iron supplements in the past  . Yeast infection   . H/O varicella   . Wears glasses   . Hypertension     Pt states she  had pre-eclampsia w last pregnancy   . Pregnancy induced hypertension     Past obstetric history: OB History   Grav Para Term Preterm Abortions TAB SAB Ect Mult Living   2 1 1       1      # Outc Date GA Lbr Len/2nd Wgt Sex Del Anes PTL Lv   1 TRM 6/04 [redacted]w[redacted]d 48:00 3.204kg(7lb1oz) M CS Spinal  Yes   Comments: Failure to progress   2 CUR               Past Surgical History: Past Surgical History  Procedure Laterality Date  . Cesarean section    . Wisdom tooth extraction      All 4 removed  . Tonsillectomy      During high school years    Family History: Family History  Problem Relation Age of Onset  . Cancer Mother     Ovarian  . Diabetes Mother   . Hypertension Father   . Cirrhosis Father   . Hypertension Brother   . Asthma Son     Social History: History  Substance Use Topics  . Smoking status: Never Smoker   . Smokeless tobacco: Never Used  . Alcohol Use: No    Allergies: No Known Allergies  Meds:  Prescriptions prior to admission  Medication Sig Dispense Refill  . acetaminophen (TYLENOL) 325 MG tablet Take 650 mg by mouth 2 (two) times daily as needed. For pain      .  acetaminophen-codeine (TYLENOL #3) 300-30 MG per tablet Take 1-2 tablets by mouth every 6 (six) hours as needed for pain.  15 tablet  0  . labetalol (NORMODYNE) 200 MG tablet Take 1 tablet (200 mg total) by mouth 3 (three) times daily.  90 tablet  3  . Levetiracetam 750 MG TB24 Take 3,000 mg by mouth every morning.      . Prenatal Vit-Fe Fumarate-FA (PRENATAL MULTIVITAMIN) TABS Take 1 tablet by mouth daily.      . promethazine (PHENERGAN) 12.5 MG tablet Take 12.5 mg by mouth every 4 (four) hours as needed. nausea      . VITAMIN D, CHOLECALCIFEROL, PO Take 4,000 Units by mouth daily.        ROS: Pertinent findings in history of present illness.  Physical Exam  Blood pressure 144/97, pulse 97, resp. rate 20, height 5\' 1"  (1.549 m), weight 160.12 kg (353 lb), last menstrual period  03/28/2012. GENERAL: Well-developed, well-nourished female in no acute distress.  HEENT: normocephalic HEART: normal rate RESP: normal effort ABDOMEN: Soft, tender on left side of abdomen, gravid EXTREMITIES: Nontender, no edema NEURO: alert and oriented, CN2-12 grossly intact, +1 DTR b/l Skin - light echymosis on left thigh with tenderness to palpation along area.      FHT:  Baseline 140 , moderate variability, accelerations present, no decelerations Contractions: irritability   Labs: No results found for this or any previous visit (from the past 24 hour(s)).  Imaging:  Ct Head Wo Contrast  11/19/2012  *RADIOLOGY REPORT*  Clinical Data: 32 year old female with headache and pain.  Altered mental status.  CT HEAD WITHOUT CONTRAST  Technique:  Contiguous axial images were obtained from the base of the skull through the vertex without contrast.  Comparison: 04/30/2012.  Findings: Visualized paranasal sinuses and mastoids are clear. Visualized orbits and scalp soft tissues are within normal limits.  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 No acute osseous abnormality identified.  Cerebral volume is normal.  No midline shift, ventriculomegaly, mass effect, evidence of mass lesion, intracranial hemorrhage or evidence of cortically based acute infarction.  Gray-white matter differentiation is within normal limits throughout the brain.  No suspicious intracranial vascular hyperdensity.  IMPRESSION: Stable and negative noncontrast CT appearance of the brain.   Original Report Authenticated By: Erskine Speed, M.D.    US Ob Follow Up  11/16/2012  OBSTETRICAL ULTRASOUND: This exam was performed within a [facility]. The OB US report was generated in the [facility] system, and faxed to the ordering physician.   This report is also available in [employer] and in the [employer]. See [facility] Obstetric US report.   MAU Course: - obtain PIH labs,  Pr/Cr ratio - obtain limited US - will admit to antepartum for closer observation given trauma with seizure activity.   Assessment: 1. Elevated hemoglobin A1c   2. Seizure disorder   3. Bipolar disorder   4. Migraines   5. Morbid obesity with BMI of 60.0-69.9, adult   6. Previous cesarean section   7. UTI (urinary tract infection) during pregnancy    32 y.o. G2P1001 at [redacted]w[redacted]d with h/o seizure disorder, gestational diabetes and chronic hypertension vs gestational hypertension who presents to maternity admissions reporting seizure activity this morning.   Plan: Admit to antepartum for closer observation and neurology consult.     Medication List    ASK your doctor about these medications       acetaminophen 325 MG tablet  Commonly  known as:  TYLENOL  Take 650 mg by mouth 2 (two) times daily as needed. For pain     acetaminophen-codeine 300-30 MG per tablet  Commonly known as:  TYLENOL #3  Take 1-2 tablets by mouth every 6 (six) hours as needed for pain.     labetalol 200 MG tablet  Commonly known as:  NORMODYNE  Take 1 tablet (200 mg total) by mouth 3 (three) times daily.     Levetiracetam 750 MG Tb24  Take 3,000 mg by mouth every morning.     prenatal multivitamin Tabs  Take 1 tablet by mouth daily.     promethazine 12.5 MG tablet  Commonly known as:  PHENERGAN  Take 12.5 mg by mouth every 4 (four) hours as needed. nausea     VITAMIN D (CHOLECALCIFEROL) PO  Take 4,000 Units by mouth daily.        Lonia Skinner, MD 11/28/2012 7:23 PM  .I have seen the patient with the resident/student and agree with the above.  Tawnya Crook

## 2012-11-29 ENCOUNTER — Encounter (HOSPITAL_COMMUNITY): Payer: Self-pay | Admitting: *Deleted

## 2012-11-29 ENCOUNTER — Ambulatory Visit (HOSPITAL_COMMUNITY): Payer: Medicare Other

## 2012-11-29 DIAGNOSIS — R7989 Other specified abnormal findings of blood chemistry: Secondary | ICD-10-CM

## 2012-11-29 LAB — GLUCOSE, CAPILLARY: Glucose-Capillary: 95 mg/dL (ref 70–99)

## 2012-11-29 LAB — LEVETIRACETAM LEVEL: Levetiracetam Lvl: 17.2 ug/mL (ref 5.0–30.0)

## 2012-11-29 MED ORDER — LEVETIRACETAM ER 500 MG PO TB24
1500.0000 mg | ORAL_TABLET | Freq: Every day | ORAL | Status: DC
  Filled 2012-11-29: qty 3

## 2012-11-29 MED ORDER — LABETALOL HCL 200 MG PO TABS: 200.0000 mg | ORAL_TABLET | Freq: Three times a day (TID) | ORAL | Status: DC

## 2012-11-29 MED ORDER — LEVETIRACETAM ER 750 MG PO TB24: 2250.0000 mg | ORAL_TABLET | Freq: Every morning | ORAL | Status: DC

## 2012-11-29 MED ORDER — PROMETHAZINE HCL 12.5 MG PO TABS: 12.5000 mg | ORAL_TABLET | ORAL | Status: DC | PRN

## 2012-11-29 MED ORDER — LEVETIRACETAM ER 500 MG PO TB24
2000.0000 mg | ORAL_TABLET | Freq: Every morning | ORAL | Status: DC
  Administered 2012-11-29: 2000 mg via ORAL
  Filled 2012-11-29 (×2): qty 4

## 2012-11-29 MED ORDER — LEVETIRACETAM ER 500 MG PO TB24
1500.0000 mg | ORAL_TABLET | Freq: Two times a day (BID) | ORAL | Status: DC
  Administered 2012-11-29: 1500 mg via ORAL
  Filled 2012-11-29 (×2): qty 3

## 2012-11-29 MED ORDER — LEVETIRACETAM ER 750 MG PO TB24: 1500.0000 mg | ORAL_TABLET | Freq: Every day | ORAL | Status: DC

## 2012-11-29 NOTE — Progress Notes (Signed)
CSW is familiar with pt & situation.  CSW was asked to see pt after RN over heard pt telling FOB that she planned to make an adoption plan.  While pt acknowledges making the comments, she does not plan to make an adoption plan.  Pt said she made the statement because he isn't going to "take care of the baby any way."  She plans to parent at this time.  Previous admission this CSW faxed completed [employer] application to arrange transportation.  CSW spoke with Melida Quitter, [employer] staff person (today), who said she has called pt several times and mailed a letter requesting a call back for transportation services to be arranged.  Pt denies that she received any calls of mail.  CSW provided pt with the contact information for [employer], again & encouraged her to call.  CSW also provided pt the number for Medicaid transportation, as another transportation option.  CSW has arranged appropriate transportation services.  It is now the pt's responsibility to follow through to obtain services.

## 2012-11-29 NOTE — Progress Notes (Signed)
Pt noted to be gone without calling and letting the nurse know.  Discharge instructions were already given and pt verlbalizes understanding.

## 2012-11-29 NOTE — Consult Note (Signed)
Reason for Consult: Seizure Referring Physician: Pauletta Browns  CC: Seizure  History is obtained from:Patient, medical record  HPI: [name] [name] is a 32 y.o. female who began having seizures in August 2012.  At that time, She had an EEG which was normal. Her friend is with her has witnessesd an episode, but states taht she just "shakes all over" and both patient and friend are poor historians. She has not had MR imaging because of her girth, but CT scans have been negative.   Today, she awoke on the floor having bitten her tongue and felt as she typically does after a seizure. This was unwitnessed.    ROS: A 14 point ROS was performed and is negative except as noted in the HPI.  Past Medical History  Diagnosis Date  . Migraines     would be rx'd Percacet for relief  . Seizures 05/2011    unsure what started seizures;Neurology appt on 05/30/12;on Kepra 2000mg  bid;was on Topramax 50mg  tid  . Bipolar affective disorder     no meds currently;has been years since taking meds  . Infection     Yeast inf;not frequent  . Anemia     has taken iron supplements in the past  . Yeast infection   . H/O varicella   . Wears glasses   . Hypertension     Pt states she had pre-eclampsia w last pregnancy   . Pregnancy induced hypertension     Family History: Mother cancer, DM Father - htn  Social History: Never smoker  Exam: Current vital signs: BP 125/69  Pulse 95  Temp(Src) 98.3 F (36.8 C) (Oral)  Resp 22  Ht 5\' 1"  (1.549 m)  Wt 160.12 kg (353 lb)  BMI 66.73 kg/m2  LMP 03/28/2012 Vital signs in last 24 hours: Temp:  [98.3 F (36.8 C)] 98.3 F (36.8 C) (02/10 2130) Pulse Rate:  [95-97] 95 (02/10 2130) Resp:  [20-22] 22 (02/10 2130) BP: (125-144)/(69-97) 125/69 mmHg (02/10 2130) Weight:  [160.12 kg (353 lb)] 160.12 kg (353 lb) (02/10 2200)  General: in bed, NAD CV: RRR Mental Status: Patient is awake, alert, oriented to person, place, month, year, and situation. Immediate and  remote memory are intact. Patient is poor historian.  No signs of neglect or aphasia.  Cranial Nerves: II: Visual Fields are full. Pupils are equal, round, and reactive to light.  Discs are difficult to visualize. III,IV, VI: EOMI without ptosis or diploplia.  V: Facial sensation is symmetric to temperature VII: Facial movement is symmetric.  VIII: hearing is intact to voice X: Uvula elevates symmetrically XI: Shoulder shrug is symmetric. XII: tongue is midline without atrophy or fasciculations.  Motor: Tone is normal. Bulk is normal. 5/5 strength was present in right arm and legs,  her left arm exam is somewhat limited by pain but she moves it well and has at least 4+/5 strength.  Sensory: Sensation is symmetric to light touch and temperature in the arms and legs. Deep Tendon Reflexes: 1+ and symmetric in the biceps and patellae.  Cerebellar: FNF  intact bilaterally Gait: Not tested due to patient safety concerns.   I have reviewed labs in epic and the results pertinent to this consultation are: CMP, CBC unremarkable other than mild anemia and low albumin  CT head 11/19/12 - negative  Impression: 32 yo F with recurrent episodes concerning for seizure. She seems fairly unconcerned about her current episodes. She has been taking her keppra qday and though it is XR, with the  incerased clearance from pregnancy I feel that BID dosing would be better. She was prescribed 750 mg tabs, but is unsure what she has been taking.   Recommendations: 1) If taking 3000mg  KeppraXR qday, would change dosing to 2,250 qam and 1500 qpm.  2) If on keppra IR, would dose at 1500qam 750mg  mid-day and 1500mg  qpm.  3) If patient continues to have seizures after delivery, I would favor having her outpatient neurologist refer her for an EMU stay to confirm her diagnosis as there is some question in my mind. This cannot be done with this hospitalization.  4) If on other dosing regimen prior to arrival, or if  further questions remain please call, neurology will sign off at this time.    Ritta Slot, MD [facility] [phone]  If 7pm- 7am, please page neurology on call at [phone].

## 2012-11-29 NOTE — Discharge Summary (Signed)
Antenatal Physician Discharge Summary  Patient ID: [name] [name]   MRN: [id number]  DOB/AGE: 31-Sep-1982 31 y.o.   Admit date: 11/28/2012  Discharge date: 11/29/2012   Admission Diagnoses:  Seizure during pregnancy, IUP at 35w, Morbid obesity, Gestational diabetes, hypertension in pregnancy  Discharge Diagnoses:  Same  Prenatal Procedures: Ultrasound, NST  Consults: Neurology, Ritta Slot MD  Significant Diagnostic Studies: labs, ultrasound  Results for orders placed during the hospital encounter of 11/28/12 (from the past 24 hour(s))  COMPREHENSIVE METABOLIC PANEL     Status: Abnormal   Collection Time    11/28/12  7:42 PM      Result Value Range   Sodium 137  135 - 145 mEq/L   Potassium 3.9  3.5 - 5.1 mEq/L   Chloride 103  96 - 112 mEq/L   CO2 22  19 - 32 mEq/L   Glucose, Bld 91  70 - 99 mg/dL   BUN 6  6 - 23 mg/dL   Creatinine, Ser 4.09  0.50 - 1.10 mg/dL   Calcium 9.3  8.4 - 81.1 mg/dL   Total Protein 6.6  6.0 - 8.3 g/dL   Albumin 2.5 (*) 3.5 - 5.2 g/dL   AST 18  0 - 37 U/L   ALT 16  0 - 35 U/L   Alkaline Phosphatase 100  39 - 117 U/L   Total Bilirubin 0.2 (*) 0.3 - 1.2 mg/dL   GFR calc non Af Amer >90  >90 mL/min   GFR calc Af Amer >90  >90 mL/min  CBC     Status: Abnormal   Collection Time    11/28/12  7:42 PM      Result Value Range   WBC 9.8  4.0 - 10.5 K/uL   RBC 4.96  3.87 - 5.11 MIL/uL   Hemoglobin 10.6 (*) 12.0 - 15.0 g/dL   HCT 91.4 (*) 78.2 - 95.6 %   MCV 69.6 (*) 78.0 - 100.0 fL   MCH 21.4 (*) 26.0 - 34.0 pg   MCHC 30.7  30.0 - 36.0 g/dL   RDW 21.3 (*) 08.6 - 57.8 %   Platelets 383  150 - 400 K/uL  URINALYSIS, ROUTINE W REFLEX MICROSCOPIC     Status: Abnormal   Collection Time    11/28/12  8:10 PM      Result Value Range   Color, Urine YELLOW  YELLOW   APPearance CLEAR  CLEAR   Specific Gravity, Urine 1.025  1.005 - 1.030   pH 6.0  5.0 - 8.0   Glucose, UA NEGATIVE  NEGATIVE mg/dL   Hgb urine dipstick NEGATIVE  NEGATIVE   Bilirubin  Urine NEGATIVE  NEGATIVE   Ketones, ur 15 (*) NEGATIVE mg/dL   Protein, ur NEGATIVE  NEGATIVE mg/dL   Urobilinogen, UA 0.2  0.0 - 1.0 mg/dL   Nitrite NEGATIVE  NEGATIVE   Leukocytes, UA NEGATIVE  NEGATIVE  PROTEIN / CREATININE RATIO, URINE     Status: None   Collection Time    11/28/12  8:10 PM      Result Value Range   Creatinine, Urine 149.69     Total Protein, Urine 18.3     PROTEIN CREATININE RATIO 0.12  0.00 - 0.15  GLUCOSE, CAPILLARY     Status: None   Collection Time    11/28/12  8:17 PM      Result Value Range   Glucose-Capillary 86  70 - 99 mg/dL  GLUCOSE, CAPILLARY     Status: Abnormal  Collection Time    11/28/12  9:42 PM      Result Value Range   Glucose-Capillary 108 (*) 70 - 99 mg/dL   Comment 1 Documented in Chart    PREPARE RBC (CROSSMATCH)     Status: None   Collection Time    11/28/12 10:30 PM      Result Value Range   Order Confirmation ORDER PROCESSED BY BLOOD BANK    LEVETIRACETAM LEVEL     Status: None   Collection Time    11/28/12 10:53 PM      Result Value Range   Levetiracetam Lvl 17.2  5.0 - 30.0 ug/mL  TYPE AND SCREEN     Status: None   Collection Time    11/28/12 10:53 PM      Result Value Range   ABO/RH(D) O POS     Antibody Screen NEG     Sample Expiration 12/01/2012     Unit Number [id number]     Blood Component Type RED CELLS,LR     Unit division 00     Status of Unit ALLOCATED     Transfusion Status OK TO TRANSFUSE     Crossmatch Result Compatible     Unit Number [id number]     Blood Component Type RED CELLS,LR     Unit division 00     Status of Unit ALLOCATED     Transfusion Status OK TO TRANSFUSE     Crossmatch Result Compatible    GLUCOSE, CAPILLARY     Status: None   Collection Time    11/29/12 10:22 AM      Result Value Range   Glucose-Capillary 95  70 - 99 mg/dL     Treatments: Keppra, labetalol  Hospital Course:  This is a 32 y.o. G2P1001 at [redacted]w[redacted]d admitted for fetal monitoring following seizure during third  trimester. Seizure was unwitnessed and patient had no seizures during hospitalization.  She was observed, fetal heart rate monitoring remained reassuring, and she had no signs/symptoms of preterm labor. Neurology was consulted and suggested increasing patient's dose of Keppra to twice a day. MRI was not feasible given body habitus/size. Outpatient follow-up after delivery was recommended.   The patient also has chronic hypertension and is on labetalol 200 mg TID.  Her BP were well controlled during hospital course with only occasional pressure over 140/90. She is also a gestational diabetic but has not been checking her sugars or taking any medication for diabetes. Her blood sugars were 86-108 during admission. Fetal heart tracing and ultrasound were reassuring. The patient had no vaginal bleeding, no sustained contractions and no loss of fluid during her stay.   She was deemed stable for discharge to home with outpatient follow up.  Discharged Condition: stable  Discharge Exam: @V @ General appearance: alert, no distress and patient not overly cooperative, but not combative or hostile. Eyes: EOMI, conjunctiva clear Resp: normal rate, no respiratory distress, no wheezes or coug Cardio: regular rate and rhythm GI: obese, soft, non-tender Neurologic: Grossly normal  Disposition: Home  Diet:  Diabetic, low carb Activity:  As tolerated   Medication List    STOP taking these medications       acetaminophen-codeine 300-30 MG per tablet  Commonly known as:  TYLENOL #3      TAKE these medications       acetaminophen 325 MG tablet  Commonly known as:  TYLENOL  Take 650 mg by mouth 2 (two) times daily as needed. For pain  labetalol 200 MG tablet  Commonly known as:  NORMODYNE  Take 1 tablet (200 mg total) by mouth 3 (three) times daily.     Levetiracetam 750 MG Tb24  Take 3 tablets (2,250 mg total) by mouth every morning.     Levetiracetam 750 MG Tb24  Take 2 tablets (1,500 mg  total) by mouth daily at 10 pm.     prenatal multivitamin Tabs  Take 1 tablet by mouth daily.     promethazine 12.5 MG tablet  Commonly known as:  PHENERGAN  Take 12.5 mg by mouth every 4 (four) hours as needed. nausea     promethazine 12.5 MG tablet  Commonly known as:  PHENERGAN  Take 1 tablet (12.5 mg total) by mouth every 4 (four) hours as needed.     VITAMIN D (CHOLECALCIFEROL) PO  Take 4,000 Units by mouth daily.        Follow up in [facility] on 12/05/12 at 8:00 AM. Labor precautions reviewed.  Signed: Napoleon Form, MD 11/29/2012 4:23 PM

## 2012-11-29 NOTE — Plan of Care (Signed)
Problem: Consults Goal: Birthing Suites Patient Information Press F2 to bring up selections list Outcome: Completed/Met Date Met:  11/29/12  Pt < [redacted] weeks EGA and Diabetic

## 2012-11-29 NOTE — Progress Notes (Signed)
Dr. Amada Jupiter (Neurology consult) in to assess pt

## 2012-11-29 NOTE — Progress Notes (Signed)
[place] CSW in to talk to pt about her transportation issues.  Pt will try to call for a ride.  Will call CSW back if need a bus pass.

## 2012-11-29 NOTE — Progress Notes (Signed)
UR completed 

## 2012-11-29 NOTE — Discharge Summary (Signed)
Attestation of Attending Supervision of Obstetric Fellow: Evaluation and management procedures were performed by the Obstetric Fellow under my supervision and collaboration.  I have reviewed the Obstetric Fellow's note and chart, and I agree with the management and plan.  Lawrence Mitch, MD, FACOG Attending Obstetrician & Gynecologist Faculty Practice, [facility]   

## 2012-11-30 LAB — TYPE AND SCREEN: Unit division: 0

## 2012-12-05 ENCOUNTER — Encounter: Payer: Self-pay | Admitting: Obstetrics and Gynecology

## 2012-12-05 ENCOUNTER — Encounter: Payer: Self-pay | Admitting: *Deleted

## 2012-12-09 NOTE — MAU Provider Note (Signed)
Pt seen and examined. Agree with above note.  LEGGETT,KELLY H. 12/09/2012 12:11 AM

## 2012-12-11 ENCOUNTER — Observation Stay (HOSPITAL_COMMUNITY)
Admission: AD | Admit: 2012-12-11 | Discharge: 2012-12-12 | Disposition: A | Discharge status: Home / Self Care | Payer: Medicare Other | Source: Ambulatory Visit | Attending: Obstetrics and Gynecology | Admitting: Obstetrics and Gynecology

## 2012-12-11 ENCOUNTER — Encounter (HOSPITAL_COMMUNITY): Payer: Self-pay | Admitting: Emergency Medicine

## 2012-12-11 DIAGNOSIS — O99350 Diseases of the nervous system complicating pregnancy, unspecified trimester: Secondary | ICD-10-CM | POA: Insufficient documentation

## 2012-12-11 DIAGNOSIS — Z3490 Encounter for supervision of normal pregnancy, unspecified, unspecified trimester: Secondary | ICD-10-CM

## 2012-12-11 DIAGNOSIS — F319 Bipolar disorder, unspecified: Secondary | ICD-10-CM

## 2012-12-11 DIAGNOSIS — G40909 Epilepsy, unspecified, not intractable, without status epilepticus: Principal | ICD-10-CM

## 2012-12-11 DIAGNOSIS — O10019 Pre-existing essential hypertension complicating pregnancy, unspecified trimester: Secondary | ICD-10-CM | POA: Insufficient documentation

## 2012-12-11 DIAGNOSIS — O34219 Maternal care for unspecified type scar from previous cesarean delivery: Secondary | ICD-10-CM

## 2012-12-11 DIAGNOSIS — G43909 Migraine, unspecified, not intractable, without status migrainosus: Secondary | ICD-10-CM

## 2012-12-11 DIAGNOSIS — Z98891 History of uterine scar from previous surgery: Secondary | ICD-10-CM

## 2012-12-11 DIAGNOSIS — R569 Unspecified convulsions: Secondary | ICD-10-CM

## 2012-12-11 DIAGNOSIS — Z6841 Body Mass Index (BMI) 40.0 and over, adult: Secondary | ICD-10-CM | POA: Insufficient documentation

## 2012-12-11 DIAGNOSIS — E669 Obesity, unspecified: Secondary | ICD-10-CM | POA: Insufficient documentation

## 2012-12-11 DIAGNOSIS — O2340 Unspecified infection of urinary tract in pregnancy, unspecified trimester: Secondary | ICD-10-CM

## 2012-12-11 LAB — CBC WITH DIFFERENTIAL/PLATELET
Basophils Absolute: 0 10*3/uL (ref 0.0–0.1)
Basophils Relative: 0 % (ref 0–1)
Eosinophils Absolute: 0.1 10*3/uL (ref 0.0–0.7)
Eosinophils Relative: 1 % (ref 0–5)
HCT: 32.9 % — ABNORMAL LOW (ref 36.0–46.0)
Hemoglobin: 10.4 g/dL — ABNORMAL LOW (ref 12.0–15.0)
Lymphocytes Relative: 24 % (ref 12–46)
Lymphs Abs: 2.3 10*3/uL (ref 0.7–4.0)
MCH: 21.6 pg — ABNORMAL LOW (ref 26.0–34.0)
MCHC: 31.6 g/dL (ref 30.0–36.0)
MCV: 68.3 fL — ABNORMAL LOW (ref 78.0–100.0)
Monocytes Absolute: 0.5 10*3/uL (ref 0.1–1.0)
Monocytes Relative: 5 % (ref 3–12)
Neutro Abs: 6.6 10*3/uL (ref 1.7–7.7)
Neutrophils Relative %: 70 % (ref 43–77)
Platelets: 384 10*3/uL (ref 150–400)
RBC: 4.82 MIL/uL (ref 3.87–5.11)
RDW: 18.2 % — ABNORMAL HIGH (ref 11.5–15.5)
WBC: 9.5 10*3/uL (ref 4.0–10.5)

## 2012-12-11 LAB — COMPREHENSIVE METABOLIC PANEL
ALT: 12 U/L (ref 0–35)
AST: 14 U/L (ref 0–37)
Albumin: 2.3 g/dL — ABNORMAL LOW (ref 3.5–5.2)
Calcium: 9 mg/dL (ref 8.4–10.5)
Creatinine, Ser: 0.47 mg/dL — ABNORMAL LOW (ref 0.50–1.10)
Sodium: 135 mEq/L (ref 135–145)
Total Protein: 6.6 g/dL (ref 6.0–8.3)

## 2012-12-11 LAB — URINALYSIS, ROUTINE W REFLEX MICROSCOPIC
Glucose, UA: NEGATIVE mg/dL
Ketones, ur: NEGATIVE mg/dL
Leukocytes, UA: NEGATIVE
Nitrite: NEGATIVE
Protein, ur: NEGATIVE mg/dL

## 2012-12-11 MED ORDER — ZOLPIDEM TARTRATE 5 MG PO TABS
5.0000 mg | ORAL_TABLET | Freq: Every evening | ORAL | Status: DC | PRN
  Administered 2012-12-11: 5 mg via ORAL
  Filled 2012-12-11: qty 1

## 2012-12-11 MED ORDER — CALCIUM CARBONATE ANTACID 500 MG PO CHEW
2.0000 | CHEWABLE_TABLET | ORAL | Status: DC | PRN
  Filled 2012-12-11: qty 2

## 2012-12-11 MED ORDER — LEVETIRACETAM ER 500 MG PO TB24
2500.0000 mg | ORAL_TABLET | Freq: Every morning | ORAL | Status: DC
  Administered 2012-12-12: 2500 mg via ORAL
  Filled 2012-12-11: qty 5

## 2012-12-11 MED ORDER — LEVETIRACETAM ER 500 MG PO TB24
2000.0000 mg | ORAL_TABLET | Freq: Every day | ORAL | Status: DC
  Administered 2012-12-11: 2000 mg via ORAL
  Filled 2012-12-11 (×2): qty 4

## 2012-12-11 MED ORDER — LEVETIRACETAM ER 500 MG PO TB24: 2000.0000 mg | ORAL_TABLET | Freq: Every day | ORAL | Status: DC

## 2012-12-11 MED ORDER — ACETAMINOPHEN 325 MG PO TABS
650.0000 mg | ORAL_TABLET | ORAL | Status: DC | PRN
  Administered 2012-12-11 – 2012-12-12 (×2): 650 mg via ORAL
  Filled 2012-12-11 (×2): qty 2

## 2012-12-11 MED ORDER — DOCUSATE SODIUM 100 MG PO CAPS
100.0000 mg | ORAL_CAPSULE | Freq: Every day | ORAL | Status: DC
  Administered 2012-12-12: 100 mg via ORAL
  Filled 2012-12-11 (×3): qty 1

## 2012-12-11 MED ORDER — LORAZEPAM 1 MG PO TABS
2.0000 mg | ORAL_TABLET | Freq: Once | ORAL | Status: AC
  Administered 2012-12-11: 2 mg via ORAL
  Filled 2012-12-11: qty 2

## 2012-12-11 NOTE — ED Provider Notes (Signed)
History     CSN: [id number]  Arrival date & time 12/11/12  1118   First MD Initiated Contact with Patient 12/11/12 1128      Chief Complaint  Patient presents with  . Seizures    (Consider location/radiation/quality/duration/timing/severity/associated sxs/prior treatment) HPI Comments: Referring reports that patient was sleeping in bed and started having a seizure. When EMS arrived she was sleepy and has become more alert upon arrival. She is positive for urinary incontinence  Patient is a 32 y.o. female presenting with seizures. The history is provided by the patient and the EMS personnel.  Seizures Seizure activity on arrival: no   Seizure type:  Grand mal Preceding symptoms comment:  None Initial focality:  None Episode characteristics: generalized shaking and unresponsiveness   Postictal symptoms: confusion and somnolence   Return to baseline: yes   Severity:  Moderate Timing:  Once Number of seizures this episode:  1 Progression:  Resolved Context: pregnancy   Context: not sleeping less, not drug use, not fever and medical compliance   Context comment:  Patient states that she has not missed any Keppra doses Recent head injury:  No recent head injuries PTA treatment:  None History of seizures: yes   Seizure control level:  Poorly controlled Current therapy:  Levetiracetam Compliance with current therapy:  Good   Past Medical History  Diagnosis Date  . Migraines     would be rx'd Percacet for relief  . Seizures 05/2011    unsure what started seizures;Neurology appt on 05/30/12;on Kepra 2000mg  bid;was on Topramax 50mg  tid  . Bipolar affective disorder     no meds currently;has been years since taking meds  . Infection     Yeast inf;not frequent  . Anemia     has taken iron supplements in the past  . Yeast infection   . H/O varicella   . Wears glasses   . Hypertension     Pt states she had pre-eclampsia w last pregnancy   . Pregnancy induced hypertension      Past Surgical History  Procedure Laterality Date  . Cesarean section    . Wisdom tooth extraction      All 4 removed  . Tonsillectomy      During high school years    Family History  Problem Relation Age of Onset  . Cancer Mother     Ovarian  . Diabetes Mother   . Hypertension Father   . Cirrhosis Father   . Hypertension Brother   . Asthma Son     History  Substance Use Topics  . Smoking status: Never Smoker   . Smokeless tobacco: Never Used  . Alcohol Use: No    OB History   Grav Para Term Preterm Abortions TAB SAB Ect Mult Living   2 1 1       1       Review of Systems  Constitutional: Negative for fever.  Genitourinary: Negative for dysuria and flank pain.  Musculoskeletal:       States that she constantly has left sided body pain  Neurological: Positive for seizures.  All other systems reviewed and are negative.    Allergies  Review of patient's allergies indicates no known allergies.  Home Medications   Current Outpatient Rx  Name  Route  Sig  Dispense  Refill  . acetaminophen (TYLENOL) 325 MG tablet   Oral   Take 650 mg by mouth 2 (two) times daily as needed. For pain         .  labetalol (NORMODYNE) 200 MG tablet   Oral   Take 1 tablet (200 mg total) by mouth 3 (three) times daily.   90 tablet   3   . Levetiracetam 750 MG TB24   Oral   Take 3 tablets (2,250 mg total) by mouth every morning.   90 tablet   1   . levETIRAcetam 750 MG TB24   Oral   Take 2 tablets (1,500 mg total) by mouth daily at 10 pm.   60 tablet   1   . Prenatal Vit-Fe Fumarate-FA (PRENATAL MULTIVITAMIN) TABS   Oral   Take 1 tablet by mouth daily.         . promethazine (PHENERGAN) 12.5 MG tablet   Oral   Take 12.5 mg by mouth every 4 (four) hours as needed. nausea         . promethazine (PHENERGAN) 12.5 MG tablet   Oral   Take 1 tablet (12.5 mg total) by mouth every 4 (four) hours as needed.   30 tablet   0   . VITAMIN D, CHOLECALCIFEROL, PO    Oral   Take 4,000 Units by mouth daily.           LMP 03/28/2012  Physical Exam  Nursing note and vitals reviewed. Constitutional: She is oriented to person, place, and time. She appears well-developed and well-nourished. No distress.  Morbidly obese  HENT:  Head: Normocephalic and atraumatic.  Mouth/Throat: Oropharynx is clear and moist.  Eyes: Conjunctivae and EOM are normal. Pupils are equal, round, and reactive to light.  Neck: Normal range of motion. Neck supple.  Cardiovascular: Normal rate, regular rhythm and intact distal pulses.   No murmur heard. Pulmonary/Chest: Effort normal and breath sounds normal. No respiratory distress. She has no wheezes. She has no rales.  Abdominal: Soft. She exhibits no distension. There is no tenderness. There is no rebound and no guarding.  Gravid abd up to the xiphoid  Musculoskeletal: Normal range of motion. She exhibits tenderness. She exhibits no edema.  Tenderness with palpation of the entire left upper and lower ext but full ROM and normal pulses and sensation  Neurological: She is alert and oriented to person, place, and time.  Skin: Skin is warm and dry. No rash noted. No erythema.  Psychiatric: She has a normal mood and affect. Her behavior is normal.    ED Course  Procedures (including critical care time)  Labs Reviewed  COMPREHENSIVE METABOLIC PANEL - Abnormal; Notable for the following:    Creatinine, Ser 0.47 (*)    Albumin 2.3 (*)    Total Bilirubin 0.2 (*)    All other components within normal limits  CBC WITH DIFFERENTIAL - Abnormal; Notable for the following:    Hemoglobin 10.4 (*)    HCT 32.9 (*)    MCV 68.3 (*)    MCH 21.6 (*)    RDW 18.2 (*)    All other components within normal limits  URINALYSIS, ROUTINE W REFLEX MICROSCOPIC   No results found.   1. Elevated hemoglobin A1c   2. Seizure disorder   3. Bipolar disorder   4. Migraines   5. Morbid obesity with BMI of 60.0-69.9, adult   6. Previous  cesarean section   7. UTI (urinary tract infection) during pregnancy       MDM   Patient is currently [redacted] weeks pregnant with a known seizure disorder taking Keppra had a seizure today while she was lying in bed. Per her boyfriend she had  no injuries. Patient denies missing any doses of Keppra. She was recently seen for similar. Currently she is complaining of left-sided pain which she states is common after her seizure but was occurring prior to having the seizure. She's also complaining of intermittent abdominal cramping. Bedside ultrasound shows a normal fetal heart rate with decreased fetal movement.  Preeclampsia with last pregnancy. Rapid response nurse contacted and patient placed on toco monitoring. Will check a UA to ensure patient is not developing a UTI causing breakthrough seizures. Patient has a normal blood pressure and no prior history of preeclampsia.   1:22 PM CBC, CMP stable UA is pending.  Spoke with Dr. Emelda Fear who is the OB at [facility] and request that pt be transferred there for further care and possible c-section.     Gwyneth Sprout, MD 12/11/12 1322

## 2012-12-11 NOTE — Progress Notes (Signed)
Pt sitting on the couch stating that MD encouraged her to sit up out of the bed

## 2012-12-11 NOTE — Progress Notes (Signed)
Pt transferred to [facility] for further monitoring; report given to [facility] over phone.  RROB spoke with Dr Emelda Fear who requested a transfer; Dr Emelda Fear and Dr Anitra Lauth spoke to each other about pt/plan of care/transfer.

## 2012-12-11 NOTE — ED Notes (Signed)
Pt was sleeping when boyfriend reports seeing pt having seizure. Was lethargic, sleepy on ems arrival. Became more alert. Positive for urinary incontinence.

## 2012-12-11 NOTE — ED Notes (Signed)
AOZ:[id number]<VH> Expected date:12/11/12<BR> Expected time:11:15 AM<BR> Means of arrival:Ambulance<BR> Comments:<BR> Seizures

## 2012-12-11 NOTE — H&P (Signed)
See details included in cosign of dr Westley Hummer note.

## 2012-12-11 NOTE — ED Notes (Signed)
keppra requested from pharmacy

## 2012-12-11 NOTE — Progress Notes (Addendum)
Dr. Emelda Fear taking pt in the hallway to ambulate. MD discussed with pt the importance of moving and ambulating for exercise.  Md encouraging pt to ambulate 15 min several times a day.

## 2012-12-11 NOTE — Progress Notes (Signed)
Pt sitting up in the bed eating dinner

## 2012-12-11 NOTE — Progress Notes (Signed)
RROB in for pt 36 6/7 pregnant with seizures; pt has seizure disorder, currently taking keppra; first part of strip(fhr) not in obix b/c of technical difficulties; started saving strip at 1144; put on monitor at 1134=150 with ave ltv and 10x10 accels 1134-1144.
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
## 2012-12-11 NOTE — H&P (Signed)
[name] [name]  ADMISSION H&P History of Present Illness: [name] [name] is a 32 y.o. G2P1001 at [redacted]w[redacted]d (by LMP consistent with 35 week u/s) admitted for seizure.  Patient's seizure witnessed by a friend who called EMS. Patient does not remember anything. She does note that she had urinated involuntarily when she woke up. EMS reports patient in postictal state, pt taken to [facility]. Pt states that she aches all over and her left side is especially sore after seizure. Pt was hospitalized 2/11 for seizure. She had neurology consult, and Keppra dose was increased at that time to 2250 AM and 1500 mg PM. Pt states she is taking her sz med twice a day and never misses a dose. However, she reported to pharmacy at [facility] that she takes once a day. 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 Pt states this is the first known seizure since discharge; however she lives alone and does not always know when she has a seizure. Note: patient is poor historian and not particularly cooperative with history.  Her seizures started in August 2012. She lives alone; her 9-yr-old son lives with her mother. Pt reports she cannot walk due to obesity and pain, which is worsening with pregnancy. She gets to her bathroom which is just off her bedroom. She is looking for a place to live in [place] closer to her family but has not found a suitable place yet.   She originally received prenatal care at [facility] but was transferred to [facility] due to high risk pregnancy. Her first appointment is scheduled for 12/19/12.   She is on labetalol 200 mg TID for HTN (not clear if GHTN or chronic). She also had an elevated A1C (6.3) earlier in pregnancy (11/18/12) but is not taking any medications for diabetes and has not had a glucose tolerance test. She had a normal quad screen and anatomy scan, and u/s on 11/29/12 showed EFW in 50% and normal fluid.  She has a  hx of c-section with her first child.  Fetal presentation is cephalic and by u/s 11/29/12.   Patient  Patient reports the fetal movement as decreased . Patient reports uterine contraction  activity as none. Patient reports  vaginal bleeding as none. Patient describes fluid per vagina as None.  Patient Active Problem List  Diagnosis  . Seizure disorder  . Bipolar disorder  . Migraines  . Morbid obesity with BMI of 60.0-69.9, adult  . Previous cesarean section  . UTI (urinary tract infection) during pregnancy  . Elevated hemoglobin A1c   Past Medical History: Past Medical History  Diagnosis Date  . Migraines     would be rx'd Percacet for relief  . Seizures 05/2011    unsure what started seizures;Neurology appt on 05/30/12;on Kepra 2000mg  bid;was on Topramax 50mg  tid  . Bipolar affective disorder     no meds currently;has been years since taking meds  . Infection     Yeast inf;not frequent  . Anemia     has taken iron supplements in the past  . Yeast infection   . H/O varicella   . Wears glasses   . Hypertension     Pt states she had pre-eclampsia w last pregnancy   . Pregnancy induced hypertension    Past Surgical History: Past Surgical History  Procedure Laterality Date  . Cesarean section    . Wisdom tooth extraction  All 4 removed  . Tonsillectomy      During high school years   Obstetrical History: OB History   Grav Para Term Preterm Abortions TAB SAB Ect Mult Living   2 1 1       1      Social History: History   Social History  . Marital Status: Single    Spouse Name: N/A    Number of Children: 1  . Years of Education: 13   Occupational History  .      Pt is disabled   Social History Main Topics  . Smoking status: Never Smoker   . Smokeless tobacco: Never Used  . Alcohol Use: No  . Drug Use: No  . Sexually Active: Yes -- Female partner(s)    Birth Control/ Protection: None   Other Topics Concern  . Not on file   Social History  Narrative  . No narrative on file    Family History: Family History  Problem Relation Age of Onset  . Cancer Mother     Ovarian  . Diabetes Mother   . Hypertension Father   . Cirrhosis Father   . Hypertension Brother   . Asthma Son     Allergies: No Known Allergies  Medications Prior to Admission  Medication Sig Dispense Refill  . acetaminophen (TYLENOL) 325 MG tablet Take 650 mg by mouth 2 (two) times daily as needed. For pain      . labetalol (NORMODYNE) 200 MG tablet Take 1 tablet (200 mg total) by mouth 3 (three) times daily.  90 tablet  3  . Levetiracetam (KEPPRA XR) 750 MG TB24 Take 3,000 mg by mouth every morning.      . Prenatal Vit-Fe Fumarate-FA (PRENATAL MULTIVITAMIN) TABS Take 1 tablet by mouth daily.      . promethazine (PHENERGAN) 12.5 MG tablet Take 12.5 mg by mouth every 4 (four) hours as needed. nausea      . VITAMIN D, CHOLECALCIFEROL, PO Take 4,000 Units by mouth daily.       Review of Systems - + headache, nausea, weakness, general malaise. Negative for fever/chills, vaginal bleeding, loss of fluid. Not sure if baby is moving - has not felt since last night.  Vitals:   Filed Vitals:   12/11/12 1435  BP: 140/65  Pulse: 95  Temp: 98.6 F (37 C)  Resp: 22    Physical Examination:  General:  Morbidly obese, drowsy appearing, no acute distress. Respond to questions. Hear:  RRR, no murmur Lungs:  CTAB Abdomen: obese, gravid, fundal height not easily assessed given body habitus. Non-tender. Normal BS.  Extremities: 1+ pitting edema bilateral lower extremities. Mild tenderness left shoulder. Neuro: somnolent but appropriate answers to questions  Fetal Monitoring:  Baseline: 145 bpm, Variability: moderate, Accelerations: Reactive, Decelerations: occasional variable  Labs:  Results for orders placed during the hospital encounter of 12/11/12 (from the past 24 hour(s))  COMPREHENSIVE METABOLIC PANEL     Status: Abnormal   Collection Time    12/11/12 11:40  AM      Result Value Range   Sodium 135  135 - 145 mEq/L   Potassium 3.9  3.5 - 5.1 mEq/L   Chloride 101  96 - 112 mEq/L   CO2 23  19 - 32 mEq/L   Glucose, Bld 98  70 - 99 mg/dL   BUN 6  6 - 23 mg/dL   Creatinine, Ser 1.61 (*) 0.50 - 1.10 mg/dL   Calcium 9.0  8.4 - 09.6 mg/dL  Total Protein 6.6  6.0 - 8.3 g/dL   Albumin 2.3 (*) 3.5 - 5.2 g/dL   AST 14  0 - 37 U/L   ALT 12  0 - 35 U/L   Alkaline Phosphatase 106  39 - 117 U/L   Total Bilirubin 0.2 (*) 0.3 - 1.2 mg/dL   GFR calc non Af Amer >90  >90 mL/min   GFR calc Af Amer >90  >90 mL/min  CBC WITH DIFFERENTIAL     Status: Abnormal   Collection Time    12/11/12 11:40 AM      Result Value Range   WBC 9.5  4.0 - 10.5 K/uL   RBC 4.82  3.87 - 5.11 MIL/uL   Hemoglobin 10.4 (*) 12.0 - 15.0 g/dL   HCT 16.1 (*) 09.6 - 04.5 %   MCV 68.3 (*) 78.0 - 100.0 fL   MCH 21.6 (*) 26.0 - 34.0 pg   MCHC 31.6  30.0 - 36.0 g/dL   RDW 40.9 (*) 81.1 - 91.4 %   Platelets 384  150 - 400 K/uL   Neutrophils Relative 70  43 - 77 %   Lymphocytes Relative 24  12 - 46 %   Monocytes Relative 5  3 - 12 %   Eosinophils Relative 1  0 - 5 %   Basophils Relative 0  0 - 1 %   Neutro Abs 6.6  1.7 - 7.7 K/uL   Lymphs Abs 2.3  0.7 - 4.0 K/uL   Monocytes Absolute 0.5  0.1 - 1.0 K/uL   Eosinophils Absolute 0.1  0.0 - 0.7 K/uL   Basophils Absolute 0.0  0.0 - 0.1 K/uL   Smear Review MORPHOLOGY UNREMARKABLE    URINALYSIS, ROUTINE W REFLEX MICROSCOPIC     Status: None   Collection Time    12/11/12 12:42 PM      Result Value Range   Color, Urine YELLOW  YELLOW   APPearance CLEAR  CLEAR   Specific Gravity, Urine 1.018  1.005 - 1.030   pH 7.0  5.0 - 8.0   Glucose, UA NEGATIVE  NEGATIVE mg/dL   Hgb urine dipstick NEGATIVE  NEGATIVE   Bilirubin Urine NEGATIVE  NEGATIVE   Ketones, ur NEGATIVE  NEGATIVE mg/dL   Protein, ur NEGATIVE  NEGATIVE mg/dL   Urobilinogen, UA 0.2  0.0 - 1.0 mg/dL   Nitrite NEGATIVE  NEGATIVE   Leukocytes, UA NEGATIVE  NEGATIVE      Imaging Studies: none   ASSESSMENT/PLAN: 32 y.o.  G2P1001 at [redacted]w[redacted]d with -  Seizure disorder:  Discussed with Dr. Amada Jupiter (neurology) - increase Keppra to 2250 XR BID. He states this is a reasonable dose for late pregnancy, as needs increase by around 40%. Pt was on 3000 mg daily at start of pregnancy. There is some question about compliance but no way to verify what dose patient is taking. Per neuro, needs to have prolonged video EEG monitoring off medications AFTER delivery to confirm seizure disorder. - Elevated Hgb A1C - CBG fasting and postprandial, diabetes education - HTN - continue labetalol - Morbid obesity - nutrition consult. PT evaluation for mobility, safety at home, ability to care for self and baby. - previous c/s - schedule repeat section, records from prior section - S/w consult for home safety evaluation, social support system, ability to care for baby  Napoleon Form, MD

## 2012-12-11 NOTE — ED Notes (Signed)
OB rapid response RN at bedside. 

## 2012-12-11 NOTE — MAU Note (Addendum)
Transfer from [facility] via [facility] @ 1415. "I had a seizure this morning.  I called the ambulance to take me to the emergency room when I woke up after the seizure.  I haven't felt the baby move much today.  I do remember the baby moving last night."

## 2012-12-12 ENCOUNTER — Other Ambulatory Visit (HOSPITAL_COMMUNITY): Payer: Self-pay | Admitting: Obstetrics & Gynecology

## 2012-12-12 LAB — GLUCOSE, CAPILLARY: Glucose-Capillary: 80 mg/dL (ref 70–99)

## 2012-12-12 MED ORDER — LEVETIRACETAM ER 500 MG PO TB24: 2500.0000 mg | ORAL_TABLET | Freq: Every morning | ORAL | Status: DC

## 2012-12-12 MED ORDER — LEVETIRACETAM ER 500 MG PO TB24: 2000.0000 mg | ORAL_TABLET | Freq: Every day | ORAL | Status: DC

## 2012-12-12 NOTE — Progress Notes (Signed)
Discharge instructions reviewed with pt. Receptive.

## 2012-12-12 NOTE — Progress Notes (Signed)
CSW received consult.  CSW spoke with Dr. Emelda Fear to see if there is any new reason to see patient as CSW is aware that patient has been seen by a CSW at least 2 times prior to this admission.  He states no new concerns for this admission.  Numerous staff are concerned with patient's ability to care for self and for baby.  CSW met briefly with patient who states she does not have any needs for CSW.  She reports that FOB is not at all helpful or supportive.  She has gotten a few items for baby (mostly clothes) and state he has not helped her.  She plans to move back home to [place], [place] (not [facility], which is documented in other places) when she can find an apartment there.  She states that her son lives there with her mother.  She says he lived in [place] with her until she "got sick" with the seizures in August of 2012.  CSW asked if she has been linked with [facility] [employer] and she states she has never heard of that.  CSW explained that they provide case management services in the community and that she may qualify due to having Medicare.  She states willingness for referral.  CSW made referral to [facility], but since patient does not have a PCP in [facility], she is not eligible for their services at this time.  Burnard Hawthorne informed CSW that patient will qualify for case management services through [facility] since patient has both Medicare and Medicaid.  CSW spoke with [place] Duke/[facility] who states unfortunately, patient is not eligible for their services because patient has [employer] Part I and would require Part II to be eligible.  CSW updated Lanette Gaines/[facility] Case Manager regarding this information.  CSW will continue to follow regarding concerns, especially when baby is born.

## 2012-12-12 NOTE — Progress Notes (Signed)
Discharge ur review completed.

## 2012-12-12 NOTE — Discharge Summary (Signed)
Physician Discharge Summary  Patient ID: [name] [name] MRN: [id number] DOB/AGE: 1981/04/23 32 y.o.  Admit date: 12/11/2012 Discharge date: 12/12/2012  Admission Diagnoses: 36.[redacted] weeks EGA, seizure disorder, morbid obesity, HTN, non- compliance  Discharge Diagnoses: same Active Problems:   * No active hospital problems. *   Discharged Condition: good  Hospital Course: She was admitted for overnight observation. She had resolution of her symptoms and fetal monitoring was reassuring. Her dose of Keppra was adjusted and follow up was stressed. She has not been getting the recommended fetal testing.   Consults: None. Social Work was called but they spoke with Dr. Emelda Fear and told him that they have seen her several times and don't have anything new to add.  Significant Diagnostic Studies: labs: all normal, except mild expected anemia  Treatments: change in Keppra dose  Discharge Exam: Blood pressure 143/79, pulse 100, temperature 98 F (36.7 C), temperature source Oral, resp. rate 20, height 5\' 1"  (1.549 m), weight 160.12 kg (353 lb), last menstrual period 03/28/2012, SpO2 94.00%. General appearance: alert Resp: clear to auscultation bilaterally Cardio: regular rate and rhythm, S1, S2 normal, no murmur, click, rub or gallop GI: soft, non-tender; bowel sounds normal; no masses,  no organomegaly NST- reactive Disposition: 01-Home or Self Care   Future Appointments Provider Department Dept Phone   12/19/2012 9:00 AM Catalina Antigua, MD [facility] [phone]       Medication List    TAKE these medications       acetaminophen 325 MG tablet  Commonly known as:  TYLENOL  Take 650 mg by mouth 2 (two) times daily as needed. For pain     labetalol 200 MG tablet  Commonly known as:  NORMODYNE  Take 1 tablet (200 mg total) by mouth 3 (three) times daily.     levETIRAcetam 500 MG 24 hr tablet  Commonly known as:  KEPPRA XR  Take 4 tablets (2,000 mg total) by mouth at  bedtime.     levETIRAcetam 500 MG 24 hr tablet  Commonly known as:  KEPPRA XR  Take 5 tablets (2,500 mg total) by mouth every morning.     prenatal multivitamin Tabs  Take 1 tablet by mouth daily.     promethazine 12.5 MG tablet  Commonly known as:  PHENERGAN  Take 12.5 mg by mouth every 4 (four) hours as needed. nausea     VITAMIN D (CHOLECALCIFEROL) PO  Take 4,000 Units by mouth daily.           Follow-up Information   Follow up with [facility]. Schedule an appointment as soon as possible for a visit in 4 days. (For NST. Call today.)    Contact information:   [address] [place] [address]       Signed: Allie Bossier 12/12/2012, 9:34 AM

## 2012-12-15 ENCOUNTER — Telehealth: Payer: Self-pay | Admitting: Family Medicine

## 2012-12-15 ENCOUNTER — Other Ambulatory Visit: Payer: Medicare Other

## 2012-12-15 NOTE — Telephone Encounter (Signed)
Called patient about missing appointment. Left message

## 2012-12-19 ENCOUNTER — Encounter: Payer: Self-pay | Admitting: Obstetrics and Gynecology

## 2012-12-26 ENCOUNTER — Inpatient Hospital Stay (HOSPITAL_COMMUNITY): Payer: Medicare Other | Admitting: Anesthesiology

## 2012-12-26 ENCOUNTER — Encounter (HOSPITAL_COMMUNITY): Payer: Self-pay | Admitting: Anesthesiology

## 2012-12-26 ENCOUNTER — Encounter (HOSPITAL_COMMUNITY)
Admission: AD | Disposition: A | Discharge status: Home / Self Care | Payer: Self-pay | Source: Ambulatory Visit | Attending: Obstetrics & Gynecology

## 2012-12-26 ENCOUNTER — Encounter (HOSPITAL_COMMUNITY)
Admission: RE | Admit: 2012-12-26 | Discharge: 2012-12-26 | Disposition: A | Discharge status: Home / Self Care | Payer: Medicare Other | Source: Ambulatory Visit | Attending: Obstetrics and Gynecology | Admitting: Obstetrics and Gynecology

## 2012-12-26 ENCOUNTER — Encounter (HOSPITAL_COMMUNITY): Payer: Self-pay | Admitting: *Deleted

## 2012-12-26 ENCOUNTER — Inpatient Hospital Stay (HOSPITAL_COMMUNITY)
Admission: AD | Admit: 2012-12-26 | Discharge: 2012-12-29 | DRG: 765 | Disposition: A | Discharge status: Home / Self Care | Payer: Medicare Other | Source: Ambulatory Visit | Attending: Obstetrics & Gynecology | Admitting: Obstetrics & Gynecology

## 2012-12-26 DIAGNOSIS — R7309 Other abnormal glucose: Secondary | ICD-10-CM

## 2012-12-26 DIAGNOSIS — F319 Bipolar disorder, unspecified: Secondary | ICD-10-CM

## 2012-12-26 DIAGNOSIS — G40909 Epilepsy, unspecified, not intractable, without status epilepticus: Secondary | ICD-10-CM | POA: Diagnosis present

## 2012-12-26 DIAGNOSIS — O34219 Maternal care for unspecified type scar from previous cesarean delivery: Secondary | ICD-10-CM

## 2012-12-26 DIAGNOSIS — Z6841 Body Mass Index (BMI) 40.0 and over, adult: Secondary | ICD-10-CM

## 2012-12-26 DIAGNOSIS — O99354 Diseases of the nervous system complicating childbirth: Secondary | ICD-10-CM

## 2012-12-26 DIAGNOSIS — O99214 Obesity complicating childbirth: Secondary | ICD-10-CM | POA: Diagnosis present

## 2012-12-26 DIAGNOSIS — IMO0002 Reserved for concepts with insufficient information to code with codable children: Secondary | ICD-10-CM | POA: Diagnosis present

## 2012-12-26 DIAGNOSIS — G43909 Migraine, unspecified, not intractable, without status migrainosus: Secondary | ICD-10-CM

## 2012-12-26 DIAGNOSIS — O2343 Unspecified infection of urinary tract in pregnancy, third trimester: Secondary | ICD-10-CM

## 2012-12-26 DIAGNOSIS — E669 Obesity, unspecified: Secondary | ICD-10-CM | POA: Diagnosis present

## 2012-12-26 DIAGNOSIS — Z98891 History of uterine scar from previous surgery: Secondary | ICD-10-CM

## 2012-12-26 HISTORY — DX: Gestational diabetes mellitus in pregnancy, unspecified control: O24.419

## 2012-12-26 LAB — CBC
MCH: 21.3 pg — ABNORMAL LOW (ref 26.0–34.0)
MCHC: 30.8 g/dL (ref 30.0–36.0)
MCV: 69.2 fL — ABNORMAL LOW (ref 78.0–100.0)
Platelets: 360 10*3/uL (ref 150–400)
RBC: 5.16 MIL/uL — ABNORMAL HIGH (ref 3.87–5.11)
RDW: 18.4 % — ABNORMAL HIGH (ref 11.5–15.5)

## 2012-12-26 LAB — COMPREHENSIVE METABOLIC PANEL
ALT: 10 U/L (ref 0–35)
AST: 17 U/L (ref 0–37)
Albumin: 2.5 g/dL — ABNORMAL LOW (ref 3.5–5.2)
CO2: 25 mEq/L (ref 19–32)
Calcium: 9.2 mg/dL (ref 8.4–10.5)
Creatinine, Ser: 0.61 mg/dL (ref 0.50–1.10)
GFR calc non Af Amer: >90 mL/min (ref >90)
Sodium: 135 mEq/L (ref 135–145)
Total Protein: 6.6 g/dL (ref 6.0–8.3)

## 2012-12-26 SURGERY — Surgical Case
Anesthesia: Spinal | Site: Abdomen | Wound class: Clean Contaminated

## 2012-12-26 MED ORDER — MEPERIDINE HCL 25 MG/ML IJ SOLN: 6.2500 mg | INTRAMUSCULAR | Status: DC | PRN

## 2012-12-26 MED ORDER — MEPERIDINE HCL 25 MG/ML IJ SOLN
INTRAMUSCULAR | Status: DC | PRN
  Administered 2012-12-26: 25 mg via INTRAVENOUS

## 2012-12-26 MED ORDER — NALBUPHINE HCL 10 MG/ML IJ SOLN: 5.0000 mg | INTRAMUSCULAR | Status: DC | PRN

## 2012-12-26 MED ORDER — SCOPOLAMINE 1 MG/3DAYS TD PT72
MEDICATED_PATCH | TRANSDERMAL | Status: AC
  Filled 2012-12-26: qty 1

## 2012-12-26 MED ORDER — ZOLPIDEM TARTRATE 5 MG PO TABS: 5.0000 mg | ORAL_TABLET | Freq: Every evening | ORAL | Status: DC | PRN

## 2012-12-26 MED ORDER — DIPHENHYDRAMINE HCL 50 MG/ML IJ SOLN: 12.5000 mg | INTRAMUSCULAR | Status: DC | PRN

## 2012-12-26 MED ORDER — KETOROLAC TROMETHAMINE 30 MG/ML IJ SOLN: 30.0000 mg | Freq: Four times a day (QID) | INTRAMUSCULAR | Status: AC | PRN

## 2012-12-26 MED ORDER — TETANUS-DIPHTH-ACELL PERTUSSIS 5-2.5-18.5 LF-MCG/0.5 IM SUSP
0.5000 mL | Freq: Once | INTRAMUSCULAR | Status: DC
  Filled 2012-12-26: qty 0.5

## 2012-12-26 MED ORDER — MORPHINE SULFATE (PF) 0.5 MG/ML IJ SOLN
INTRAMUSCULAR | Status: DC | PRN
  Administered 2012-12-26: .15 mg via INTRATHECAL

## 2012-12-26 MED ORDER — ONDANSETRON HCL 4 MG/2ML IJ SOLN: 4.0000 mg | INTRAMUSCULAR | Status: DC | PRN

## 2012-12-26 MED ORDER — SIMETHICONE 80 MG PO CHEW: 80.0000 mg | CHEWABLE_TABLET | ORAL | Status: DC | PRN

## 2012-12-26 MED ORDER — LEVETIRACETAM ER 500 MG PO TB24
2500.0000 mg | ORAL_TABLET | Freq: Every morning | ORAL | Status: DC
  Administered 2012-12-27 – 2012-12-29 (×3): 2500 mg via ORAL
  Filled 2012-12-26 (×4): qty 5

## 2012-12-26 MED ORDER — LACTATED RINGERS IV SOLN
INTRAVENOUS | Status: DC
  Administered 2012-12-27 (×2): via INTRAVENOUS

## 2012-12-26 MED ORDER — CITRIC ACID-SODIUM CITRATE 334-500 MG/5ML PO SOLN
ORAL | Status: AC
  Filled 2012-12-26: qty 15

## 2012-12-26 MED ORDER — PHENYLEPHRINE HCL 10 MG/ML IJ SOLN
INTRAMUSCULAR | Status: DC | PRN
  Administered 2012-12-26: 80 ug via INTRAVENOUS
  Administered 2012-12-26 (×3): 40 ug via INTRAVENOUS

## 2012-12-26 MED ORDER — IBUPROFEN 600 MG PO TABS
600.0000 mg | ORAL_TABLET | Freq: Four times a day (QID) | ORAL | Status: DC
  Administered 2012-12-27 – 2012-12-29 (×11): 600 mg via ORAL
  Filled 2012-12-26 (×12): qty 1

## 2012-12-26 MED ORDER — PRENATAL MULTIVITAMIN CH
1.0000 | ORAL_TABLET | Freq: Every day | ORAL | Status: DC
  Administered 2012-12-27 – 2012-12-29 (×3): 1 via ORAL
  Filled 2012-12-26 (×2): qty 1

## 2012-12-26 MED ORDER — ONDANSETRON HCL 4 MG/2ML IJ SOLN: 4.0000 mg | Freq: Three times a day (TID) | INTRAMUSCULAR | Status: DC | PRN

## 2012-12-26 MED ORDER — OXYCODONE-ACETAMINOPHEN 5-325 MG PO TABS
1.0000 | ORAL_TABLET | ORAL | Status: DC | PRN
  Administered 2012-12-27: 2 via ORAL
  Administered 2012-12-27 – 2012-12-28 (×2): 1 via ORAL
  Administered 2012-12-28 (×3): 2 via ORAL
  Administered 2012-12-29 (×2): 1 via ORAL
  Filled 2012-12-26: qty 2
  Filled 2012-12-26 (×3): qty 1
  Filled 2012-12-26 (×3): qty 2
  Filled 2012-12-26: qty 1
  Filled 2012-12-26: qty 2
  Filled 2012-12-26: qty 1

## 2012-12-26 MED ORDER — FENTANYL CITRATE 0.05 MG/ML IJ SOLN
INTRAMUSCULAR | Status: DC | PRN
  Administered 2012-12-26: 75 ug via INTRAVENOUS
  Administered 2012-12-26: 25 ug via INTRATHECAL

## 2012-12-26 MED ORDER — BUPIVACAINE IN DEXTROSE 0.75-8.25 % IT SOLN
INTRATHECAL | Status: DC | PRN
  Administered 2012-12-26: 1.4 mL via INTRATHECAL

## 2012-12-26 MED ORDER — LABETALOL HCL 200 MG PO TABS
200.0000 mg | ORAL_TABLET | Freq: Three times a day (TID) | ORAL | Status: DC
  Administered 2012-12-27: 200 mg via ORAL
  Filled 2012-12-26 (×2): qty 1

## 2012-12-26 MED ORDER — LACTATED RINGERS IV SOLN
INTRAVENOUS | Status: DC
  Administered 2012-12-26: 17:00:00 via INTRAVENOUS

## 2012-12-26 MED ORDER — WITCH HAZEL-GLYCERIN EX PADS: 1.0000 "application " | MEDICATED_PAD | CUTANEOUS | Status: DC | PRN

## 2012-12-26 MED ORDER — BUPIVACAINE HCL (PF) 0.5 % IJ SOLN
INTRAMUSCULAR | Status: DC | PRN
  Administered 2012-12-26: 30 mL

## 2012-12-26 MED ORDER — FAMOTIDINE IN NACL 20-0.9 MG/50ML-% IV SOLN
INTRAVENOUS | Status: AC
  Administered 2012-12-26: 20 mg via INTRAVENOUS
  Filled 2012-12-26: qty 50

## 2012-12-26 MED ORDER — ONDANSETRON HCL 4 MG/2ML IJ SOLN
INTRAMUSCULAR | Status: DC | PRN
  Administered 2012-12-26: 4 mg via INTRAVENOUS

## 2012-12-26 MED ORDER — NALOXONE HCL 1 MG/ML IJ SOLN: 1.0000 ug/kg/h | INTRAVENOUS | Status: DC | PRN

## 2012-12-26 MED ORDER — EPHEDRINE SULFATE 50 MG/ML IJ SOLN
INTRAMUSCULAR | Status: DC | PRN
  Administered 2012-12-26: 10 mg via INTRAVENOUS

## 2012-12-26 MED ORDER — SODIUM CHLORIDE 0.9 % IJ SOLN: 3.0000 mL | INTRAMUSCULAR | Status: DC | PRN

## 2012-12-26 MED ORDER — FENTANYL CITRATE 0.05 MG/ML IJ SOLN: 25.0000 ug | INTRAMUSCULAR | Status: DC | PRN

## 2012-12-26 MED ORDER — DIBUCAINE 1 % RE OINT: 1.0000 "application " | TOPICAL_OINTMENT | RECTAL | Status: DC | PRN

## 2012-12-26 MED ORDER — OXYTOCIN 10 UNIT/ML IJ SOLN
40.0000 [IU] | INTRAVENOUS | Status: DC | PRN
  Administered 2012-12-26: 40 [IU] via INTRAVENOUS

## 2012-12-26 MED ORDER — MAGNESIUM SULFATE BOLUS VIA INFUSION
4.0000 g | Freq: Once | INTRAVENOUS | Status: AC
  Administered 2012-12-26: 4 g via INTRAVENOUS
  Filled 2012-12-26: qty 500

## 2012-12-26 MED ORDER — DIPHENHYDRAMINE HCL 25 MG PO CAPS
25.0000 mg | ORAL_CAPSULE | ORAL | Status: DC | PRN
  Administered 2012-12-27: 25 mg via ORAL
  Filled 2012-12-26 (×3): qty 1

## 2012-12-26 MED ORDER — DIPHENHYDRAMINE HCL 50 MG/ML IJ SOLN: 25.0000 mg | INTRAMUSCULAR | Status: DC | PRN

## 2012-12-26 MED ORDER — DEXTROSE 5 % IV SOLN: 3.0000 g | INTRAVENOUS | Status: DC

## 2012-12-26 MED ORDER — ONDANSETRON HCL 4 MG PO TABS: 4.0000 mg | ORAL_TABLET | ORAL | Status: DC | PRN

## 2012-12-26 MED ORDER — NALBUPHINE SYRINGE 5 MG/0.5 ML
INJECTION | INTRAMUSCULAR | Status: AC
  Administered 2012-12-26: 5 mg via SUBCUTANEOUS
  Filled 2012-12-26: qty 1

## 2012-12-26 MED ORDER — METOCLOPRAMIDE HCL 5 MG/ML IJ SOLN: 10.0000 mg | Freq: Three times a day (TID) | INTRAMUSCULAR | Status: DC | PRN

## 2012-12-26 MED ORDER — LANOLIN HYDROUS EX OINT: 1.0000 "application " | TOPICAL_OINTMENT | CUTANEOUS | Status: DC | PRN

## 2012-12-26 MED ORDER — SCOPOLAMINE 1 MG/3DAYS TD PT72
1.0000 | MEDICATED_PATCH | Freq: Once | TRANSDERMAL | Status: AC
  Administered 2012-12-26: 1.5 mg via TRANSDERMAL

## 2012-12-26 MED ORDER — NALOXONE HCL 0.4 MG/ML IJ SOLN: 0.4000 mg | INTRAMUSCULAR | Status: DC | PRN

## 2012-12-26 MED ORDER — KETOROLAC TROMETHAMINE 30 MG/ML IJ SOLN
30.0000 mg | Freq: Four times a day (QID) | INTRAMUSCULAR | Status: AC | PRN
  Administered 2012-12-27: 30 mg via INTRAVENOUS
  Filled 2012-12-26: qty 1

## 2012-12-26 MED ORDER — MENTHOL 3 MG MT LOZG: 1.0000 | LOZENGE | OROMUCOSAL | Status: DC | PRN

## 2012-12-26 MED ORDER — DEXTROSE 5 % IV SOLN
3.0000 g | INTRAVENOUS | Status: DC | PRN
  Administered 2012-12-26: 3 g via INTRAVENOUS

## 2012-12-26 MED ORDER — SENNOSIDES-DOCUSATE SODIUM 8.6-50 MG PO TABS
2.0000 | ORAL_TABLET | Freq: Every day | ORAL | Status: DC
  Administered 2012-12-26 – 2012-12-28 (×3): 2 via ORAL

## 2012-12-26 MED ORDER — MAGNESIUM SULFATE 40 G IN LACTATED RINGERS - SIMPLE
2.0000 g/h | INTRAVENOUS | Status: AC
  Administered 2012-12-26 – 2012-12-27 (×2): 2 g/h via INTRAVENOUS
  Filled 2012-12-26 (×2): qty 500

## 2012-12-26 MED ORDER — SIMETHICONE 80 MG PO CHEW
80.0000 mg | CHEWABLE_TABLET | Freq: Three times a day (TID) | ORAL | Status: DC
  Administered 2012-12-26 – 2012-12-29 (×9): 80 mg via ORAL

## 2012-12-26 MED ORDER — OXYTOCIN 40 UNITS IN LACTATED RINGERS INFUSION - SIMPLE MED: 62.5000 mL/h | INTRAVENOUS | Status: AC

## 2012-12-26 MED ORDER — IBUPROFEN 600 MG PO TABS: 600.0000 mg | ORAL_TABLET | Freq: Four times a day (QID) | ORAL | Status: DC | PRN

## 2012-12-26 MED ORDER — DIPHENHYDRAMINE HCL 25 MG PO CAPS
25.0000 mg | ORAL_CAPSULE | Freq: Four times a day (QID) | ORAL | Status: DC | PRN
  Administered 2012-12-28: 25 mg via ORAL

## 2012-12-26 SURGICAL SUPPLY — 32 items
BARRIER ADHS 3X4 INTERCEED (GAUZE/BANDAGES/DRESSINGS) IMPLANT
CLOTH BEACON ORANGE TIMEOUT ST (SAFETY) ×2 IMPLANT
CONTAINER PREFILL 10% NBF 15ML (MISCELLANEOUS) IMPLANT
DRAPE LG THREE QUARTER DISP (DRAPES) ×2 IMPLANT
DRSG OPSITE POSTOP 4X10 (GAUZE/BANDAGES/DRESSINGS) ×2 IMPLANT
DURAPREP 26ML APPLICATOR (WOUND CARE) ×4 IMPLANT
ELECT REM PT RETURN 9FT ADLT (ELECTROSURGICAL) ×2
ELECTRODE REM PT RTRN 9FT ADLT (ELECTROSURGICAL) ×1 IMPLANT
GLOVE BIO SURGEON STRL SZ 6.5 (GLOVE) ×2 IMPLANT
GOWN STRL REIN XL XLG (GOWN DISPOSABLE) ×4 IMPLANT
KIT ABG SYR 3ML LUER SLIP (SYRINGE) IMPLANT
NEEDLE HYPO 25X5/8 SAFETYGLIDE (NEEDLE) IMPLANT
NEEDLE SPNL 18GX3.5 QUINCKE PK (NEEDLE) ×2 IMPLANT
NS IRRIG 1000ML POUR BTL (IV SOLUTION) ×2 IMPLANT
PACK C SECTION WH (CUSTOM PROCEDURE TRAY) ×2 IMPLANT
PAD OB MATERNITY 4.3X12.25 (PERSONAL CARE ITEMS) ×2 IMPLANT
SLEEVE SCD COMPRESS KNEE MED (MISCELLANEOUS) IMPLANT
SUT PDS AB 0 CTX 60 (SUTURE) ×2 IMPLANT
SUT VIC AB 0 CT1 27 (SUTURE)
SUT VIC AB 0 CT1 27XBRD ANBCTR (SUTURE) IMPLANT
SUT VIC AB 0 CT1 36 (SUTURE) IMPLANT
SUT VIC AB 2-0 CT1 27 (SUTURE) ×1
SUT VIC AB 2-0 CT1 TAPERPNT 27 (SUTURE) ×1 IMPLANT
SUT VIC AB 2-0 CTX 36 (SUTURE) ×4 IMPLANT
SUT VIC AB 3-0 CT1 27 (SUTURE) ×1
SUT VIC AB 3-0 CT1 TAPERPNT 27 (SUTURE) ×1 IMPLANT
SUT VIC AB 3-0 SH 27 (SUTURE)
SUT VIC AB 3-0 SH 27X BRD (SUTURE) IMPLANT
SYR 30ML LL (SYRINGE) ×2 IMPLANT
TOWEL OR 17X24 6PK STRL BLUE (TOWEL DISPOSABLE) ×6 IMPLANT
TRAY FOLEY CATH 14FR (SET/KITS/TRAYS/PACK) ×2 IMPLANT
WATER STERILE IRR 1000ML POUR (IV SOLUTION) ×2 IMPLANT

## 2012-12-26 NOTE — Op Note (Addendum)
12/26/2012  7:22 PM  PATIENT:  [name] [name]  32 y.o. female  PRE-OPERATIVE DIAGNOSIS:  previous cesarean section, pre eclampsia, morbid obesity (BMI 67)  POST-OPERATIVE DIAGNOSIS:  Same  PROCEDURE:  Procedure(s): Repeat cesarean section with delivery of baby (N/A)  FINDINGS: Living female infant with Apgars of 8 & 9, intact placenta with 3 vessel cord, normal appearing adnexa  SURGEON:  Surgeon(s) and Role:    * Allie Bossier, MD - Primary  PHYSICIAN ASSISTANT:   ASSISTANTS: Merrilee Seashore, MD   ANESTHESIA:   spinal  EBL:  Total I/O In: 400 [I.V.:400] Out: -   BLOOD ADMINISTERED:none  DRAINS: none   LOCAL MEDICATIONS USED:  MARCAINE     SPECIMEN:  Source of Specimen:  cord blood  DISPOSITION OF SPECIMEN:  PATHOLOGY  COUNTS:  YES  TOURNIQUET:  * No tourniquets in log *  DICTATION: .Dragon Dictation  PLAN OF CARE: Admit to inpatient   PATIENT DISPOSITION:  PACU - hemodynamically stable.   The risks, benefits, and alternatives of surgery were explained, understood, accepted. Consents were signed. All questions were answered. In the operating room spinal anesthesia was applied without complication. Her abdomen and vagina were prepped and draped in the usual sterile fashion. A Foley catheter was placed, and it drained clear urine throughout the case. Timeout procedure was done. After adequate anesthesia was assured 30 mL for 0.5% Marcaine was injected into the subcutaneous tissue approximately 2 cm above the symphysis pubis. An incision was made here. The incision was carried down through the subcutaneous tissue to the fascia. The fascia was scored the midline and extended bilaterally. Bleeding encountered was cauterized with the Bovie. The middle 20% of the rectus muscles were separated in a transverse fashion using electrosurgical technique. Excellent hemostasis was maintained. The peritoneum was entered with hemostats. Peritoneal incision was extended bilaterally with the  Bovie. The bladder blade was placed. A transverse incision was made on the well-developed lower uterine segment. The uterine incision was extended with bandage scissors on each side. Amniotomy was performed with a hemostat. Clear fluid was noted. The baby was delivered from a vertex presentation using a Kiwi vaccuum to assist. The pressure was in the green zone and only used for 1 pull. The mouth and nostrils were suctioned prior to delivery of the shoulders. A loose nuchal cord was removed. The baby's cord was clamped and cut and it was transferred to the NICU personnel for routine care. The placenta was delivered intact with traction. The uterus was left in situ, and the interior was cleaned with a dry lap sponge. The uterine incision was closed with 2 layersof 2-0 Vicryl running locking suture, the second layer imbricating the first. Excellent hemostasis was noted. By tilting the uterus each side was able to visualize the adnexa, and they were normal. The rectus fascia and rectus muscles were noted be hemostatic as well. The fascia was closed with a 0 PDS loop suture in a running nonlocking fashion. No defects were palpable. The subcutaneous tissue was irrigated, clean, and dried. A subcuticular closure was done with a 3-0 Vicryl suture. Steri-Strips are placed. Excellent cosmetic results were obtained. She was taken to the recovery room in stable condition. She tolerated the procedure well. Please note that due to her morbid obesity the difficulty of this case was increased and the time was increased as well.

## 2012-12-26 NOTE — MAU Note (Signed)
Sent for PIH eval from clinic; scheduled for c-section tomorrow;

## 2012-12-26 NOTE — Anesthesia Preprocedure Evaluation (Addendum)
Anesthesia Evaluation  Patient identified by MRN, date of birth, ID band Patient awake    Reviewed: Allergy & Precautions, H&P , NPO status , Patient's Chart, lab work & pertinent test results  Airway Mallampati: III TM Distance: >3 FB Neck ROM: full    Dental no notable dental hx. (+) Teeth Intact   Pulmonary neg pulmonary ROS,  breath sounds clear to auscultation  Pulmonary exam normal       Cardiovascular hypertension, negative cardio ROS  Rhythm:regular Rate:Normal  PIH   Neuro/Psych  Headaches, Seizures -, Well Controlled,  PSYCHIATRIC DISORDERS Bipolar Disorder    GI/Hepatic negative GI ROS, Neg liver ROS,   Endo/Other  negative endocrine ROSdiabetes, Well Controlled, GestationalMorbid obesity  Renal/GU negative Renal ROS  negative genitourinary   Musculoskeletal   Abdominal Normal abdominal exam  (+) + obese,   Peds  Hematology  (+) anemia ,   Anesthesia Other Findings   Reproductive/Obstetrics (+) Pregnancy Previous C/Section                          Anesthesia Physical Anesthesia Plan  ASA: III  Anesthesia Plan: Spinal   Post-op Pain Management:    Induction:   Airway Management Planned: Natural Airway  Additional Equipment:   Intra-op Plan:   Post-operative Plan:   Informed Consent: I have reviewed the patients History and Physical, chart, labs and discussed the procedure including the risks, benefits and alternatives for the proposed anesthesia with the patient or authorized representative who has indicated his/her understanding and acceptance.   Dental advisory given  Plan Discussed with: Anesthesiologist, CRNA and Surgeon  Anesthesia Plan Comments:        Anesthesia Quick Evaluation

## 2012-12-26 NOTE — Pre-Procedure Instructions (Signed)
Pt arrived for PAT appt. With c/o headache, blurred vision and pain "all over". Feet and legs very edematous, BP 170/88. Call to Dr Macon Large, order given to send patient to MAU for evaluation. Pt escorted to MAU and their staff made aware.

## 2012-12-26 NOTE — Anesthesia Postprocedure Evaluation (Signed)
  Anesthesia Post Note  Patient: [name] [name]  Procedure(s) Performed: Procedure(s) (LRB): Repeat cesarean section with delivery of baby (N/A)  Anesthesia type: Spinal  Patient location: PACU  Post pain: Pain level controlled  Post assessment: Post-op Vital signs reviewed  Last Vitals:  Filed Vitals:   12/26/12 1945  BP: 140/122  Pulse: 95  Temp:   Resp: 33    Post vital signs: Reviewed  Level of consciousness: awake  Complications: No apparent anesthesia complications

## 2012-12-26 NOTE — H&P (Signed)
[name] [name] is a 32 y.o. female presenting for [facility] labs after having elevated BPs at a pre-op evaluation. She states that she has had a headache for a few weeks that is now intensifying. SHe is having blurry vision and scotoma intermittently for the same few weeks. She denies vaginal bleeding, discharge, contractions, and decreased fetal movement. She denies RUQ pain, fever, chills, sweats, dysuria, and changes in bowel habits from normal (alternates from diarrhea to constipation. Has had some mild dyspnea for the last couple of days she equates with anxiety that's accompanied by chest discomfort. She was previously seen by [facility] [place] [facility] and has not seen anyone except the MAU in the last 3 months. She is compliant with her seizure meds and denies recent seizure activity. She has been having increased swelling for a few days.   History OB History   Grav Para Term Preterm Abortions TAB SAB Ect Mult Living   2 1 1       1      Past Medical History  Diagnosis Date  . Migraines     would be rx'd Percacet for relief  . Seizures 05/2011    unsure what started seizures;Neurology appt on 05/30/12;on Kepra 2000mg  bid;was on Topramax 50mg  tid  . Bipolar affective disorder     no meds currently;has been years since taking meds  . Infection     Yeast inf;not frequent  . Anemia     has taken iron supplements in the past  . Yeast infection   . H/O varicella   . Wears glasses   . Hypertension     Pt states she had pre-eclampsia w last pregnancy   . Pregnancy induced hypertension   . Gestational diabetes    Past Surgical History  Procedure Laterality Date  . Cesarean section    . Wisdom tooth extraction      All 4 removed  . Tonsillectomy      During high school years   Family History: family history includes Asthma in her son; Cancer in her mother; Cirrhosis in her father; Diabetes in her mother; and Hypertension in her brother and father. Social History:  reports that she has never  smoked. She has never used smokeless tobacco. She reports that she does not drink alcohol or use illicit drugs.   Prenatal Transfer Tool  Maternal Diabetes: No Genetic Screening: Normal Maternal Ultrasounds/Referrals: Normal Fetal Ultrasounds or other Referrals:  None Maternal Substance Abuse:  No Significant Maternal Medications:  Meds include: Other:  Significant Maternal Lab Results:  None Other Comments:  Keppra, Lebetalol  ROS Per HPI    Blood pressure 168/101, resp. rate 20, last menstrual period 03/28/2012, SpO2 99.00%. Exam Physical Exam  Gen: NAD, alert, cooperative with exam HEENT: NCAT, EOMI, PERRL CV: RRR, good S1/S2, no murmur Resp: CTABL, no wheezes, non-labored Abd: Soft pregnant abdomen, no tenderness to palpation Ext: 1+ pitting edema Neuro: Alert and oriented, No gross deficits  FHT: baseline 140, moderate variability, accels present, decels absent Toco: No contractions visualized  Prenatal labs: ABO, Rh: --/--/O POS (02/10 2253) Antibody: NEG (02/10 2253) Rubella: 354.1 (08/06 1602) RPR: NON REACTIVE (01/29 1230)  HBsAg: NEGATIVE (08/06 1602)  HIV: NON REACTIVE (08/06 1602)  GBS:   unknown  Assessment/Plan: 31 G2P1001 here with pre eclampsia and Hx of Chronic HTN, Seizure disorder and elevated A1C - Pre-eclampsia with normal Urine creatinine. Has severe HTN and neurologic symptoms - Category 1 fetal strip - Scheduled for CS tomorrow, Will proceed with section  today - Will treat with Magnesium per usual protocol,  3 g ancef perioperatively.  - Seizure disorder at baseline, continue keppra - Anticipate caesarian delivery   Kevin Fenton 12/26/2012, 2:56 PM

## 2012-12-26 NOTE — Anesthesia Procedure Notes (Signed)
Spinal  Patient location during procedure: OR Start time: 12/26/2012 6:15 PM Staffing Anesthesiologist: Tangala Wiegert A. Performed by: anesthesiologist  Preanesthetic Checklist Completed: patient identified, site marked, surgical consent, pre-op evaluation, timeout performed, IV checked, risks and benefits discussed and monitors and equipment checked Spinal Block Patient position: sitting Prep: site prepped and draped and DuraPrep Patient monitoring: heart rate, cardiac monitor, continuous pulse ox and blood pressure Approach: midline Location: L3-4 Injection technique: single-shot Needle Needle type: Sprotte  Needle gauge: 24 G Needle length: 9 cm Needle insertion depth: 8 cm Assessment Sensory level: T4 Additional Notes Patient tolerated procedure well. Adequate sensory level.

## 2012-12-26 NOTE — Transfer of Care (Signed)
Immediate Anesthesia Transfer of Care Note  Patient: [name] [name]  Procedure(s) Performed: Procedure(s): Repeat cesarean section with delivery of baby (N/A)  Patient Location: PACU  Anesthesia Type:Spinal  Level of Consciousness: awake, alert  and oriented  Airway & Oxygen Therapy: Patient Spontanous Breathing  Post-op Assessment: Report given to PACU RN and Post -op Vital signs reviewed and stable  Post vital signs: Reviewed and stable  Complications: No apparent anesthesia complications

## 2012-12-27 ENCOUNTER — Encounter (HOSPITAL_COMMUNITY): Payer: Self-pay | Admitting: Obstetrics & Gynecology

## 2012-12-27 ENCOUNTER — Inpatient Hospital Stay (HOSPITAL_COMMUNITY)
Admission: RE | Admit: 2012-12-27 | Payer: Medicare Other | Source: Ambulatory Visit | Admitting: Obstetrics and Gynecology

## 2012-12-27 ENCOUNTER — Encounter (HOSPITAL_COMMUNITY)
Admission: AD | Disposition: A | Discharge status: Home / Self Care | Payer: Self-pay | Source: Ambulatory Visit | Attending: Obstetrics & Gynecology

## 2012-12-27 LAB — CBC
HCT: 32 % — ABNORMAL LOW (ref 36.0–46.0)
MCV: 68.5 fL — ABNORMAL LOW (ref 78.0–100.0)
RBC: 4.67 MIL/uL (ref 3.87–5.11)
WBC: 11 10*3/uL — ABNORMAL HIGH (ref 4.0–10.5)

## 2012-12-27 SURGERY — Surgical Case
Anesthesia: Regional

## 2012-12-27 MED ORDER — KETOROLAC TROMETHAMINE 30 MG/ML IJ SOLN: 30.0000 mg | Freq: Once | INTRAMUSCULAR | Status: DC

## 2012-12-27 MED ORDER — ACETAMINOPHEN 10 MG/ML IV SOLN
1000.0000 mg | Freq: Four times a day (QID) | INTRAVENOUS | Status: DC
  Administered 2012-12-27: 1000 mg via INTRAVENOUS
  Filled 2012-12-27 (×3): qty 100

## 2012-12-27 MED ORDER — TETANUS-DIPHTH-ACELL PERTUSSIS 5-2.5-18.5 LF-MCG/0.5 IM SUSP
0.5000 mL | Freq: Once | INTRAMUSCULAR | Status: DC
  Filled 2012-12-27: qty 0.5

## 2012-12-27 NOTE — Progress Notes (Signed)
Verbal report to Kennon Rounds, RN on MBW.  Given physical assessment, social history,  plan of care discussed.   VSS.

## 2012-12-27 NOTE — Anesthesia Postprocedure Evaluation (Signed)
  Anesthesia Post-op Note  Patient: [name] [name]  Procedure(s) Performed: Procedure(s): Repeat cesarean section with delivery of baby (N/A)  Patient Location: A-ICU  Anesthesia Type:Spinal  Level of Consciousness: awake, alert , oriented and patient cooperative  Airway and Oxygen Therapy: Patient Spontanous Breathing  Post-op Pain: mild  Post-op Assessment: Patient's Cardiovascular Status Stable and Respiratory Function Stable Remains on MgSO4  Post-op Vital Signs: stable  Complications: No apparent anesthesia complications

## 2012-12-27 NOTE — Telephone Encounter (Signed)
Note to close enc. [name] [name], [name]  

## 2012-12-27 NOTE — Progress Notes (Signed)
UR chart review completed.  

## 2012-12-27 NOTE — Telephone Encounter (Signed)
Note to close encounter. [name] [name], [name]  

## 2012-12-27 NOTE — Progress Notes (Signed)
BP cuff off while pt holding baby

## 2012-12-27 NOTE — Progress Notes (Signed)
Subjective: Postpartum Day 1: Cesarean Delivery Patient reports incisional pain and tolerating PO.    Objective: Vital signs in last 24 hours: Temp:  [97.7 F (36.5 C)-98.4 F (36.9 C)] 98.1 F (36.7 C) (03/11 0349) Pulse Rate:  [80-103] 80 (03/11 0700) Resp:  [16-33] 22 (03/11 0600) BP: (102-173)/(52-122) 124/68 mmHg (03/11 0700) SpO2:  [97 %-100 %] 97 % (03/11 0700) Weight:  [160.12 kg (353 lb)-161.027 kg (355 lb)] 161.027 kg (355 lb) (03/10 2125)  Physical Exam:  General: alert Lochia: appropriate Uterine Fundus: firm Incision: pressure dressing removed, honeycomb dressing c/d/i DVT Evaluation: No evidence of DVT seen on physical exam.   Recent Labs  12/26/12 1235 12/27/12 0545  HGB 11.0* 9.8*  HCT 35.7* 32.0*    Assessment/Plan: Status post Cesarean section. Doing well postoperatively.  Continue current care Discontinue magnesium at the 24 pp point and transfer to the pp floor.  DOVE,MYRA C. 12/27/2012, 7:38 AM

## 2012-12-28 NOTE — Progress Notes (Addendum)
Subjective: Postpartum Day 2: Cesarean Delivery, 24 hr s/p Magnesium protocol at 1800 12/28/12 Patient reports incisional pain and tolerating PO, total body discomfort and pain with ambulation   Objective: Vital signs in last 24 hours: Temp:  [97.7 F (36.5 C)-99 F (37.2 C)] 97.7 F (36.5 C) (03/12 0542) Pulse Rate:  [90-109] 99 (03/12 0542) Resp:  [18-24] 18 (03/12 0542) BP: (115-153)/(54-87) 149/80 mmHg (03/12 0542) SpO2:  [94 %-100 %] 96 % (03/12 0542)  Physical Exam:  General: alert Cardiac: RRR, no M/G/R Lungs: CLAB, no W/R/R Abdomen: obese Lochia: appropriate Uterine Fundus: firm Incision: pressure dressing removed, honeycomb dressing c/d/i DVT Evaluation: No evidence of DVT seen on physical exam.   Recent Labs  12/26/12 1235 12/27/12 0545  HGB 11.0* 9.8*  HCT 35.7* 32.0*    Assessment/Plan: Status post Cesarean section. Doing well postoperatively.  Continue current care D/c foley catheter Up out of bed  Gregor Hams 12/28/2012, 9:15 AM  Patient seen and examined this morning. Agree with above assessment and plan Encouraged ambulation D/C foley

## 2012-12-28 NOTE — Clinical Social Work Maternal (Signed)
Clinical Social Work Department  PSYCHOSOCIAL ASSESSMENT - MATERNAL/CHILD  12/28/2012  Patient: [name] [name],[name] Account Number: [id number] Admit Date: 12/26/2012  Childs Name:  [name] [name] [name]   Clinical Social Worker: TEDRA SLADE, LCSW Date/Time: 12/27/2012 01:53 PM  Date Referred: 12/28/2012  Referral source   CN    Referred reason   Behavioral Health Issues   Other referral source:  I: FAMILY / HOME ENVIRONMENT  Child's legal guardian: PARENT  Guardian - Name  Guardian - Age  Guardian - Address   [name] [name] [name]  31  [address] [place], [place] [address]   [name] [name] [name]     Other household support members/support persons  Other support:  Family in [place], [place]   II PSYCHOSOCIAL DATA  Information Source: Patient Interview  Financial and Community Resources  Employment:  [employer]   Financial resources: Medicaid  If Medicaid - County: [place]  Other   WIC   Section 8   School / Grade:  Maternity Care Coordinator / Child Services Coordination / Early Interventions: Cultural issues impacting care:  III STRENGTHS  Strengths   Adequate Resources   Home prepared for Child (including basic supplies)   Supportive family/friends   Strength comment:  IV RISK FACTORS AND CURRENT PROBLEMS  Current Problem: YES  Risk Factor & Current Problem  Patient Issue  Family Issue  Risk Factor / Current Problem Comment   Mental Illness  Y  N  Hx of bipolar disorder, depression/anxiety & SI   Adjustment to Illness  Y  N  New onset seizure activity   V SOCIAL WORK ASSESSMENT  CSW referral received to assess pt's history of mental illnesses & current social situation. This CSW is familiar with this pt's situation from previous admissions. She is currently living alone. She has a son who lives in [place], [place] with family. Pt told CSW that her son went to live with her family once her health started to decline & increased seizure activity. According to pt, she started to experience seizures August  2012. She told CSW that she would have seizures "frequently at first but now not like that." When she has the seizures, she passes out & can't remember anything that happened. Pt has not had a seizure since 12/11/12, that she is aware of. Psych consult noted 11/18/12. CSW expressed concern about the pt's inability to care for the infant independently, since she continues to have seizures. Pt nodded in agreement & verbalized understanding of concern. She told CSW that she didn't want any thing to happen to the infant, either. Pt states that FOB agreed to be in the home with her upon discharge however pt states he in unreliable. Pt named a God brother who lives here that may be a support person. Her main support system lives in [place], . She would like to move closer to them but can't afford to at this time. CSW asked if a family member would allow her to stay at their home to offer additional support but pt states, she doesn't like staying with anyone. She acknowledge being diagnosed with bipolar disorder years ago. She took medication but not sure how long or name of medicine. She acknowledges some depressed moods during the pregnancy due to "lack of support." She denies any SI. Staff has expressed concern about the pt's lack of interest in bonding, as baby has spent a lot of time in [place]. Due to pt's medical condition & limited support system, CSW reported case to CPS. Pt is aware of   report. Pt states she has all the necessary supplies for the infant. CSW will continue to assist until discharge.   VI SOCIAL WORK PLAN  Social Work Plan   No Further Intervention Required / No Barriers to Discharge   Type of pt/family education:  If child protective services report - county: [place]  If child protective services report - date: 12/28/2012  Information/referral to community resources comment:  Other social work plan:   

## 2012-12-29 MED ORDER — OXYCODONE-ACETAMINOPHEN 5-325 MG PO TABS: 1.0000 | ORAL_TABLET | ORAL | Status: DC | PRN

## 2012-12-29 MED ORDER — IBUPROFEN 600 MG PO TABS: 600.0000 mg | ORAL_TABLET | Freq: Four times a day (QID) | ORAL | Status: DC

## 2012-12-29 NOTE — Discharge Summary (Signed)
Attestation of Attending Supervision of Advanced Practitioner (CNM/NP): Evaluation and management procedures were performed by the Advanced Practitioner under my supervision and collaboration.  I have reviewed the Advanced Practitioner's note and chart, and I agree with the management and plan.  Avonna Iribe 12/29/2012 11:42 PM

## 2012-12-29 NOTE — Discharge Summary (Signed)
Obstetric Discharge Summary Reason for Admission: preeclampsia, repeat C/S Prenatal Procedures: NST Intrapartum Procedures: cesarean: low cervical, transverse Postpartum Procedures: none Complications-Operative and Postpartum: none Hemoglobin  Date Value Range Status  12/27/2012 9.8* 12.0 - 15.0 g/dL Final     HCT  Date Value Range Status  12/27/2012 32.0* 36.0 - 46.0 % Final  Hospital Course: Admission: [name] [name] is a 32 y.o. female presenting for [facility] labs after having elevated BPs at a pre-op evaluation. She states that she has had a headache for a few weeks that is now intensifying. SHe is having blurry vision and scotoma intermittently for the same few weeks. She denies vaginal bleeding, discharge, contractions, and decreased fetal movement. She denies RUQ pain, fever, chills, sweats, dysuria, and changes in bowel habits from normal (alternates from diarrhea to constipation. Has had some mild dyspnea for the last couple of days she equates with anxiety that's accompanied by chest discomfort. She [facility] seen by [facility] and has not seen anyone except the [facility] in the last 3 months. She is compliant with her seizure meds and denies recent seizure activity. She has been having increased swelling for a few days Delivery:PRE-OPERATIVE DIAGNOSIS: previous cesarean section, pre eclampsia, morbid obesity (BMI 67)  POST-OPERATIVE DIAGNOSIS: Same  PROCEDURE: Procedure(s):  Repeat cesarean section with delivery of baby (N/A)  FINDINGS: Living female infant with Apgars of 8 & 9, intact placenta with 3 vessel cord, normal appearing adnexa. Has done well after surgery, though slow to get moving and reluctant to go home. Vitals signs have been stable:  Filed Vitals:   12/28/12 1200 12/28/12 1556 12/28/12 2100 12/29/12 0655  BP: 136/78 144/85 133/79 136/77  Pulse: 100 99 106 97  Temp: 97.9 F (36.6 C) 97.6 F (36.4 C) 99.1 F (37.3 C) 98.4 F (36.9 C)  TempSrc: Oral Oral  Oral Oral  Resp: 18 19 20 20   Height:      Weight:      SpO2: 97%      Incision clean and intact. Homans negative. No headaches. Voiding and passing stool without difficulty.    Physical Exam:  General: alert and no distress Lochia: appropriate Uterine Fundus: firm Incision: healing well, no significant drainage DVT Evaluation: No evidence of DVT seen on physical exam.                            Does have bilateral edema, c/w preeclampsia  Discharge Diagnoses: Term Pregnancy-delivered and Preelampsia  Discharge Information: Date: 12/29/2012 Activity: pelvic rest Diet: routine Medications: PNV, Ibuprofen and Percocet Condition: stable Instructions: refer to practice specific booklet Discharge to: home Follow-up Information   Follow up with [facility] In 2 weeks.   Contact information:   [phone]       Newborn Data: Live born female  Birth Weight: 7 lb 15.3 oz (3610 g) APGAR: 9, 9  Home with mother.  [facility] 12/29/2012, 11:42 AM

## 2013-01-04 ENCOUNTER — Telehealth (HOSPITAL_COMMUNITY): Payer: Self-pay | Admitting: *Deleted

## 2013-01-04 NOTE — Telephone Encounter (Signed)
Resolve episode 

## 2013-01-12 ENCOUNTER — Encounter: Payer: Self-pay | Admitting: *Deleted

## 2013-01-17 ENCOUNTER — Encounter: Payer: Self-pay | Admitting: Obstetrics & Gynecology

## 2013-01-17 ENCOUNTER — Ambulatory Visit: Payer: Medicare Other | Admitting: Obstetrics & Gynecology

## 2013-01-17 VITALS — BP 138/88 | HR 89 | Temp 97.4°F | Resp 20 | Ht 61.0 in | Wt 358.1 lb

## 2013-01-17 DIAGNOSIS — Z9889 Other specified postprocedural states: Secondary | ICD-10-CM

## 2013-01-17 NOTE — Progress Notes (Signed)
Pt states she has been having frequent headaches since last week. She has tried Tylenol and ibuprofen without relief. She has not used the Percocet because it makes her sleepy.  Pt has not looked at or checked her incision but denies pain or problems.

## 2013-01-17 NOTE — Progress Notes (Signed)
Subjective:     Patient ID: [name] [name], female   DOB: August 02, 1981, 32 y.o.   MRN: [id number]  HPI Asked by nurse to see pt. Pt presents for incision check .  C/o HA.  No visual changes. No s/sx of preeclampsia   Review of Systems     Objective:   Physical Exam BP 138/88  Pulse 89  Temp(Src) 97.4 F (36.3 C) (Oral)  Resp 20  Ht 5\' 1"  (1.549 m)  Wt 358 lb 1.6 oz (162.433 kg)  BMI 67.7 kg/m2  Breastfeeding? No Abd: morbid obesity Incision well healed, clean and dry Ext: 1+ pitting edema     Assessment:     HA- suspect sinus congestion  Incision looks good.    Plan:     F/u for post partum check as scheduled

## 2013-01-17 NOTE — Patient Instructions (Addendum)

## 2013-01-24 ENCOUNTER — Emergency Department (HOSPITAL_COMMUNITY)
Admission: EM | Admit: 2013-01-24 | Discharge: 2013-01-24 | Disposition: A | Discharge status: Home / Self Care | Payer: Medicare Other | Attending: Emergency Medicine | Admitting: Emergency Medicine

## 2013-01-24 ENCOUNTER — Encounter (HOSPITAL_COMMUNITY): Payer: Self-pay | Admitting: Emergency Medicine

## 2013-01-24 DIAGNOSIS — Z8619 Personal history of other infectious and parasitic diseases: Secondary | ICD-10-CM | POA: Insufficient documentation

## 2013-01-24 DIAGNOSIS — R569 Unspecified convulsions: Secondary | ICD-10-CM

## 2013-01-24 DIAGNOSIS — D649 Anemia, unspecified: Secondary | ICD-10-CM | POA: Insufficient documentation

## 2013-01-24 DIAGNOSIS — G40909 Epilepsy, unspecified, not intractable, without status epilepticus: Secondary | ICD-10-CM | POA: Insufficient documentation

## 2013-01-24 DIAGNOSIS — Z8632 Personal history of gestational diabetes: Secondary | ICD-10-CM | POA: Insufficient documentation

## 2013-01-24 DIAGNOSIS — Z79899 Other long term (current) drug therapy: Secondary | ICD-10-CM | POA: Insufficient documentation

## 2013-01-24 DIAGNOSIS — R4182 Altered mental status, unspecified: Secondary | ICD-10-CM | POA: Insufficient documentation

## 2013-01-24 DIAGNOSIS — I10 Essential (primary) hypertension: Secondary | ICD-10-CM | POA: Insufficient documentation

## 2013-01-24 DIAGNOSIS — Z789 Other specified health status: Secondary | ICD-10-CM | POA: Insufficient documentation

## 2013-01-24 DIAGNOSIS — G43909 Migraine, unspecified, not intractable, without status migrainosus: Secondary | ICD-10-CM | POA: Insufficient documentation

## 2013-01-24 DIAGNOSIS — Z8659 Personal history of other mental and behavioral disorders: Secondary | ICD-10-CM | POA: Insufficient documentation

## 2013-01-24 DIAGNOSIS — R51 Headache: Secondary | ICD-10-CM | POA: Insufficient documentation

## 2013-01-24 MED ORDER — OXYCODONE-ACETAMINOPHEN 5-325 MG PO TABS
2.0000 | ORAL_TABLET | Freq: Once | ORAL | Status: AC
  Administered 2013-01-24: 2 via ORAL
  Filled 2013-01-24: qty 2

## 2013-01-24 MED ORDER — LEVETIRACETAM 500 MG PO TABS
500.0000 mg | ORAL_TABLET | Freq: Once | ORAL | Status: AC
  Administered 2013-01-24: 500 mg via ORAL
  Filled 2013-01-24: qty 1

## 2013-01-24 MED ORDER — LORAZEPAM 2 MG/ML IJ SOLN
1.0000 mg | Freq: Once | INTRAMUSCULAR | Status: AC
  Administered 2013-01-24: 1 mg via INTRAVENOUS
  Filled 2013-01-24: qty 1

## 2013-01-24 MED ORDER — SODIUM CHLORIDE 0.9 % IV SOLN
500.0000 mg | Freq: Once | INTRAVENOUS | Status: DC
  Filled 2013-01-24: qty 5

## 2013-01-24 NOTE — ED Notes (Addendum)
Pt here with ems for s/p witness Sz  By boyfriend lasting 4-5 min. Pt is 1 mont postpartum

## 2013-01-24 NOTE — ED Notes (Signed)
Bed:WA11<BR> Expected date:<BR> Expected time:<BR> Means of arrival:<BR> Comments:<BR> ems

## 2013-01-24 NOTE — ED Notes (Signed)
unsuccessful in getting iv  By nurse and iv team EDMD aware Keppra to be given po

## 2013-01-24 NOTE — ED Provider Notes (Signed)
History     CSN: [id number]  Arrival date & time 01/24/13  7829   First MD Initiated Contact with Patient 01/24/13 [phone]      Chief Complaint  Patient presents with  . Seizures    (Consider location/radiation/quality/duration/timing/severity/associated sxs/prior treatment) HPI Comments: 32 year old morbidly obese female with a history of seizure disorder who presents with a recurrent seizure. The patient has no recollection of the events, states that she was up intermittently through the night with her newborn, has had subacute to chronic headaches and states that she has a mild headache at this time. She has denied any chest pain shortness of breath nausea vomiting or any other complaints leading up to the seizures. The seizures were witnessed by family member,   9:30 AM, the patient's family member has arrived and stated that he witnessed 3 minute tonic seizure with altered mental status lasting 3 minutes, gradually resolved, since that time has improved back to her normal mental status. He states that he has had multiple seizures in the past, he thinks that the new medication that she is taking calls Keppra is working better for her overall as the seizures are less frequent and less intense. He states the seizure happened while the patient was in bed, there was no fall or head injury.  The history is provided by the patient and the EMS personnel.    Past Medical History  Diagnosis Date  . Migraines     would be rx'd Percacet for relief  . Seizures 05/2011    unsure what started seizures;Neurology appt on 05/30/12;on Kepra 2000mg  bid;was on Topramax 50mg  tid  . Bipolar affective disorder     no meds currently;has been years since taking meds  . Infection     Yeast inf;not frequent  . Anemia     has taken iron supplements in the past  . Yeast infection   . H/O varicella   . Wears glasses   . Hypertension     Pt states she had pre-eclampsia w last pregnancy   . Pregnancy induced  hypertension   . Gestational diabetes     Past Surgical History  Procedure Laterality Date  . Cesarean section    . Wisdom tooth extraction      All 4 removed  . Tonsillectomy      During high school years  . Cesarean section N/A 12/26/2012    Procedure: Repeat cesarean section with delivery of baby;  Surgeon: Allie Bossier, MD;  Location: [facility];  Service: Obstetrics;  Laterality: N/A;    Family History  Problem Relation Age of Onset  . Cancer Mother     Ovarian  . Diabetes Mother   . Hypertension Father   . Cirrhosis Father   . Hypertension Brother   . Asthma Son     History  Substance Use Topics  . Smoking status: Never Smoker   . Smokeless tobacco: Never Used  . Alcohol Use: No    OB History   Grav Para Term Preterm Abortions TAB SAB Ect Mult Living   2 2 2       2       Review of Systems  All other systems reviewed and are negative.    Allergies  Review of patient's allergies indicates no known allergies.  Home Medications   Current Outpatient Rx  Name  Route  Sig  Dispense  Refill  . acetaminophen (TYLENOL) 325 MG tablet   Oral   Take 650 mg by mouth  2 (two) times daily as needed. For pain         . ibuprofen (ADVIL,MOTRIN) 600 MG tablet   Oral   Take 1 tablet (600 mg total) by mouth every 6 (six) hours.   30 tablet   1   . labetalol (NORMODYNE) 200 MG tablet   Oral   Take 1 tablet (200 mg total) by mouth 3 (three) times daily.   90 tablet   3   . levETIRAcetam (KEPPRA XR) 500 MG 24 hr tablet   Oral   Take 5 tablets (2,500 mg total) by mouth every morning.   100 tablet   1   . Prenatal Vit-Fe Fumarate-FA (PRENATAL MULTIVITAMIN) TABS   Oral   Take 1 tablet by mouth daily.         Marland Kitchen VITAMIN D, CHOLECALCIFEROL, PO   Oral   Take 4,000 Units by mouth daily.           BP 152/92  Pulse 107  Temp(Src) 98.2 F (36.8 C) (Oral)  Resp 20  SpO2 96%  Breastfeeding? Unknown  Physical Exam  Nursing note and vitals  reviewed. Constitutional: She appears well-developed and well-nourished. No distress.  HENT:  Head: Normocephalic and atraumatic.  Mouth/Throat: Oropharynx is clear and moist. No oropharyngeal exudate.  No lacerations to the tongue, no intraoral injury, dentition intact. No injury to the head, no hematomas, contusions abrasions or lacerations, no malocclusion, no trismus or torticollis  Eyes: Conjunctivae and EOM are normal. Pupils are equal, round, and reactive to light. Right eye exhibits no discharge. Left eye exhibits no discharge. No scleral icterus.  Neck: Normal range of motion. Neck supple. No JVD present. No thyromegaly present.  Cardiovascular: Normal rate, regular rhythm, normal heart sounds and intact distal pulses.  Exam reveals no gallop and no friction rub.   No murmur heard. Pulmonary/Chest: Effort normal and breath sounds normal. No respiratory distress. She has no wheezes. She has no rales.  Abdominal: Soft. Bowel sounds are normal. She exhibits no distension and no mass. There is no tenderness.  Musculoskeletal: Normal range of motion. She exhibits no edema and no tenderness.  No obvious deformity or injury to the extremities x4  Lymphadenopathy:    She has no cervical adenopathy.  Neurological: She is alert. Coordination normal.  , Oriented, aware of location and circumstances, moves all extremities x4, normal strength of the bilateral upper extremities, able to straight leg raise bilaterally  Skin: Skin is warm and dry. No rash noted. No erythema.  Psychiatric: She has a normal mood and affect. Her behavior is normal.    ED Course  Procedures (including critical care time)  Labs Reviewed - No data to display No results found.   1. Seizure       MDM  The patient has evidence of having a seizure as she does have a slight depressed mental status however she is alert and oriented and while she appears tired is returning to her baseline. We'll need to discuss  further with family members when they arrived to gather more information, vital signs do not show any significant hypertension, fever or tachycardia. Her lung sounds are clear, she is not in any respiratory distress and she states that she has been compliant with her antiseizure medications, Ativan and Keppra given here.  The patient has had no further seizures, she has been able to ambulate to the bathroom without difficulty, she has been given Percocet for her headache and left-sided pain. She states that  she feels at her baseline, states that she always has left-sided pain after seizures including her arm, leg, face, back and abdomen. Patient appears stable for discharge to followup for medication reconciliation with her family Dr. No definite indication for increasing the dose of her Keppra at this time, she already takes 2500 mg of Keppra by mouth daily   Meds given in ED:  Medications  oxyCODONE-acetaminophen (PERCOCET/ROXICET) 5-325 MG per tablet 2 tablet (not administered)  LORazepam (ATIVAN) injection 1 mg (1 mg Intravenous Given 01/24/13 1105)  levETIRAcetam (KEPPRA) tablet 500 mg (500 mg Oral Given 01/24/13 1137)    New Prescriptions   No medications on file      Vida Roller, MD 01/24/13 1341

## 2013-01-25 ENCOUNTER — Ambulatory Visit: Payer: Medicare Other | Admitting: Obstetrics & Gynecology

## 2013-01-25 ENCOUNTER — Encounter: Payer: Self-pay | Admitting: Obstetrics & Gynecology

## 2013-01-25 VITALS — BP 151/98 | HR 94 | Resp 20 | Ht 62.0 in | Wt 350.6 lb

## 2013-01-25 DIAGNOSIS — Z09 Encounter for follow-up examination after completed treatment for conditions other than malignant neoplasm: Secondary | ICD-10-CM

## 2013-01-25 NOTE — Progress Notes (Signed)
  Subjective:    Patient ID: [name] [name], female    DOB: [birth year], 32 y.o.   MRN: [id number]  HPI  [name] [name] is a 32 yo S AA P2 4 weeks post op s/p RLTCS for preeclampsia. She also has a known seizure disorder and HTN. Today she complains of a headache (8 out of 10 on pain scale) as well as scotomata. She was seen at [facility] ER yesterday and had 2 seizures. She was sent home with IV ativan and Keppra. She reports normal bowel and bladder function. She denies depression.  Review of Systems She wants a PPS for birth control. She has been having unprotected sex.    Objective:   Physical Exam  Well-healed incision.      Assessment & Plan:  PP- stable Birth control- rec condoms prn until BTL Seizures, HTN, headache, visual changes. I will send her to the [facility] for labs.

## 2013-01-25 NOTE — Progress Notes (Signed)
Patient ID: [name] [name], female   DOB: November 28, 1980, 32 y.o.   MRN: [id number] Pt states she has H/A's every day. She has tried Tylenol and ibuprofen without relief. She had 2 seizures yesterday and went to [facility] via ambulance.

## 2013-01-30 ENCOUNTER — Encounter (HOSPITAL_COMMUNITY): Payer: Self-pay | Admitting: Pharmacy Technician

## 2013-02-08 ENCOUNTER — Other Ambulatory Visit (HOSPITAL_COMMUNITY): Payer: Medicare Other

## 2013-02-13 ENCOUNTER — Ambulatory Visit (HOSPITAL_COMMUNITY)
Admission: RE | Admit: 2013-02-13 | Payer: Medicare Other | Source: Ambulatory Visit | Admitting: Obstetrics & Gynecology

## 2013-02-13 ENCOUNTER — Encounter (HOSPITAL_COMMUNITY): Admission: RE | Payer: Self-pay | Source: Ambulatory Visit

## 2013-02-13 SURGERY — LAPAROSCOPY OPERATIVE
Anesthesia: Choice | Laterality: Bilateral

## 2013-03-01 ENCOUNTER — Emergency Department (HOSPITAL_COMMUNITY)
Admission: EM | Admit: 2013-03-01 | Discharge: 2013-03-02 | Discharge status: Against Medical Advice | Payer: Medicare Other | Attending: Emergency Medicine | Admitting: Emergency Medicine

## 2013-03-01 ENCOUNTER — Encounter (HOSPITAL_COMMUNITY): Payer: Self-pay

## 2013-03-01 ENCOUNTER — Emergency Department (HOSPITAL_COMMUNITY): Payer: Medicare Other

## 2013-03-01 DIAGNOSIS — G40909 Epilepsy, unspecified, not intractable, without status epilepticus: Secondary | ICD-10-CM | POA: Insufficient documentation

## 2013-03-01 DIAGNOSIS — R000 Tachycardia, unspecified: Secondary | ICD-10-CM | POA: Insufficient documentation

## 2013-03-01 DIAGNOSIS — Z79899 Other long term (current) drug therapy: Secondary | ICD-10-CM | POA: Insufficient documentation

## 2013-03-01 DIAGNOSIS — R0789 Other chest pain: Secondary | ICD-10-CM | POA: Insufficient documentation

## 2013-03-01 DIAGNOSIS — Z862 Personal history of diseases of the blood and blood-forming organs and certain disorders involving the immune mechanism: Secondary | ICD-10-CM | POA: Insufficient documentation

## 2013-03-01 DIAGNOSIS — Z8659 Personal history of other mental and behavioral disorders: Secondary | ICD-10-CM | POA: Insufficient documentation

## 2013-03-01 DIAGNOSIS — R5381 Other malaise: Secondary | ICD-10-CM | POA: Insufficient documentation

## 2013-03-01 DIAGNOSIS — O26899 Other specified pregnancy related conditions, unspecified trimester: Secondary | ICD-10-CM | POA: Insufficient documentation

## 2013-03-01 DIAGNOSIS — R0602 Shortness of breath: Secondary | ICD-10-CM | POA: Insufficient documentation

## 2013-03-01 DIAGNOSIS — R55 Syncope and collapse: Secondary | ICD-10-CM | POA: Insufficient documentation

## 2013-03-01 DIAGNOSIS — Z8679 Personal history of other diseases of the circulatory system: Secondary | ICD-10-CM | POA: Insufficient documentation

## 2013-03-01 DIAGNOSIS — Z8632 Personal history of gestational diabetes: Secondary | ICD-10-CM | POA: Insufficient documentation

## 2013-03-01 DIAGNOSIS — R5383 Other fatigue: Secondary | ICD-10-CM | POA: Insufficient documentation

## 2013-03-01 DIAGNOSIS — Z3202 Encounter for pregnancy test, result negative: Secondary | ICD-10-CM | POA: Insufficient documentation

## 2013-03-01 DIAGNOSIS — O909 Complication of the puerperium, unspecified: Secondary | ICD-10-CM | POA: Insufficient documentation

## 2013-03-01 DIAGNOSIS — Z8619 Personal history of other infectious and parasitic diseases: Secondary | ICD-10-CM | POA: Insufficient documentation

## 2013-03-01 DIAGNOSIS — M79609 Pain in unspecified limb: Secondary | ICD-10-CM | POA: Insufficient documentation

## 2013-03-01 LAB — BASIC METABOLIC PANEL
BUN: 11 mg/dL (ref 6–23)
Calcium: 9.1 mg/dL (ref 8.4–10.5)
GFR calc Af Amer: >90 mL/min (ref >90)
GFR calc non Af Amer: >90 mL/min (ref >90)
Potassium: 4 mEq/L (ref 3.5–5.1)
Sodium: 138 mEq/L (ref 135–145)

## 2013-03-01 NOTE — ED Notes (Signed)
[id number]<ON> Expected date:<BR> Expected time:<BR> Means of arrival:<BR> Comments:<BR> EMS ? Seizure vs LOC

## 2013-03-01 NOTE — ED Notes (Signed)
MD at bedside. 

## 2013-03-01 NOTE — ED Notes (Signed)
Per EMS, called to [employer] that pt was on ground next to back door of car.  Sitting down and leaning into car.  Reports were that pt was found sitting down y car and 911 called.  Pt has hx of seizures and takes keppra.  Pt was lethargic but arousable and able to answer all questions appropriately.  Call was in and arrival within 7 min.  Pt also states feelings of chest tightness 4 days, Left arm and leg pain for 2-3 days.  Also has had sinus congestion and headache.  No cough.  EKG ST 115-120, 136/86, resp 18-20, CBG 112.  Recent birth 2 months ago. HTN, Gestational DM, obesity

## 2013-03-02 ENCOUNTER — Encounter (HOSPITAL_COMMUNITY): Payer: Self-pay

## 2013-03-02 LAB — URINALYSIS, ROUTINE W REFLEX MICROSCOPIC
Bilirubin Urine: NEGATIVE
Leukocytes, UA: NEGATIVE
Nitrite: NEGATIVE
Specific Gravity, Urine: 1.021 (ref 1.005–1.030)
pH: 7.5 (ref 5.0–8.0)

## 2013-03-02 LAB — CBC WITH DIFFERENTIAL/PLATELET
Eosinophils Relative: 2 % (ref 0–5)
Lymphocytes Relative: 36 % (ref 12–46)
Monocytes Relative: 4 % (ref 3–12)
Neutrophils Relative %: 58 % (ref 43–77)
Platelets: 474 10*3/uL — ABNORMAL HIGH (ref 150–400)
RBC: 5.16 MIL/uL — ABNORMAL HIGH (ref 3.87–5.11)
WBC: 8.7 10*3/uL (ref 4.0–10.5)

## 2013-03-02 LAB — POCT PREGNANCY, URINE: Preg Test, Ur: NEGATIVE

## 2013-03-02 MED ORDER — IOHEXOL 350 MG/ML SOLN
100.0000 mL | Freq: Once | INTRAVENOUS | Status: AC | PRN
  Administered 2013-03-02: 100 mL via INTRAVENOUS

## 2013-03-02 NOTE — ED Provider Notes (Signed)
History     CSN: [id number]  Arrival date & time 03/01/13  2227   First MD Initiated Contact with Patient 03/01/13 2301      Chief Complaint  Patient presents with  . Seizures    (Consider location/radiation/quality/duration/timing/severity/associated sxs/prior treatment) HPI 32 yo female presents to the ER with reported altered consciousness.  Pt was going into [employer].  While in the parking lot, she bent over to take her 76 month old out of the car.  She reports she became very dizzy, and then felt herself falling over.  She is unsure what occurred after that time.  Bystanders called 911 due to her lying on ground by car.  No reported seizure activity.  She reports she has not felt well for the past week with generalized weakness and dizziness.  She was seen by her neurologist on Monday and recommended sleep study and EEG.  Pt has h/o sz disorder, migraines, bipolar, and htn in pregnancy.  Pt c/o left sided chest pain, left arm and leg pain for the last 4 days.  Pain is constant, dull, and worse with movement. She also c/o sob.  No prior h/o PE or DVT.  She is 2 months post partum.  No fevers, no cough, no new activities to explain left sided pain.  She is not breast feeding.  Past Medical History  Diagnosis Date  . Migraines     would be rx'd Percacet for relief  . Seizures 05/2011    unsure what started seizures;Neurology appt on 05/30/12;on Kepra 2000mg  bid;was on Topramax 50mg  tid  . Bipolar affective disorder     no meds currently;has been years since taking meds  . Infection     Yeast inf;not frequent  . Anemia     has taken iron supplements in the past  . Yeast infection   . H/O varicella   . Wears glasses   . Hypertension     Pt states she had pre-eclampsia w last pregnancy   . Pregnancy induced hypertension   . Gestational diabetes     Past Surgical History  Procedure Laterality Date  . Cesarean section    . Wisdom tooth extraction      All 4 removed  .  Tonsillectomy      During high school years  . Cesarean section N/A 12/26/2012    Procedure: Repeat cesarean section with delivery of baby;  Surgeon: Allie Bossier, MD;  Location: [facility];  Service: Obstetrics;  Laterality: N/A;    Family History  Problem Relation Age of Onset  . Cancer Mother     Ovarian  . Diabetes Mother   . Hypertension Father   . Cirrhosis Father   . Hypertension Brother   . Asthma Son     History  Substance Use Topics  . Smoking status: Never Smoker   . Smokeless tobacco: Never Used  . Alcohol Use: No    OB History   Grav Para Term Preterm Abortions TAB SAB Ect Mult Living   2 2 2       2       Review of Systems  All other systems reviewed and are negative.  other than listed in HPI  Allergies  Review of patient's allergies indicates no known allergies.  Home Medications   Current Outpatient Rx  Name  Route  Sig  Dispense  Refill  . labetalol (NORMODYNE) 200 MG tablet   Oral   Take 1 tablet (200 mg total) by mouth 3 (  three) times daily.   90 tablet   3   . levETIRAcetam (KEPPRA XR) 500 MG 24 hr tablet   Oral   Take 5 tablets (2,500 mg total) by mouth every morning.   100 tablet   1     BP 156/108  Pulse 103  Temp(Src) 98.2 F (36.8 C) (Oral)  Resp 15  SpO2 98%  Breastfeeding? No  Physical Exam  Nursing note and vitals reviewed. Constitutional: She is oriented to person, place, and time. She appears well-developed and well-nourished.  Morbidly obese  HENT:  Head: Normocephalic and atraumatic.  Right Ear: External ear normal.  Left Ear: External ear normal.  Nose: Nose normal.  Mouth/Throat: Oropharynx is clear and moist.  Eyes: Conjunctivae and EOM are normal. Pupils are equal, round, and reactive to light.  Neck: Normal range of motion. Neck supple. No JVD present. No tracheal deviation present. No thyromegaly present.  Cardiovascular: Regular rhythm, normal heart sounds and intact distal pulses.  Exam reveals no gallop and  no friction rub.   No murmur heard. Tachycardia noted  Pulmonary/Chest: Effort normal and breath sounds normal. No stridor. No respiratory distress. She has no wheezes. She has no rales. She exhibits no tenderness.  Abdominal: Soft. Bowel sounds are normal. She exhibits no distension and no mass. There is no tenderness. There is no rebound and no guarding.  Musculoskeletal: Normal range of motion. She exhibits tenderness (diffuse tenderness to arm, leg, left chest with palpation). She exhibits no edema.  Lymphadenopathy:    She has no cervical adenopathy.  Neurological: She is alert and oriented to person, place, and time. No cranial nerve deficit. She exhibits normal muscle tone. Coordination normal.  Skin: Skin is warm and dry. No rash noted. No erythema. No pallor.  Psychiatric: She has a normal mood and affect. Her behavior is normal. Judgment and thought content normal.    ED Course  Procedures (including critical care time)  Labs Reviewed  URINALYSIS, ROUTINE W REFLEX MICROSCOPIC - Abnormal; Notable for the following:    APPearance CLOUDY (*)    All other components within normal limits  CBC WITH DIFFERENTIAL - Abnormal; Notable for the following:    RBC 5.16 (*)    Hemoglobin 10.3 (*)    HCT 34.4 (*)    MCV 66.7 (*)    MCH 20.0 (*)    MCHC 29.9 (*)    RDW 17.8 (*)    Platelets 474 (*)    All other components within normal limits  BASIC METABOLIC PANEL  CBC WITH DIFFERENTIAL  CG4 I-STAT (LACTIC ACID)   Ct Angio Chest W/cm &/or Wo Cm  03/02/2013   *RADIOLOGY REPORT*  Clinical Data: Shortness of breath, chest pain, tachycardia.  CT ANGIOGRAPHY CHEST  Technique:  Multidetector CT imaging of the chest using the standard protocol during bolus administration of intravenous contrast. Multiplanar reconstructed images including MIPs were obtained and reviewed to evaluate the vascular anatomy.  Contrast: OMNIPAQUE IOHEXOL 350 MG/ML SOLN  Comparison: 09/25/2012 radiograph   Findings: Suboptimal contrast bolus timing with the majority of the contrast remaining within the left upper extremity and SVC. No large main pulmonary arterial filling defect is identified.  The lobar and more peripheral branches are nondiagnostic due to mixing of opacified and unopacified blood.  Upper normal to mildly enlarged heart size.  Normal caliber aorta.  No pleural or pericardial effusion.  No intrathoracic lymphadenopathy.  Limited images through the upper abdomen show no acute finding.  Central airways are  patent.  Bilateral areas of air trapping/mosaic attenuation. No confluent airspace opacity.  No pneumothorax.  No acute osseous finding.  IMPRESSION: Suboptimal contrast bolus timing, with suboptimal evaluation of the lobar and more peripheral pulmonary arterial branches.  Recommend VQ scan if clinical concern persists.  Heart size upper normal to mildly enlarged.  Mosaic attenuation, most in keeping with areas of air trapping/small airways disease.   Original Report Authenticated By: Jearld Lesch, M.D.     Date: 03/02/2013  Rate: 103  Rhythm: sinus tachycardia  QRS Axis: normal  Intervals: normal  ST/T Wave abnormalities: normal  Conduction Disutrbances:none  Narrative Interpretation:   Old EKG Reviewed: changes noted    1. Syncope       MDM  32 yo female s/p LOC, seizure vs syncope, with 4 days of left sided body pain, sob.  Concern for PE as cause for syncope given postpartum period, obesity, tachycardia.  Will get labs, ekg, cta chest.          Olivia Mackie, MD 03/02/13 [phone]

## 2013-03-02 NOTE — ED Notes (Addendum)
Pt IV dc per pt request.  Pt ambulated to bathroom.  Unable to find pt 10 min later.  Notified MD.   Pt has left the ER

## 2013-04-11 ENCOUNTER — Encounter (HOSPITAL_COMMUNITY): Payer: Self-pay | Admitting: *Deleted

## 2013-04-11 ENCOUNTER — Emergency Department (HOSPITAL_COMMUNITY)
Admission: EM | Admit: 2013-04-11 | Discharge: 2013-04-11 | Disposition: A | Discharge status: Home / Self Care | Payer: Medicare Other | Attending: Emergency Medicine | Admitting: Emergency Medicine

## 2013-04-11 DIAGNOSIS — R569 Unspecified convulsions: Secondary | ICD-10-CM

## 2013-04-11 DIAGNOSIS — Z79899 Other long term (current) drug therapy: Secondary | ICD-10-CM | POA: Insufficient documentation

## 2013-04-11 DIAGNOSIS — Z862 Personal history of diseases of the blood and blood-forming organs and certain disorders involving the immune mechanism: Secondary | ICD-10-CM | POA: Insufficient documentation

## 2013-04-11 DIAGNOSIS — Z8619 Personal history of other infectious and parasitic diseases: Secondary | ICD-10-CM | POA: Insufficient documentation

## 2013-04-11 DIAGNOSIS — G40909 Epilepsy, unspecified, not intractable, without status epilepticus: Secondary | ICD-10-CM | POA: Insufficient documentation

## 2013-04-11 DIAGNOSIS — I10 Essential (primary) hypertension: Secondary | ICD-10-CM | POA: Insufficient documentation

## 2013-04-11 DIAGNOSIS — Z8659 Personal history of other mental and behavioral disorders: Secondary | ICD-10-CM | POA: Insufficient documentation

## 2013-04-11 DIAGNOSIS — Z8742 Personal history of other diseases of the female genital tract: Secondary | ICD-10-CM | POA: Insufficient documentation

## 2013-04-11 DIAGNOSIS — Z8679 Personal history of other diseases of the circulatory system: Secondary | ICD-10-CM | POA: Insufficient documentation

## 2013-04-11 MED ORDER — NAPROXEN 500 MG PO TABS: 500.0000 mg | ORAL_TABLET | Freq: Two times a day (BID) | ORAL | Status: DC

## 2013-04-11 MED ORDER — OXYCODONE-ACETAMINOPHEN 5-325 MG PO TABS
2.0000 | ORAL_TABLET | Freq: Once | ORAL | Status: AC
  Administered 2013-04-11: 2 via ORAL
  Filled 2013-04-11: qty 2

## 2013-04-11 MED ORDER — LORAZEPAM 1 MG PO TABS
1.0000 mg | ORAL_TABLET | Freq: Once | ORAL | Status: AC
  Administered 2013-04-11: 1 mg via ORAL
  Filled 2013-04-11: qty 1

## 2013-04-11 NOTE — ED Provider Notes (Signed)
History    CSN: [id number] Arrival date & time 04/11/13  1312  First MD Initiated Contact with Patient 04/11/13 1346     Chief Complaint  Patient presents with  . Seizures   (Consider location/radiation/quality/duration/timing/severity/associated sxs/prior Treatment) HPI Comments: 32 year old female with a history of a seizure disorder as well as bipolar disorder and migraines. She is currently taking Keppra as well as another seizure medication of which she does not remember the name and we started in the last month. Today she had a seizure lasting approximately 5 minutes, there was no prodromal symptoms but she does note having a headache for the last 2 days. The seizure lasted 5 minutes, was tonic-clonic and witnessed by a significant other and resolve spontaneously. The paramedics found the patient to be postictal but had a gradual return to baseline in route to the hospital. The patient admits to having a chronic headache as well as chronic left-sided pain which happens after seizures. She denies fevers chills nausea vomiting coughing shortness of breath back pain swelling or rashes. She denies any missed doses of medication as well and is not an alcohol drinker.  Patient is a 32 y.o. female presenting with seizures. The history is provided by the patient, the EMS personnel and the spouse.  Seizures  Past Medical History  Diagnosis Date  . Migraines     would be rx'd Percacet for relief  . Seizures 05/2011    unsure what started seizures;Neurology appt on 05/30/12;on Kepra 2000mg  bid;was on Topramax 50mg  tid  . Bipolar affective disorder     no meds currently;has been years since taking meds  . Infection     Yeast inf;not frequent  . Anemia     has taken iron supplements in the past  . Yeast infection   . H/O varicella   . Wears glasses   . Hypertension     Pt states she had pre-eclampsia w last pregnancy   . Pregnancy induced hypertension   . Gestational diabetes    Past  Surgical History  Procedure Laterality Date  . Cesarean section    . Wisdom tooth extraction      All 4 removed  . Tonsillectomy      During high school years  . Cesarean section N/A 12/26/2012    Procedure: Repeat cesarean section with delivery of baby;  Surgeon: Allie Bossier, MD;  Location: [facility];  Service: Obstetrics;  Laterality: N/A;   Family History  Problem Relation Age of Onset  . Cancer Mother     Ovarian  . Diabetes Mother   . Hypertension Father   . Cirrhosis Father   . Hypertension Brother   . Asthma Son    History  Substance Use Topics  . Smoking status: Never Smoker   . Smokeless tobacco: Never Used  . Alcohol Use: No   OB History   Grav Para Term Preterm Abortions TAB SAB Ect Mult Living   2 2 2       2      Review of Systems  Neurological: Positive for seizures.  All other systems reviewed and are negative.    Allergies  Review of patient's allergies indicates no known allergies.  Home Medications   Current Outpatient Rx  Name  Route  Sig  Dispense  Refill  . labetalol (NORMODYNE) 200 MG tablet   Oral   Take 1 tablet (200 mg total) by mouth 3 (three) times daily.   90 tablet   3   .  LamoTRIgine (LAMICTAL XR) 100 MG TB24   Oral   Take 1 tablet by mouth daily.         Marland Kitchen levETIRAcetam (KEPPRA XR) 500 MG 24 hr tablet   Oral   Take 5 tablets (2,500 mg total) by mouth every morning.   100 tablet   1   . naproxen (NAPROSYN) 500 MG tablet   Oral   Take 1 tablet (500 mg total) by mouth 2 (two) times daily with a meal.   30 tablet   0    BP 137/89  Pulse 82  Temp(Src) 98.7 F (37.1 C) (Oral)  Resp 18  SpO2 98% Physical Exam  Nursing note and vitals reviewed. Constitutional: She appears well-developed and well-nourished. No distress.  HENT:  Head: Normocephalic and atraumatic.  Mouth/Throat: Oropharynx is clear and moist. No oropharyngeal exudate.  Eyes: Conjunctivae and EOM are normal. Pupils are equal, round, and reactive to light.  Right eye exhibits no discharge. Left eye exhibits no discharge. No scleral icterus.  Neck: Normal range of motion. Neck supple. No JVD present. No thyromegaly present.  Cardiovascular: Normal rate, regular rhythm, normal heart sounds and intact distal pulses.  Exam reveals no gallop and no friction rub.   No murmur heard. Pulmonary/Chest: Effort normal and breath sounds normal. No respiratory distress. She has no wheezes. She has no rales.  Abdominal: Soft. Bowel sounds are normal. She exhibits no distension and no mass. There is no tenderness.  Musculoskeletal: Normal range of motion. She exhibits no edema and no tenderness.  Lymphadenopathy:    She has no cervical adenopathy.  Neurological: She is alert. Coordination normal.  Speech is clear, memory is intact other than the events of the seizure, moves all extremities x4 with normal coordination, sensation and strength. Cranial nerves III through XII are intact  Skin: Skin is warm and dry. No rash noted. No erythema.  Psychiatric: She has a normal mood and affect. Her behavior is normal.    ED Course  Procedures (including critical care time) Labs Reviewed - No data to display No results found. 1. Seizure     MDM  The patient is not appear to have any focal neurologic deficits, her mental status has returned to baseline, she does not remember the name of the medication she is taking, will attempt to contact the pharmacy to get this information, she is stable and does not require acute intervention at this time.  She states that she has fairly frequent seizures including one or 2 a month.  Has had no further seizures, ativan PO and percocet given for pain in the L arm and leg and the headache.  She is feeling better, is feeling safe for d/c.  I have recommended close f/u as outpt for ongoing med management.  ED ECG REPORT  I personally interpreted this EKG   Date: 04/11/2013   Rate: 83  Rhythm: normal sinus rhythm  QRS Axis: normal   Intervals: normal  ST/T Wave abnormalities: normal  Conduction Disutrbances:none  Narrative Interpretation:   Old EKG Reviewed: c/w 03/01/13, no sig changes  Meds given in ED:  Medications  LORazepam (ATIVAN) tablet 1 mg (1 mg Oral Given 04/11/13 1503)  oxyCODONE-acetaminophen (PERCOCET/ROXICET) 5-325 MG per tablet 2 tablet (2 tablets Oral Given 04/11/13 1503)    New Prescriptions   NAPROXEN (NAPROSYN) 500 MG TABLET    Take 1 tablet (500 mg total) by mouth 2 (two) times daily with a meal.      Oris Drone  Hyacinth Meeker, MD 04/11/13 [phone]

## 2013-04-11 NOTE — ED Notes (Signed)
EMS reports they were called out for seizure. Patient's boyfriend reports patient has had seizure lasting approx. 5 minutes. Patient has history of same. Postictal upon EMS arrival, patient more alert during transport. Vitals stable.

## 2013-05-29 ENCOUNTER — Emergency Department (HOSPITAL_COMMUNITY)
Admission: EM | Admit: 2013-05-29 | Discharge: 2013-05-29 | Disposition: A | Discharge status: Home / Self Care | Payer: Medicare Other | Attending: Emergency Medicine | Admitting: Emergency Medicine

## 2013-05-29 ENCOUNTER — Encounter (HOSPITAL_COMMUNITY): Payer: Self-pay | Admitting: Emergency Medicine

## 2013-05-29 DIAGNOSIS — F29 Unspecified psychosis not due to a substance or known physiological condition: Secondary | ICD-10-CM | POA: Insufficient documentation

## 2013-05-29 DIAGNOSIS — Z79899 Other long term (current) drug therapy: Secondary | ICD-10-CM | POA: Insufficient documentation

## 2013-05-29 DIAGNOSIS — R569 Unspecified convulsions: Secondary | ICD-10-CM

## 2013-05-29 DIAGNOSIS — Z8679 Personal history of other diseases of the circulatory system: Secondary | ICD-10-CM | POA: Insufficient documentation

## 2013-05-29 DIAGNOSIS — Z8659 Personal history of other mental and behavioral disorders: Secondary | ICD-10-CM | POA: Insufficient documentation

## 2013-05-29 DIAGNOSIS — Z8619 Personal history of other infectious and parasitic diseases: Secondary | ICD-10-CM | POA: Insufficient documentation

## 2013-05-29 DIAGNOSIS — Z789 Other specified health status: Secondary | ICD-10-CM | POA: Insufficient documentation

## 2013-05-29 DIAGNOSIS — Z8632 Personal history of gestational diabetes: Secondary | ICD-10-CM | POA: Insufficient documentation

## 2013-05-29 DIAGNOSIS — Z862 Personal history of diseases of the blood and blood-forming organs and certain disorders involving the immune mechanism: Secondary | ICD-10-CM | POA: Insufficient documentation

## 2013-05-29 DIAGNOSIS — G40909 Epilepsy, unspecified, not intractable, without status epilepticus: Secondary | ICD-10-CM | POA: Insufficient documentation

## 2013-05-29 NOTE — ED Notes (Signed)
[id number]<NF> Expected date:<BR> Expected time:<BR> Means of arrival:<BR> Comments:<BR> EMS/female/seizure

## 2013-05-29 NOTE — ED Provider Notes (Signed)
CSN: [id number]     Arrival date & time 05/29/13  2008 History     First MD Initiated Contact with Patient 05/29/13 2021     Chief Complaint  Patient presents with  . Seizures   (Consider location/radiation/quality/duration/timing/severity/associated sxs/prior Treatment) HPI Comments: Patient with a history of Seizure Disorder presents today with a chief complaint of seizure.  She reports that she had two separate seizures earlier today and also two other seizures earlier in the week.  She reports that she was lying down during both of her seizures today and therefore did not hit her head.  She reports that the seizure this morning was witnessed by her childs father, but the seizure this afternoon was unwitnessed.  She states that her child's father described the seizure as a tonic clonic seizure and reported that it lasted a few minutes.  She denies any bowel or bladder incontinence associated with either seizure today.  She states that she had some confusion immediately following the seizure, but now reports that she is currently feeling back to baseline. She is followed by [facility] and is currently on Lamictal.  She reports that the medication was changed from Kepra to Lamictal approximately one month ago.  She states that she has been compliant with her medications and has not missed any doses.  She denies fever, chills, headache, nausea, vomiting, or weakness at this time.  The history is provided by the patient.    Past Medical History  Diagnosis Date  . Migraines     would be rx'd Percacet for relief  . Seizures 05/2011    unsure what started seizures;Neurology appt on 05/30/12;on Kepra 2000mg  bid;was on Topramax 50mg  tid  . Bipolar affective disorder     no meds currently;has been years since taking meds  . Infection     Yeast inf;not frequent  . Anemia     has taken iron supplements in the past  . Yeast infection   . H/O varicella   . Wears glasses   . Hypertension      Pt states she had pre-eclampsia w last pregnancy   . Pregnancy induced hypertension   . Gestational diabetes    Past Surgical History  Procedure Laterality Date  . Cesarean section    . Wisdom tooth extraction      All 4 removed  . Tonsillectomy      During high school years  . Cesarean section N/A 12/26/2012    Procedure: Repeat cesarean section with delivery of baby;  Surgeon: Allie Bossier, MD;  Location: [facility];  Service: Obstetrics;  Laterality: N/A;   Family History  Problem Relation Age of Onset  . Cancer Mother     Ovarian  . Diabetes Mother   . Hypertension Father   . Cirrhosis Father   . Hypertension Brother   . Asthma Son    History  Substance Use Topics  . Smoking status: Never Smoker   . Smokeless tobacco: Never Used  . Alcohol Use: No   OB History   Grav Para Term Preterm Abortions TAB SAB Ect Mult Living   2 2 2       2      Review of Systems  Neurological: Positive for seizures.  All other systems reviewed and are negative.    Allergies  Review of patient's allergies indicates no known allergies.  Home Medications   Current Outpatient Rx  Name  Route  Sig  Dispense  Refill  . LamoTRIgine (LAMICTAL  XR) 100 MG TB24   Oral   Take 1 tablet by mouth daily.          BP 155/72  Pulse 99  Temp(Src) 99.1 F (37.3 C) (Oral)  Resp 18  SpO2 98% Physical Exam  Nursing note and vitals reviewed. Constitutional: She appears well-developed and well-nourished.  HENT:  Head: Normocephalic and atraumatic.  Mouth/Throat: Oropharynx is clear and moist.  Eyes: EOM are normal. Pupils are equal, round, and reactive to light.  Neck: Normal range of motion. Neck supple.  Cardiovascular: Normal rate, regular rhythm and normal heart sounds.   Pulmonary/Chest: Effort normal and breath sounds normal.  Neurological: She is alert. She has normal strength. No cranial nerve deficit or sensory deficit. Coordination normal.  Skin: Skin is warm and dry.   Psychiatric: She has a normal mood and affect.    ED Course   Procedures (including critical care time)  Labs Reviewed - No data to display No results found. No diagnosis found.  Patient discussed with Dr. Denton Lank.  MDM  Patient with history of Seizure Disorder presents today after a seizure.  She reports that she has been compliant with her seizure medication.  Patient is back to baseline.  Normal neurological exam.  Therefore, feel that the patient is stable for discharge.  Patient instructed to follow up with her Neurologist regarding possible changes in her medication or changes in the dose of her Lamictal.  Magnus Sinning, PA-C 05/29/13 2348

## 2013-05-29 NOTE — ED Notes (Signed)
Brought in by EMS from home with c/o seizures.  Per EMS, pt reported that she felt she had a seizure, also reported that she also had a seizure this morning which was witnessed by boyfriend; pt states that she takes Keppra for seizure but does not seem to be working-- reports to EMS that "they have been trying to find out what medicine that works for her for 2 years".  Pt presents to ED A/ox4, in no s/s apparent distress.

## 2013-05-30 NOTE — ED Provider Notes (Signed)
Medical screening examination/treatment/procedure(s) were performed by non-physician practitioner and as supervising physician I was immediately available for consultation/collaboration.   Netta Fodge E Rossie Scarfone, MD 05/30/13 2136 

## 2013-07-13 IMAGING — CR DG SHOULDER 2+V*L*
4 series · 4 of 4 positions shown · non-contrast
Comparison: None.

CLINICAL DATA: Seizure.  Fell and injured left shoulder.

LEFT SHOULDER - 2+ VIEW 09/10/2011:

[w shoulder ap internal left]
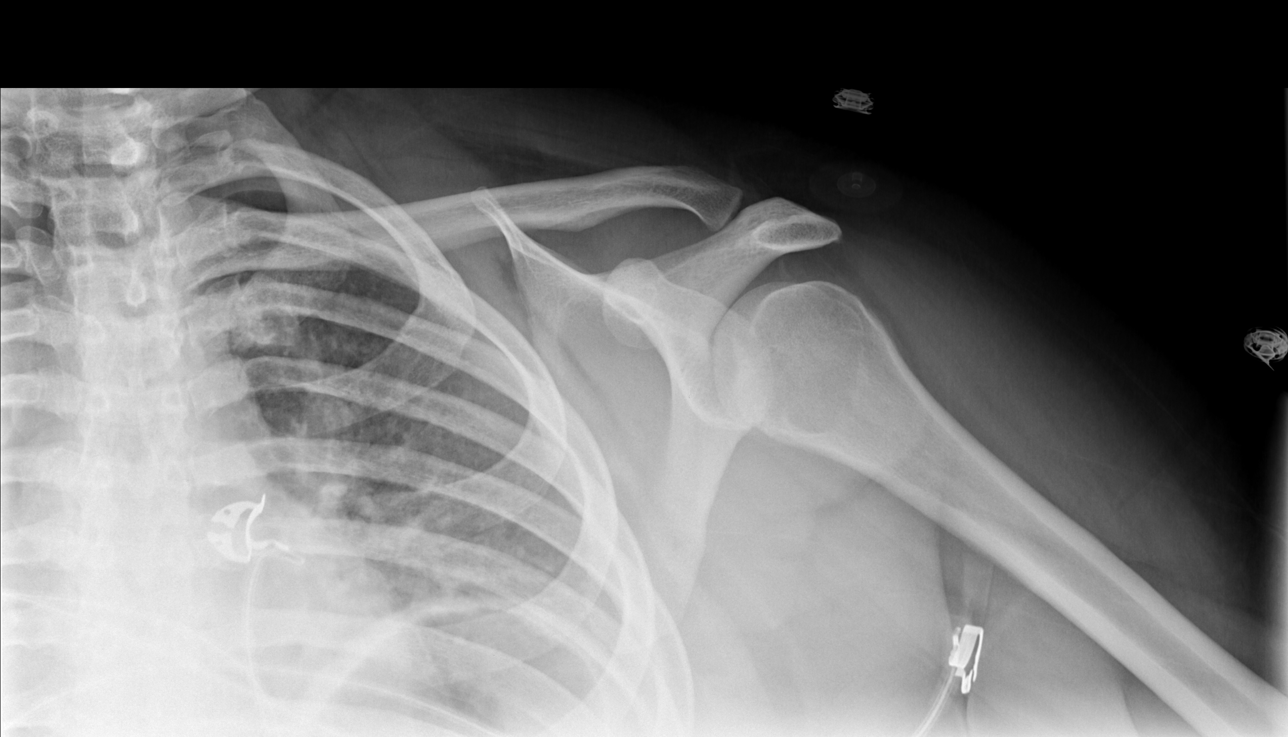

[w shoulder ap external left]
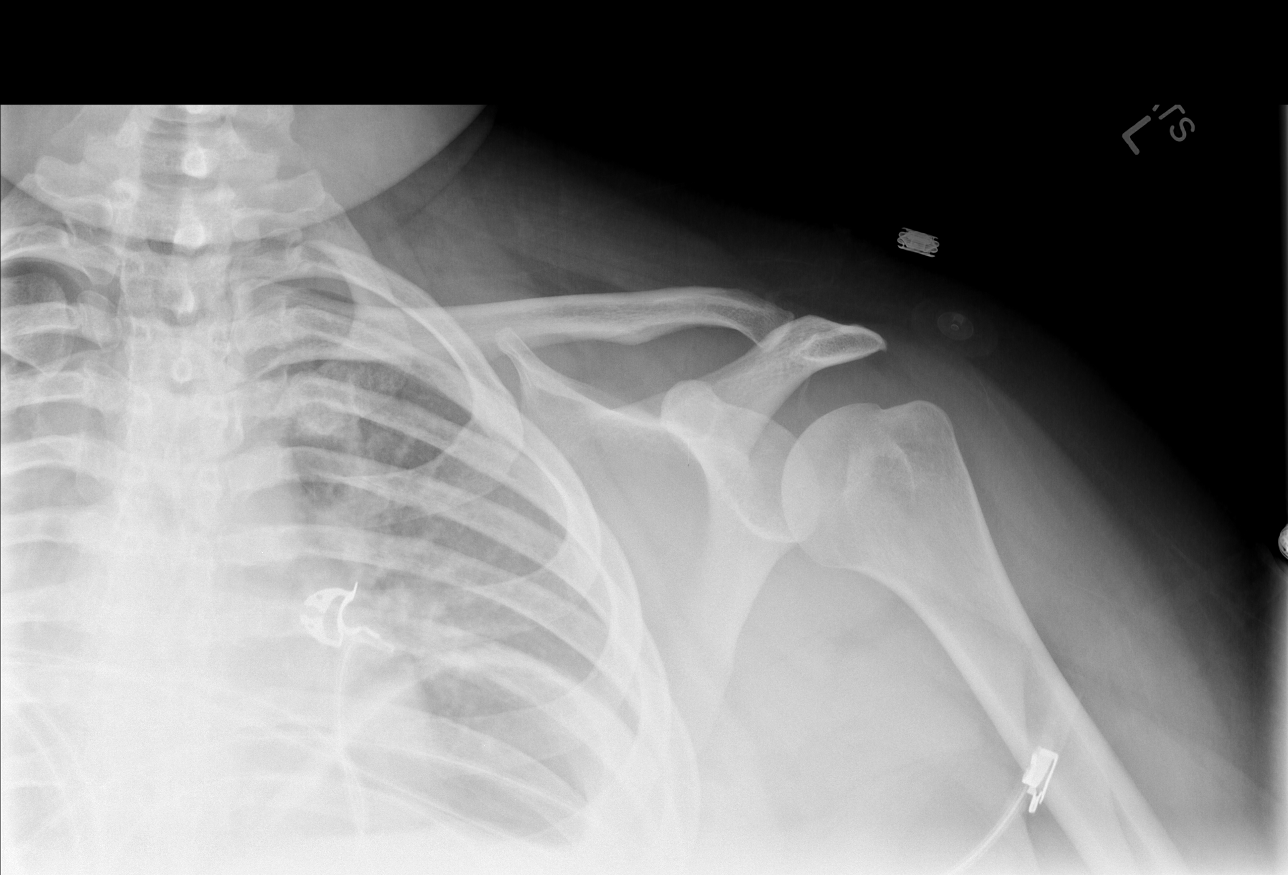

[w shoulder y view left (1 of 2)]
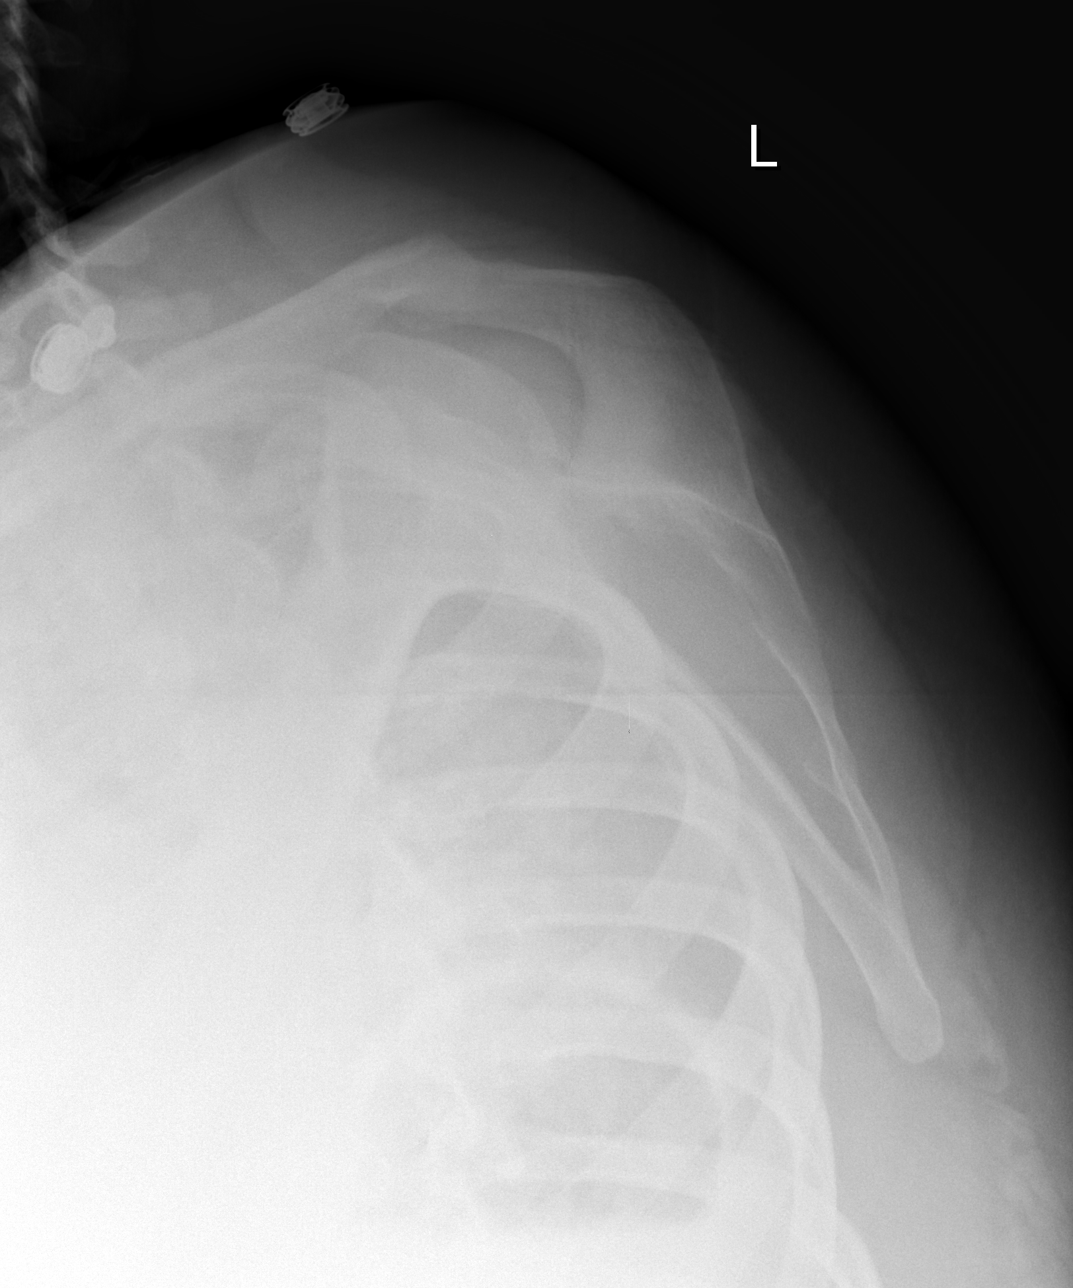

[w shoulder y view left (2 of 2)]
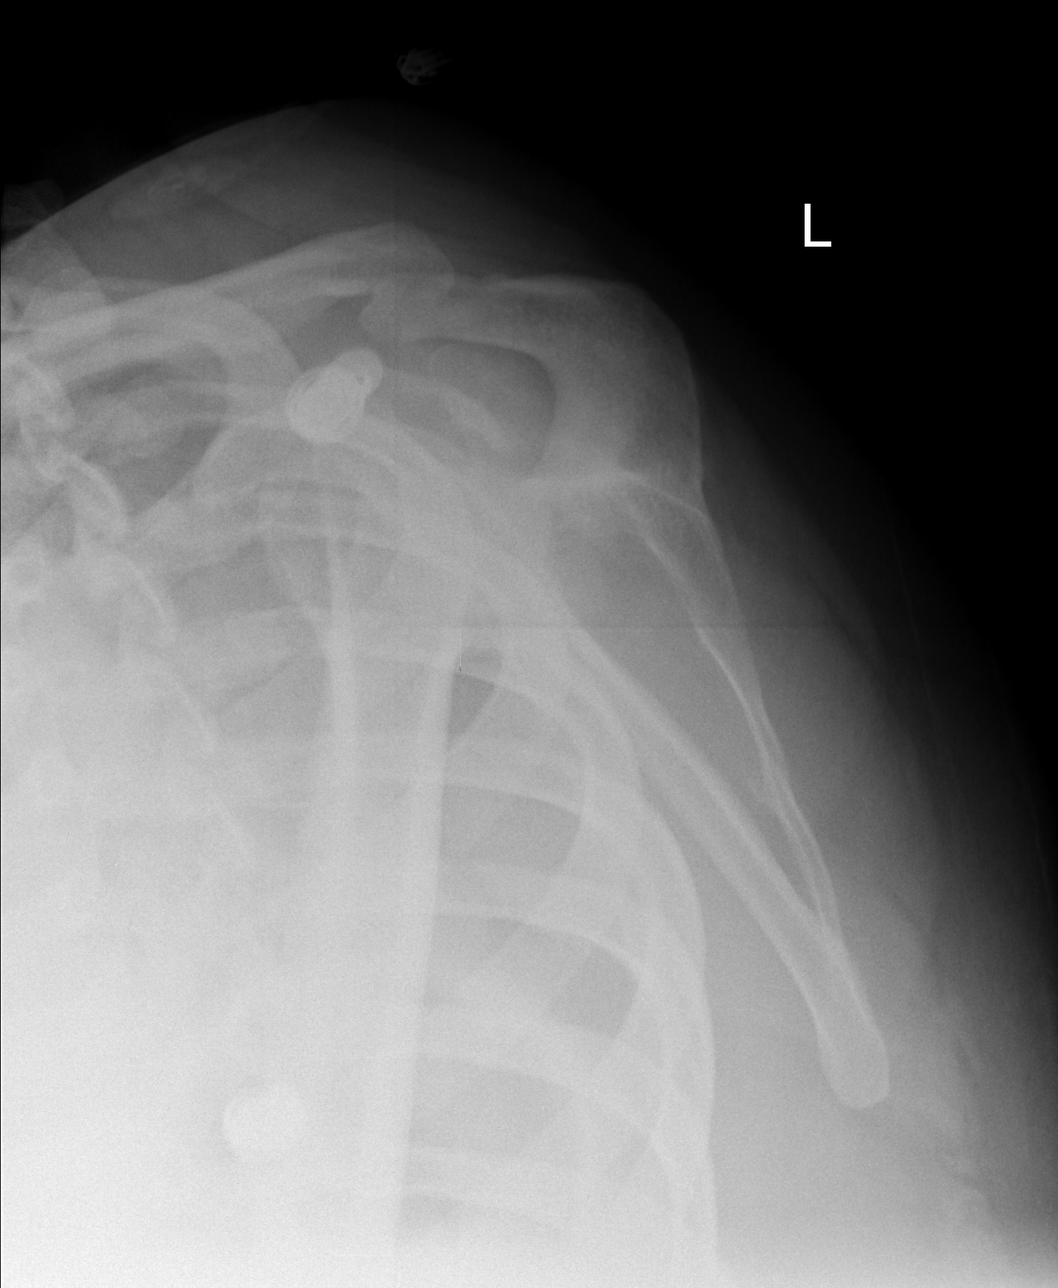

[4 of 4 positions shown; findings below may reference images not displayed]

FINDINGS: Mild inferior glenohumeral subluxation, which may be
related to a joint effusion.  No evidence of frank glenohumeral
dislocation.  No acute fractures.  Acromioclavicular joint intact.
IMPRESSION: Mild inferior glenohumeral subluxation, possibly related to
shoulder joint effusion.  No evidence of frank glenohumeral
dislocation or acute fractures.

## 2014-03-14 IMAGING — CR DG SHOULDER 2+V*L*
4 series · 4 of 4 positions shown · non-contrast
Comparison: 01/10/2012

CLINICAL DATA: Seizure.  Fall.  Shoulder pain.

LEFT SHOULDER - 2+ VIEW

[x shoulder ap left (1 of 4)]
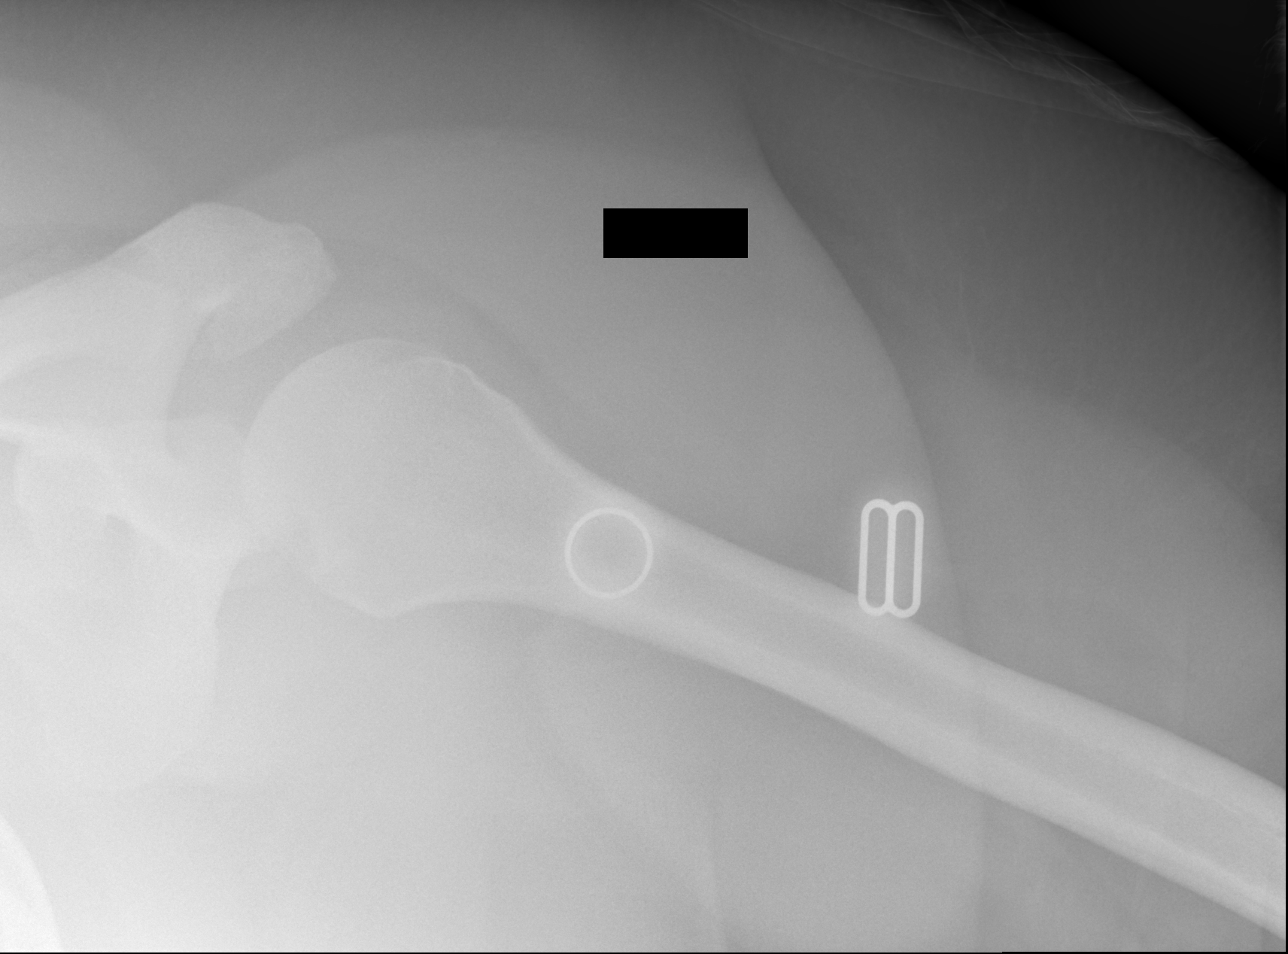

[x shoulder ap left (2 of 4)]
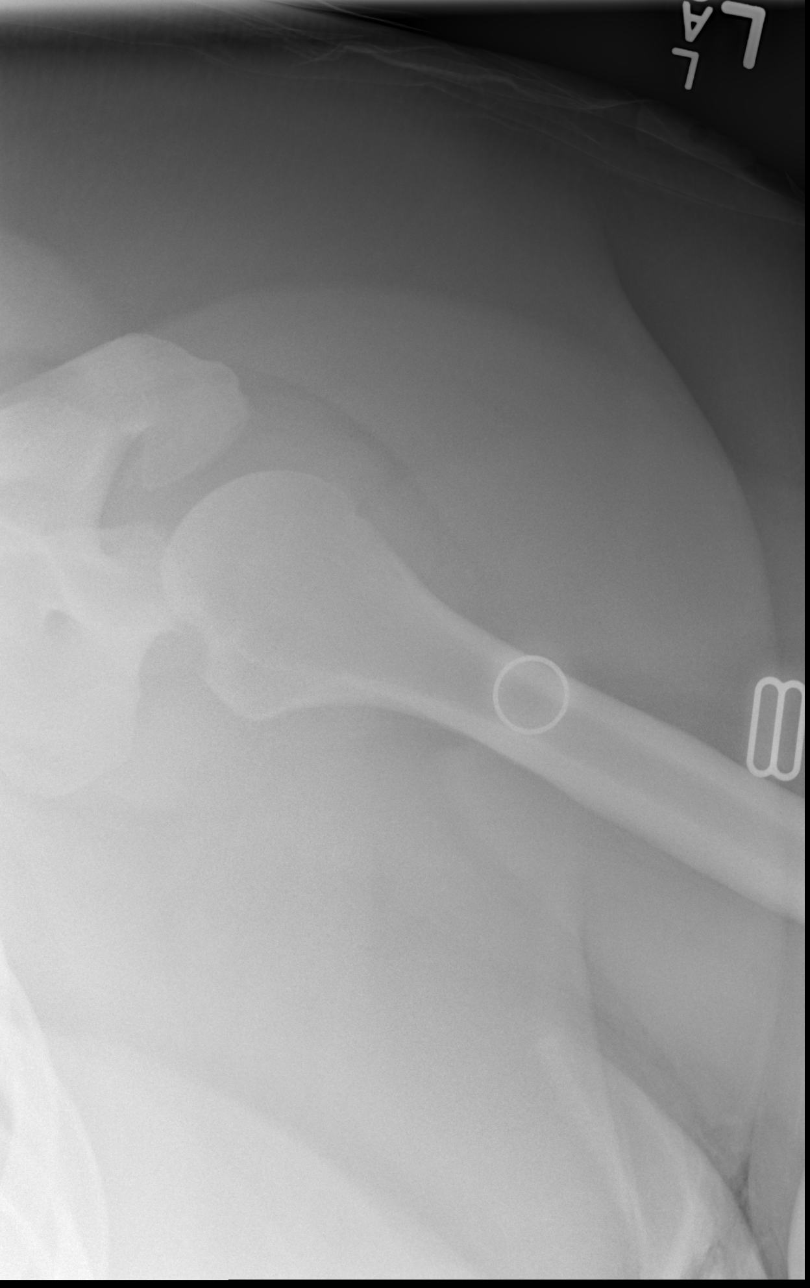

[x shoulder ap left (3 of 4)]
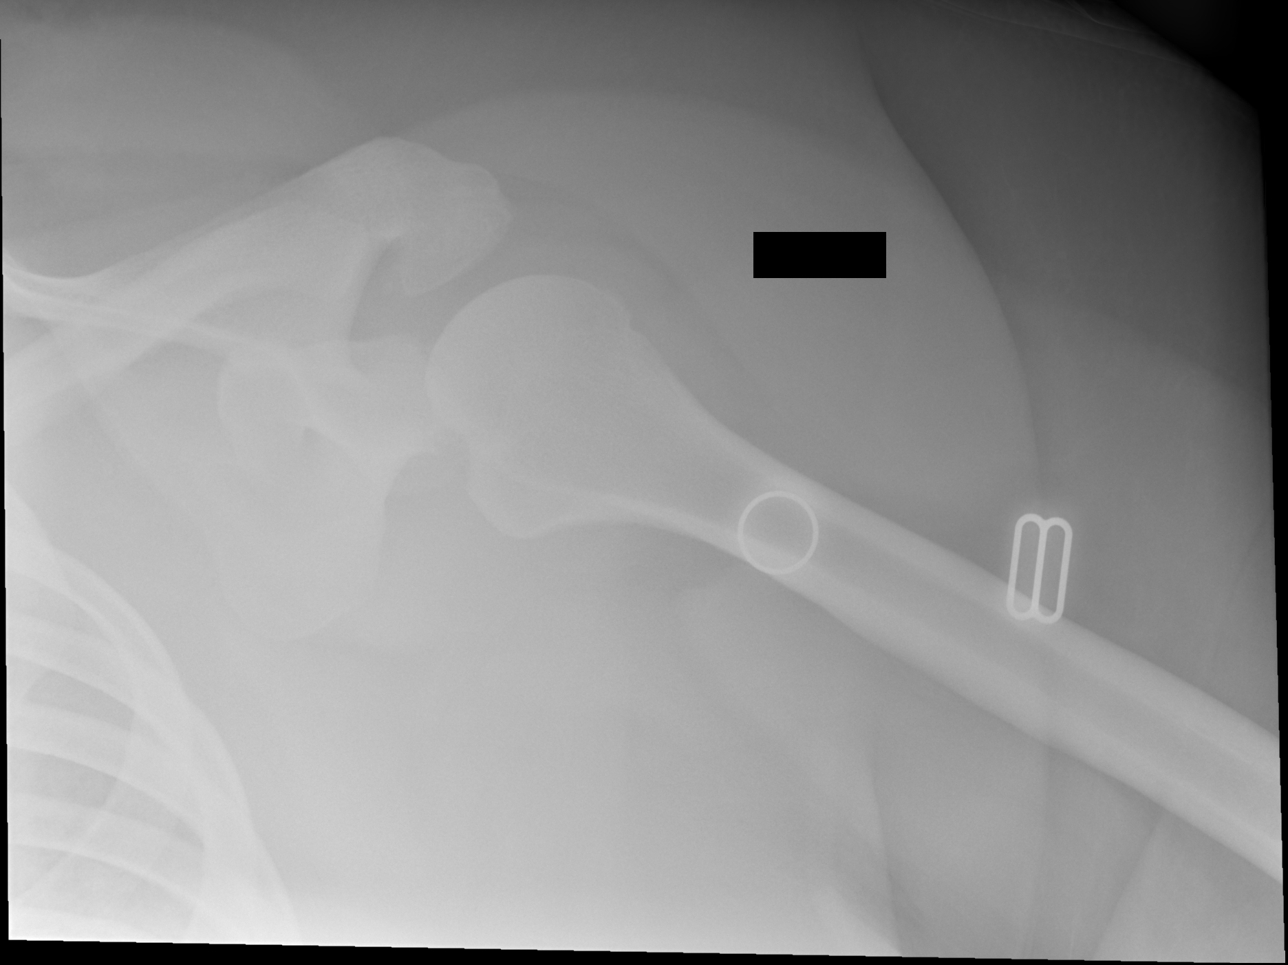

[x shoulder ap left (4 of 4)]
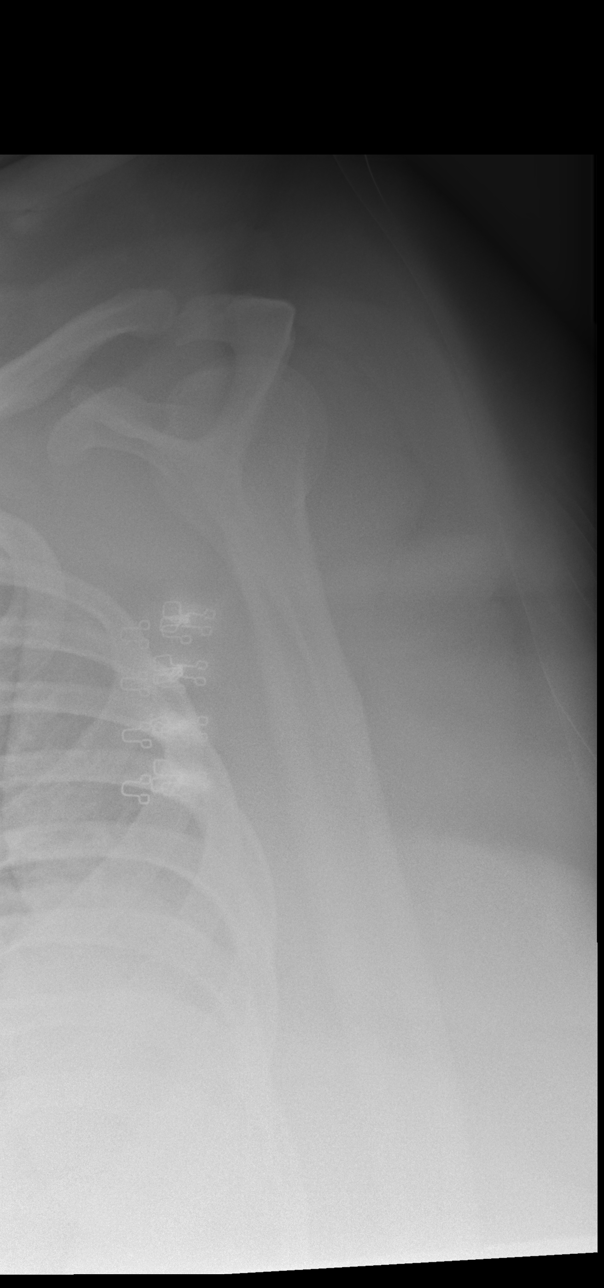

[4 of 4 positions shown; findings below may reference images not displayed]

FINDINGS: Patient body habitus and difficulty with positioning
apparently causes the various nonstandard views which are provided.
These seem to be attempts at transscapular or possibly axillary
images, but end up oblique.  My best estimate is that the humeral
head is reasonably adjacent to the glenoid, favoring lack of
dislocation, but given the highly nonstandard projections the
possibility of a posterior dislocation is not easily excluded.  The
patient is reportedly pregnant which further complicates imaging.
IMPRESSION: 1.  Nonstandard views reduced diagnostic sensitivity and negative
predictive value.  I do not see a definite dislocation, but if the
clinical findings are compelling, CT scan may be required because
we are unable to obtain standard radiographic images.

## 2014-08-20 ENCOUNTER — Encounter (HOSPITAL_COMMUNITY): Payer: Self-pay | Admitting: Emergency Medicine

## 2016-01-22 DIAGNOSIS — M25562 Pain in left knee: Secondary | ICD-10-CM | POA: Insufficient documentation

## 2016-01-22 DIAGNOSIS — F319 Bipolar disorder, unspecified: Secondary | ICD-10-CM | POA: Insufficient documentation

## 2016-01-22 DIAGNOSIS — R569 Unspecified convulsions: Secondary | ICD-10-CM | POA: Insufficient documentation

## 2016-02-13 DIAGNOSIS — H52203 Unspecified astigmatism, bilateral: Secondary | ICD-10-CM | POA: Insufficient documentation

## 2016-02-13 DIAGNOSIS — H16229 Keratoconjunctivitis sicca, not specified as Sjogren's, unspecified eye: Secondary | ICD-10-CM | POA: Insufficient documentation

## 2016-02-13 DIAGNOSIS — E119 Type 2 diabetes mellitus without complications: Secondary | ICD-10-CM | POA: Insufficient documentation

## 2017-12-13 DIAGNOSIS — I82431 Acute embolism and thrombosis of right popliteal vein: Secondary | ICD-10-CM | POA: Insufficient documentation

## 2017-12-13 DIAGNOSIS — J849 Interstitial pulmonary disease, unspecified: Secondary | ICD-10-CM | POA: Insufficient documentation

## 2017-12-13 DIAGNOSIS — I2699 Other pulmonary embolism without acute cor pulmonale: Secondary | ICD-10-CM | POA: Insufficient documentation

## 2017-12-13 DIAGNOSIS — Z8669 Personal history of other diseases of the nervous system and sense organs: Secondary | ICD-10-CM | POA: Insufficient documentation

## 2017-12-14 DIAGNOSIS — N939 Abnormal uterine and vaginal bleeding, unspecified: Secondary | ICD-10-CM | POA: Insufficient documentation

## 2017-12-14 DIAGNOSIS — D509 Iron deficiency anemia, unspecified: Secondary | ICD-10-CM | POA: Insufficient documentation

## 2017-12-14 DIAGNOSIS — Z9889 Other specified postprocedural states: Secondary | ICD-10-CM | POA: Insufficient documentation

## 2017-12-14 DIAGNOSIS — F4321 Adjustment disorder with depressed mood: Secondary | ICD-10-CM | POA: Insufficient documentation

## 2017-12-15 DIAGNOSIS — Z9189 Other specified personal risk factors, not elsewhere classified: Secondary | ICD-10-CM | POA: Insufficient documentation

## 2017-12-20 DIAGNOSIS — K8020 Calculus of gallbladder without cholecystitis without obstruction: Secondary | ICD-10-CM | POA: Insufficient documentation

## 2017-12-20 DIAGNOSIS — F419 Anxiety disorder, unspecified: Secondary | ICD-10-CM | POA: Insufficient documentation

## 2017-12-20 DIAGNOSIS — G5601 Carpal tunnel syndrome, right upper limb: Secondary | ICD-10-CM | POA: Insufficient documentation

## 2018-01-21 DIAGNOSIS — I82401 Acute embolism and thrombosis of unspecified deep veins of right lower extremity: Secondary | ICD-10-CM | POA: Insufficient documentation

## 2018-01-21 DIAGNOSIS — Z86711 Personal history of pulmonary embolism: Secondary | ICD-10-CM | POA: Insufficient documentation

## 2018-01-25 DIAGNOSIS — G43909 Migraine, unspecified, not intractable, without status migrainosus: Secondary | ICD-10-CM | POA: Insufficient documentation

## 2019-04-25 DIAGNOSIS — S90122A Contusion of left lesser toe(s) without damage to nail, initial encounter: Secondary | ICD-10-CM | POA: Insufficient documentation

## 2020-04-04 DIAGNOSIS — H9201 Otalgia, right ear: Secondary | ICD-10-CM | POA: Insufficient documentation

## 2020-04-15 DIAGNOSIS — K148 Other diseases of tongue: Secondary | ICD-10-CM | POA: Insufficient documentation

## 2020-10-01 DIAGNOSIS — I10 Essential (primary) hypertension: Secondary | ICD-10-CM | POA: Insufficient documentation

## 2021-03-10 DIAGNOSIS — M51369 Other intervertebral disc degeneration, lumbar region without mention of lumbar back pain or lower extremity pain: Secondary | ICD-10-CM | POA: Insufficient documentation

## 2021-03-10 DIAGNOSIS — M170 Bilateral primary osteoarthritis of knee: Secondary | ICD-10-CM | POA: Insufficient documentation

## 2021-08-02 ENCOUNTER — Other Ambulatory Visit: Payer: Self-pay

## 2021-08-02 ENCOUNTER — Encounter (HOSPITAL_COMMUNITY): Payer: Self-pay

## 2021-08-02 ENCOUNTER — Emergency Department (HOSPITAL_COMMUNITY)
Admission: EM | Admit: 2021-08-02 | Discharge: 2021-08-02 | Disposition: A | Discharge status: Home / Self Care | Payer: Medicare Other | Attending: Emergency Medicine | Admitting: Emergency Medicine

## 2021-08-02 DIAGNOSIS — R197 Diarrhea, unspecified: Secondary | ICD-10-CM | POA: Diagnosis not present

## 2021-08-02 DIAGNOSIS — R110 Nausea: Secondary | ICD-10-CM | POA: Insufficient documentation

## 2021-08-02 DIAGNOSIS — G5603 Carpal tunnel syndrome, bilateral upper limbs: Secondary | ICD-10-CM | POA: Diagnosis not present

## 2021-08-02 DIAGNOSIS — M25531 Pain in right wrist: Secondary | ICD-10-CM | POA: Diagnosis present

## 2021-08-02 DIAGNOSIS — I10 Essential (primary) hypertension: Secondary | ICD-10-CM | POA: Diagnosis not present

## 2021-08-02 MED ORDER — NAPROXEN 500 MG PO TABS: 500.0000 mg | ORAL_TABLET | Freq: Two times a day (BID) | ORAL | 0 refills | Status: AC

## 2021-08-02 NOTE — ED Triage Notes (Signed)
Pt presents with c/o joint pain and diarrhea. Pt reports that she has carpal tunnel but does not currently have any medication for it. Pt also reports diarrhea, reports that her medicine for epilepsy has recently changed and is unsure if this is what is causing her to have diarrhea.

## 2021-08-02 NOTE — Discharge Instructions (Addendum)
You may alternate taking Tylenol and Naproxen as needed for pain control. You may take Naproxen twice daily as directed on your discharge paperwork and you may take  [id number] mg of Tylenol every 6 hours. Do not exceed 4000 mg of Tylenol daily as this can lead to liver damage. Also, make sure to take Naproxen with meals as it can cause an upset stomach. Do not take other NSAIDs while taking Naproxen such as (Aleve, Ibuprofen, Aspirin, Celebrex, etc) and do not take more than the prescribed dose as this can lead to ulcers and bleeding in your GI tract. You may use warm and cold compresses to help with your symptoms.   Use the wrist splints at night to help with your wrist pain  Please follow up with your primary doctor and orthopedics within the next 7-10 days for re-evaluation and further treatment of your symptoms.   Please return to the ER sooner if you have any new or worsening symptoms.

## 2021-08-02 NOTE — Progress Notes (Signed)
Orthopedic Tech Progress Note Patient Details:  [name] [name] [birth year] [id number]  Ortho Devices Type of Ortho Device: Velcro wrist splint Ortho Device/Splint Location: Bilateral wrist splints Ortho Device/Splint Interventions: Application   Post Interventions Patient Tolerated: Well Instructions Provided: Care of device  Saul Fordyce 08/02/2021, 3:29 PM

## 2021-08-02 NOTE — ED Provider Notes (Signed)
[facility]-EMERGENCY DEPT Provider Note   CSN: [id number] Arrival date & time: 08/02/21  1251     History Chief Complaint  Patient presents with   Joint Pain   Diarrhea    [name] [name] is a 40 y.o. female.  HPI  40 year old female with a history of anemia, bipolar disorder, gestational diabetes, hypertension, migraines, seizures, who presents to the emergency department today for evaluation of multiple complaints.  She is complaining of pain to her bilateral wrists that is constant in nature and worse with certain movements.  She states she has been diagnosed with carpal tunnel in the past but is not currently taking any medications for it.  Her symptoms feel consistent with this.  She further reports she has had some nausea and diarrhea.  She is on seizure medications and when she refilled her last prescription last week she got a different pill and is concerned that her symptoms could be related to this.  She does not report any abdominal pain vomiting, urinary symptoms, fevers or upper respiratory symptoms.  Past Medical History:  Diagnosis Date   Anemia    has taken iron supplements in the past   Bipolar affective disorder (HCC)    no meds currently;has been years since taking meds   Gestational diabetes    H/O varicella    Hypertension    Pt states she had pre-eclampsia w last pregnancy    Infection    Yeast inf;not frequent   Migraines    would be rx'd Percacet for relief   Pregnancy induced hypertension    Seizures (HCC) 05/2011   unsure what started seizures;Neurology appt on 05/30/12;on Kepra 2000mg  bid;was on Topramax 50mg  tid   Wears glasses    Yeast infection     There are no problems to display for this patient.   Past Surgical History:  Procedure Laterality Date   CESAREAN SECTION     CESAREAN SECTION N/A 12/26/2012   Procedure: Repeat cesarean section with delivery of baby;  Surgeon: , MD;  Location: [facility];  Service:  Obstetrics;  Laterality: N/A;   TONSILLECTOMY     During high school years   WISDOM TOOTH EXTRACTION     All 4 removed     OB History     Gravida  2   Para  2   Term  2   Preterm      AB      Living  2      SAB      IAB      Ectopic      Multiple      Live Births  2           Family History  Problem Relation Age of Onset   Cancer Mother        Ovarian   Diabetes Mother    Hypertension Father    Cirrhosis Father    Hypertension Brother    Asthma Son     Social History   Tobacco Use   Smoking status: Never   Smokeless tobacco: Never  Substance Use Topics   Alcohol use: No   Drug use: No    Home Medications Prior to Admission medications   Medication Sig Start Date End Date Taking? Authorizing Provider  naproxen (NAPROSYN) 500 MG tablet Take 1 tablet (500 mg total) by mouth 2 (two) times daily for 7 days. 08/02/21 08/09/21 Yes Jame Seelig S, PA-C  LamoTRIgine (LAMICTAL XR) 100 MG  TB24 Take 1 tablet by mouth daily.    [provider]    Allergies    Patient has no known allergies.  Review of Systems   Review of Systems  Constitutional:  Negative for fever.  HENT:  Negative for ear pain and sore throat.   Eyes:  Negative for visual disturbance.  Respiratory:  Negative for cough and shortness of breath.   Cardiovascular:  Negative for chest pain.  Gastrointestinal:  Positive for diarrhea and nausea. Negative for abdominal pain, constipation and vomiting.  Genitourinary:  Negative for dysuria and hematuria.  Musculoskeletal:  Negative for back pain.       Bilat wrist pain  Skin:  Negative for rash.  Neurological:  Negative for seizures and syncope.  All other systems reviewed and are negative.  Physical Exam Updated Vital Signs BP (!) 170/105 (BP Location: Left Arm)   Pulse 98   Temp (!) 97.5 F (36.4 C) (Oral)   Resp 18   LMP 08/01/2021   SpO2 94%   Physical Exam Vitals and nursing note reviewed.  Constitutional:       General: She is not in acute distress.    Appearance: She is well-developed.  HENT:     Head: Normocephalic and atraumatic.  Eyes:     Conjunctiva/sclera: Conjunctivae normal.  Cardiovascular:     Rate and Rhythm: Normal rate and regular rhythm.     Heart sounds: No murmur heard. Pulmonary:     Effort: Pulmonary effort is normal. No respiratory distress.     Breath sounds: Normal breath sounds.  Abdominal:     General: Bowel sounds are normal. There is no distension.     Palpations: Abdomen is soft.     Tenderness: There is no abdominal tenderness. There is no guarding or rebound.  Musculoskeletal:     Cervical back: Neck supple.     Comments: + tinnels sign bilat. Full and painless rom of the bilat wrists, radial pulses intact. Normal sensation throughout. Strong grip strength bilaterally.  Skin:    General: Skin is warm and dry.  Neurological:     Mental Status: She is alert.    ED Results / Procedures / Treatments   Labs (all labs ordered are listed, but only abnormal results are displayed) Labs Reviewed - No data to display   EKG None  Radiology No results found.  Procedures Procedures   Medications Ordered in ED Medications - No data to display  ED Course  I have reviewed the triage vital signs and the nursing notes.  Pertinent labs & imaging results that were available during my care of the patient were reviewed by me and considered in my medical decision making (see chart for details).    MDM Rules/Calculators/A&P                          40 year old female here for multiple complaints including bilateral wrist pain and nausea/diarrhea. Hx carpal tunnel.   Pt with positive tinnels bilaterally. Feel this is consistent with her chronic carpal tunnel. Bilat wrist splints given. Will given antiinflammatories and ortho f/u for this. No neuro deficits. Outpt f/u is appropriate.   With regard to the diarrhea, she has no abd tenderness or hematochezia. She  has no systemic symptoms. I offered labs and pt declined. She states she will f/u with her doctor and discuss her seizure medication as she thinks this is the cause. I doubt intraabdominal or pelvic emergency at  this time. I recommended she return if her sxs worsen ir persist and she is agreeable with this plan. She voices understanding of the plan and reasons to return. All questions answered, pt stable for discharge  Final Clinical Impression(s) / ED Diagnoses Final diagnoses:  Bilateral carpal tunnel syndrome    Rx / DC Orders ED Discharge Orders          Ordered    naproxen (NAPROSYN) 500 MG tablet  2 times daily        08/02/21 [address] [place], PA-C 08/02/21 1531    Lorre Nick, MD 08/02/21 2257

## 2021-08-02 NOTE — ED Provider Notes (Signed)
Emergency Medicine Provider Triage Evaluation Note  [name] [name] , a 40 y.o. female  was evaluated in triage.  Pt complains of bilat wrist pain consistent with her carpal tunnel. She further c/o nausea and diarrhea that started after she started a new seizure med.  Review of Systems  Positive: Carpal tunnel, nausea, diarrhea Negative: Abd pain, vomiting  Physical Exam  BP (!) 170/105 (BP Location: Left Arm)   Pulse 98   Temp (!) 97.5 F (36.4 C) (Oral)   Resp 18   SpO2 94%  Gen:   Awake, no distress   Resp:  Normal effort  MSK:   Moves extremities without difficulty   Medical Decision Making  Medically screening exam initiated at 1:12 PM.  Appropriate orders placed.  [name] [name] was informed that the remainder of the evaluation will be completed by another provider, this initial triage assessment does not replace that evaluation, and the importance of remaining in the ED until their evaluation is complete.      Karrie Meres, [place] 08/02/21 1314    Terald Sleeper, MD 08/02/21 [phone]

## 2021-09-01 ENCOUNTER — Emergency Department (HOSPITAL_COMMUNITY)
Admission: EM | Admit: 2021-09-01 | Discharge: 2021-09-01 | Disposition: A | Discharge status: Home / Self Care | Payer: Medicare Other | Attending: Emergency Medicine | Admitting: Emergency Medicine

## 2021-09-01 DIAGNOSIS — Z5321 Procedure and treatment not carried out due to patient leaving prior to being seen by health care provider: Secondary | ICD-10-CM | POA: Diagnosis not present

## 2021-09-01 DIAGNOSIS — L989 Disorder of the skin and subcutaneous tissue, unspecified: Secondary | ICD-10-CM | POA: Diagnosis not present

## 2021-09-01 DIAGNOSIS — H9201 Otalgia, right ear: Secondary | ICD-10-CM | POA: Insufficient documentation

## 2021-09-01 NOTE — ED Triage Notes (Signed)
Pt arrived via POV, c/o right ear pain/fullness and pain area of skin that appears to be scabbed over. Denies any known trauma to area.

## 2021-10-19 ENCOUNTER — Encounter (HOSPITAL_COMMUNITY): Payer: Self-pay

## 2021-10-19 ENCOUNTER — Other Ambulatory Visit: Payer: Self-pay

## 2021-10-19 ENCOUNTER — Emergency Department (HOSPITAL_COMMUNITY): Payer: Medicare Other

## 2021-10-19 ENCOUNTER — Emergency Department (HOSPITAL_COMMUNITY)
Admission: EM | Admit: 2021-10-19 | Discharge: 2021-10-19 | Disposition: A | Discharge status: Home / Self Care | Payer: Medicare Other | Attending: Emergency Medicine | Admitting: Emergency Medicine

## 2021-10-19 DIAGNOSIS — R0602 Shortness of breath: Secondary | ICD-10-CM | POA: Diagnosis present

## 2021-10-19 DIAGNOSIS — U071 COVID-19: Secondary | ICD-10-CM | POA: Insufficient documentation

## 2021-10-19 LAB — CBC WITH DIFFERENTIAL/PLATELET
Abs Immature Granulocytes: 0.01 10*3/uL (ref 0.00–0.07)
Basophils Absolute: 0 10*3/uL (ref 0.0–0.1)
Basophils Relative: 0 %
Eosinophils Absolute: 0 10*3/uL (ref 0.0–0.5)
Eosinophils Relative: 0 %
HCT: 45.4 % (ref 36.0–46.0)
Hemoglobin: 13.6 g/dL (ref 12.0–15.0)
Immature Granulocytes: 0 %
Lymphocytes Relative: 36 %
Lymphs Abs: 1.5 10*3/uL (ref 0.7–4.0)
MCH: 24.2 pg — ABNORMAL LOW (ref 26.0–34.0)
MCHC: 30 g/dL (ref 30.0–36.0)
MCV: 80.8 fL (ref 80.0–100.0)
Monocytes Absolute: 0.4 10*3/uL (ref 0.1–1.0)
Monocytes Relative: 9 %
Neutro Abs: 2.3 10*3/uL (ref 1.7–7.7)
Neutrophils Relative %: 55 %
Platelets: 252 10*3/uL (ref 150–400)
RBC: 5.62 MIL/uL — ABNORMAL HIGH (ref 3.87–5.11)
RDW: 16.7 % — ABNORMAL HIGH (ref 11.5–15.5)
WBC: 4.2 10*3/uL (ref 4.0–10.5)
nRBC: 0 % (ref 0.0–0.2)

## 2021-10-19 LAB — COMPREHENSIVE METABOLIC PANEL
ALT: 27 U/L (ref 0–44)
AST: 30 U/L (ref 15–41)
Albumin: 3.3 g/dL — ABNORMAL LOW (ref 3.5–5.0)
Alkaline Phosphatase: 75 U/L (ref 38–126)
Anion gap: 8 (ref 5–15)
BUN: 9 mg/dL (ref 6–20)
CO2: 23 mmol/L (ref 22–32)
Calcium: 8.4 mg/dL — ABNORMAL LOW (ref 8.9–10.3)
Chloride: 104 mmol/L (ref 98–111)
Creatinine, Ser: 0.53 mg/dL (ref 0.44–1.00)
GFR, Estimated: >60 mL/min (ref >60)
Glucose, Bld: 150 mg/dL — ABNORMAL HIGH (ref 70–99)
Potassium: 3.9 mmol/L (ref 3.5–5.1)
Sodium: 135 mmol/L (ref 135–145)
Total Bilirubin: 0.5 mg/dL (ref 0.3–1.2)
Total Protein: 7.1 g/dL (ref 6.5–8.1)

## 2021-10-19 LAB — I-STAT BETA HCG BLOOD, ED (MC, WL, AP ONLY): I-stat hCG, quantitative: <5 m[IU]/mL (ref <5)

## 2021-10-19 LAB — LACTIC ACID, PLASMA: Lactic Acid, Venous: 1.8 mmol/L (ref 0.5–1.9)

## 2021-10-19 LAB — RESP PANEL BY RT-PCR (FLU A&B, COVID) ARPGX2
Influenza A by PCR: NEGATIVE
Influenza B by PCR: NEGATIVE
SARS Coronavirus 2 by RT PCR: POSITIVE — AB

## 2021-10-19 MED ORDER — DEXAMETHASONE SODIUM PHOSPHATE 10 MG/ML IJ SOLN
10.0000 mg | Freq: Once | INTRAMUSCULAR | Status: AC
  Administered 2021-10-19: 10 mg via INTRAVENOUS
  Filled 2021-10-19: qty 1

## 2021-10-19 MED ORDER — MORPHINE SULFATE (PF) 4 MG/ML IV SOLN
6.0000 mg | Freq: Once | INTRAVENOUS | Status: AC
  Administered 2021-10-19: 6 mg via INTRAVENOUS
  Filled 2021-10-19: qty 2

## 2021-10-19 MED ORDER — NIRMATRELVIR/RITONAVIR (PAXLOVID)TABLET: 3.0000 | ORAL_TABLET | Freq: Two times a day (BID) | ORAL | 0 refills | Status: AC

## 2021-10-19 MED ORDER — SODIUM CHLORIDE 0.9 % IV SOLN: INTRAVENOUS | Status: DC

## 2021-10-19 NOTE — ED Triage Notes (Signed)
Pt c/o SOB, cough, headache x1 week. Pt states she has also had chills off and on. Pt 88% RA in triage. Pt 95% on 3L [place] O2.

## 2021-10-19 NOTE — Discharge Instructions (Addendum)
You were offered admission for treatment of your COVID infection.  You have deferred at this time.  The risk of death from low blood oxygen has been explained to you and you accept this.  You are strongly encouraged to return here if you change your mind and to take the Paxlovid as directed

## 2021-10-19 NOTE — ED Provider Notes (Signed)
[facility]-EMERGENCY DEPT Provider Note   CSN: [id number] Arrival date & time: 10/19/21  1104     History  Chief Complaint  Patient presents with   Shortness of Breath   Cough    [name] [name] is a 41 y.o. female.  41 year old female presents with URI symptoms x1 week.  She has had cough and congestion.  Mild headache.  No vomiting or diarrhea.  Has had increasing dyspnea on exertion.  No chest or abdominal discomfort.  Using over-the-counter medications without relief      Home Medications Prior to Admission medications   Medication Sig Start Date End Date Taking? Authorizing Provider  LamoTRIgine (LAMICTAL XR) 100 MG TB24 Take 1 tablet by mouth daily.    [provider]      Allergies    Patient has no known allergies.    Review of Systems   Review of Systems  All other systems reviewed and are negative.  Physical Exam Updated Vital Signs BP (!) 155/112    Pulse (!) 105    Temp 98.2 F (36.8 C) (Oral)    Resp 20    LMP 10/19/2021    SpO2 96%  Physical Exam Vitals and nursing note reviewed.  Constitutional:      General: She is not in acute distress.    Appearance: Normal appearance. She is well-developed. She is not toxic-appearing.  HENT:     Head: Normocephalic and atraumatic.  Eyes:     General: Lids are normal.     Conjunctiva/sclera: Conjunctivae normal.     Pupils: Pupils are equal, round, and reactive to light.  Neck:     Thyroid: No thyroid mass.     Trachea: No tracheal deviation.  Cardiovascular:     Rate and Rhythm: Regular rhythm. Tachycardia present.     Heart sounds: Normal heart sounds. No murmur heard.   No gallop.  Pulmonary:     Effort: Pulmonary effort is normal. No respiratory distress.     Breath sounds: Normal breath sounds. No stridor. No decreased breath sounds, wheezing, rhonchi or rales.  Abdominal:     General: There is no distension.     Palpations: Abdomen is soft.     Tenderness: There is no  abdominal tenderness. There is no rebound.  Musculoskeletal:        General: No tenderness. Normal range of motion.     Cervical back: Normal range of motion and neck supple.  Skin:    General: Skin is warm and dry.     Findings: No abrasion or rash.  Neurological:     Mental Status: She is alert and oriented to person, place, and time. Mental status is at baseline.     GCS: GCS eye subscore is 4. GCS verbal subscore is 5. GCS motor subscore is 6.     Cranial Nerves: No cranial nerve deficit.     Sensory: No sensory deficit.     Motor: Motor function is intact.  Psychiatric:        Attention and Perception: Attention normal.        Speech: Speech normal.        Behavior: Behavior normal.    ED Results / Procedures / Treatments   Labs (all labs ordered are listed, but only abnormal results are displayed) Labs Reviewed  CULTURE, BLOOD (ROUTINE X 2)  CULTURE, BLOOD (ROUTINE X 2)  RESP PANEL BY RT-PCR (FLU A&B, COVID) ARPGX2  CBC WITH DIFFERENTIAL/PLATELET  COMPREHENSIVE METABOLIC PANEL  LACTIC ACID, PLASMA  I-STAT BETA HCG BLOOD, ED ([facility], [facility], [facility] ONLY)    EKG None  Radiology No results found.  Procedures Procedures    Medications Ordered in ED Medications  0.9 %  sodium chloride infusion (has no administration in time range)    ED Course/ Medical Decision Making/ A&P                           Medical Decision Making  Patient hypoxic here and placed on supplemental oxygen.  COVID test positive.  Patient given IV Decadron here 10 mg.  Patient remains hypoxic but has capacity to make her own decisions.  Patient strongly offered admission but is deferred.  She request to sign out AMA at this time.  I will prescribe her Paxlovid and patient strongly encouraged to return here for admission if she gets worse.  CRITICAL CARE Performed by: Toy Baker Total critical care time: 50 minutes Critical care time was exclusive of separately billable procedures and treating  other patients. Critical care was necessary to treat or prevent imminent or life-threatening deterioration. Critical care was time spent personally by me on the following activities: development of treatment plan with patient and/or surrogate as well as nursing, discussions with consultants, evaluation of patient's response to treatment, examination of patient, obtaining history from patient or surrogate, ordering and performing treatments and interventions, ordering and review of laboratory studies, ordering and review of radiographic studies, pulse oximetry and re-evaluation of patient's condition.         Final Clinical Impression(s) / ED Diagnoses Final diagnoses:  None    Rx / DC Orders ED Discharge Orders     None         Lorre Nick, MD 10/19/21 1434

## 2021-10-19 NOTE — ED Notes (Signed)
Lab called in reguard to pending resp panel. Per lab staff, swab was resulted. Error in computer for results. Per lab, per COVID positive. MD made aware

## 2021-10-19 NOTE — ED Notes (Signed)
Pt left ED AMA. Aox4. All risks explained to pt by MD. Pt verbalizes understanding, states she is still leaving AMA.

## 2021-10-21 ENCOUNTER — Encounter (HOSPITAL_COMMUNITY): Payer: Self-pay | Admitting: *Deleted

## 2021-10-21 ENCOUNTER — Emergency Department (HOSPITAL_COMMUNITY)
Admission: EM | Admit: 2021-10-21 | Discharge: 2021-10-21 | Disposition: A | Discharge status: Home / Self Care | Payer: Medicare Other | Attending: Emergency Medicine | Admitting: Emergency Medicine

## 2021-10-21 ENCOUNTER — Other Ambulatory Visit: Payer: Self-pay

## 2021-10-21 ENCOUNTER — Emergency Department (HOSPITAL_COMMUNITY): Payer: Medicare Other

## 2021-10-21 DIAGNOSIS — U071 COVID-19: Secondary | ICD-10-CM | POA: Diagnosis present

## 2021-10-21 DIAGNOSIS — Z5321 Procedure and treatment not carried out due to patient leaving prior to being seen by health care provider: Secondary | ICD-10-CM | POA: Insufficient documentation

## 2021-10-21 DIAGNOSIS — R059 Cough, unspecified: Secondary | ICD-10-CM | POA: Diagnosis not present

## 2021-10-21 DIAGNOSIS — R0602 Shortness of breath: Secondary | ICD-10-CM | POA: Diagnosis not present

## 2021-10-21 LAB — BASIC METABOLIC PANEL
Anion gap: 5 (ref 5–15)
BUN: 9 mg/dL (ref 6–20)
CO2: 24 mmol/L (ref 22–32)
Calcium: 8.2 mg/dL — ABNORMAL LOW (ref 8.9–10.3)
Chloride: 108 mmol/L (ref 98–111)
Creatinine, Ser: 0.56 mg/dL (ref 0.44–1.00)
GFR, Estimated: >60 mL/min (ref >60)
Glucose, Bld: 117 mg/dL — ABNORMAL HIGH (ref 70–99)
Potassium: 3.6 mmol/L (ref 3.5–5.1)
Sodium: 137 mmol/L (ref 135–145)

## 2021-10-21 NOTE — ED Notes (Signed)
Pt gave registration her labels and left the facility.

## 2021-10-21 NOTE — ED Notes (Signed)
Save LT and LAV in main lab

## 2021-10-21 NOTE — ED Triage Notes (Signed)
Pt dx Sunday with Covid, was told to come back if symptoms worsen, states she is shob with cough, labored resp in triage. NO fever in triage, she is on Covid meds.

## 2021-10-23 ENCOUNTER — Other Ambulatory Visit: Payer: Self-pay

## 2021-10-23 ENCOUNTER — Ambulatory Visit
Admission: EM | Admit: 2021-10-23 | Discharge: 2021-10-23 | Discharge status: Absent without leave | Payer: Medicare Other

## 2021-10-24 LAB — CULTURE, BLOOD (ROUTINE X 2): Culture: NO GROWTH

## 2021-12-15 LAB — HM PAP SMEAR

## 2021-12-15 LAB — RESULTS CONSOLE HPV: CHL HPV: POSITIVE

## 2022-01-08 ENCOUNTER — Other Ambulatory Visit: Payer: Self-pay

## 2022-01-08 ENCOUNTER — Ambulatory Visit
Admission: EM | Admit: 2022-01-08 | Discharge: 2022-01-08 | Disposition: A | Discharge status: Home / Self Care | Payer: Medicare Other

## 2022-01-08 DIAGNOSIS — G8929 Other chronic pain: Secondary | ICD-10-CM

## 2022-01-08 DIAGNOSIS — L304 Erythema intertrigo: Secondary | ICD-10-CM | POA: Diagnosis not present

## 2022-01-08 DIAGNOSIS — R519 Headache, unspecified: Secondary | ICD-10-CM | POA: Diagnosis not present

## 2022-01-08 MED ORDER — NYSTATIN 100000 UNIT/GM EX CREA: TOPICAL_CREAM | CUTANEOUS | 0 refills | Status: DC
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
## 2022-03-11 ENCOUNTER — Emergency Department (HOSPITAL_COMMUNITY)
Admission: EM | Admit: 2022-03-11 | Discharge: 2022-03-11 | Disposition: A | Discharge status: Home / Self Care | Payer: Medicare Other | Attending: Emergency Medicine | Admitting: Emergency Medicine

## 2022-03-11 ENCOUNTER — Other Ambulatory Visit: Payer: Self-pay

## 2022-03-11 ENCOUNTER — Encounter (HOSPITAL_COMMUNITY): Payer: Self-pay

## 2022-03-11 ENCOUNTER — Emergency Department (HOSPITAL_COMMUNITY): Payer: Medicare Other

## 2022-03-11 DIAGNOSIS — R7401 Elevation of levels of liver transaminase levels: Secondary | ICD-10-CM | POA: Diagnosis not present

## 2022-03-11 DIAGNOSIS — Y92410 Unspecified street and highway as the place of occurrence of the external cause: Secondary | ICD-10-CM | POA: Diagnosis not present

## 2022-03-11 DIAGNOSIS — R7309 Other abnormal glucose: Secondary | ICD-10-CM | POA: Diagnosis not present

## 2022-03-11 DIAGNOSIS — G40909 Epilepsy, unspecified, not intractable, without status epilepticus: Secondary | ICD-10-CM | POA: Diagnosis not present

## 2022-03-11 DIAGNOSIS — R519 Headache, unspecified: Secondary | ICD-10-CM | POA: Diagnosis present

## 2022-03-11 DIAGNOSIS — M546 Pain in thoracic spine: Secondary | ICD-10-CM | POA: Insufficient documentation

## 2022-03-11 DIAGNOSIS — R1084 Generalized abdominal pain: Secondary | ICD-10-CM | POA: Diagnosis not present

## 2022-03-11 DIAGNOSIS — M545 Low back pain, unspecified: Secondary | ICD-10-CM | POA: Diagnosis not present

## 2022-03-11 LAB — I-STAT BETA HCG BLOOD, ED (MC, WL, AP ONLY): I-stat hCG, quantitative: <5 m[IU]/mL (ref <5)

## 2022-03-11 LAB — COMPREHENSIVE METABOLIC PANEL
ALT: 43 U/L (ref 0–44)
AST: 78 U/L — ABNORMAL HIGH (ref 15–41)
Albumin: 3 g/dL — ABNORMAL LOW (ref 3.5–5.0)
Alkaline Phosphatase: 63 U/L (ref 38–126)
Anion gap: 6 (ref 5–15)
BUN: 12 mg/dL (ref 6–20)
CO2: 21 mmol/L — ABNORMAL LOW (ref 22–32)
Calcium: 8.8 mg/dL — ABNORMAL LOW (ref 8.9–10.3)
Chloride: 113 mmol/L — ABNORMAL HIGH (ref 98–111)
Creatinine, Ser: 0.69 mg/dL (ref 0.44–1.00)
GFR, Estimated: >60 mL/min (ref >60)
Glucose, Bld: 218 mg/dL — ABNORMAL HIGH (ref 70–99)
Potassium: 3.7 mmol/L (ref 3.5–5.1)
Sodium: 140 mmol/L (ref 135–145)
Total Bilirubin: 0.2 mg/dL — ABNORMAL LOW (ref 0.3–1.2)
Total Protein: 6.3 g/dL — ABNORMAL LOW (ref 6.5–8.1)

## 2022-03-11 LAB — CBC WITH DIFFERENTIAL/PLATELET
Abs Immature Granulocytes: 0.04 10*3/uL (ref 0.00–0.07)
Basophils Absolute: 0.1 10*3/uL (ref 0.0–0.1)
Basophils Relative: 1 %
Eosinophils Absolute: 0.1 10*3/uL (ref 0.0–0.5)
Eosinophils Relative: 1 %
HCT: 42.4 % (ref 36.0–46.0)
Hemoglobin: 12.2 g/dL (ref 12.0–15.0)
Immature Granulocytes: 0 %
Lymphocytes Relative: 13 %
Lymphs Abs: 1.4 10*3/uL (ref 0.7–4.0)
MCH: 23.1 pg — ABNORMAL LOW (ref 26.0–34.0)
MCHC: 28.8 g/dL — ABNORMAL LOW (ref 30.0–36.0)
MCV: 80.5 fL (ref 80.0–100.0)
Monocytes Absolute: 0.6 10*3/uL (ref 0.1–1.0)
Monocytes Relative: 5 %
Neutro Abs: 8.8 10*3/uL — ABNORMAL HIGH (ref 1.7–7.7)
Neutrophils Relative %: 80 %
Platelets: 311 10*3/uL (ref 150–400)
RBC: 5.27 MIL/uL — ABNORMAL HIGH (ref 3.87–5.11)
RDW: 16.6 % — ABNORMAL HIGH (ref 11.5–15.5)
WBC: 11 10*3/uL — ABNORMAL HIGH (ref 4.0–10.5)
nRBC: 0 % (ref 0.0–0.2)

## 2022-03-11 MED ORDER — IOHEXOL 300 MG/ML  SOLN
100.0000 mL | Freq: Once | INTRAMUSCULAR | Status: AC | PRN
  Administered 2022-03-11: 100 mL via INTRAVENOUS

## 2022-03-11 MED ORDER — MORPHINE SULFATE (PF) 4 MG/ML IV SOLN
4.0000 mg | Freq: Once | INTRAVENOUS | Status: AC
  Administered 2022-03-11: 4 mg via INTRAVENOUS
  Filled 2022-03-11: qty 1

## 2022-03-11 MED ORDER — CYCLOBENZAPRINE HCL 7.5 MG PO TABS: 7.5000 mg | ORAL_TABLET | Freq: Three times a day (TID) | ORAL | 0 refills | Status: DC | PRN

## 2022-03-11 MED ORDER — HYDROMORPHONE HCL 1 MG/ML IJ SOLN
1.0000 mg | Freq: Once | INTRAMUSCULAR | Status: AC
  Administered 2022-03-11: 1 mg via INTRAVENOUS
  Filled 2022-03-11: qty 1
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
## 2022-03-24 ENCOUNTER — Encounter: Payer: Self-pay | Admitting: Neurology
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
## 2022-06-16 ENCOUNTER — Ambulatory Visit (INDEPENDENT_AMBULATORY_CARE_PROVIDER_SITE_OTHER): Payer: Medicare Other | Admitting: Neurology

## 2022-06-16 ENCOUNTER — Encounter: Payer: Self-pay | Admitting: Neurology

## 2022-06-16 ENCOUNTER — Other Ambulatory Visit (INDEPENDENT_AMBULATORY_CARE_PROVIDER_SITE_OTHER): Payer: Medicare Other

## 2022-06-16 VITALS — BP 144/88 | HR 95 | Ht 61.0 in | Wt 364.4 lb

## 2022-06-16 DIAGNOSIS — G5603 Carpal tunnel syndrome, bilateral upper limbs: Secondary | ICD-10-CM | POA: Diagnosis not present

## 2022-06-16 DIAGNOSIS — Z79899 Other long term (current) drug therapy: Secondary | ICD-10-CM

## 2022-06-16 DIAGNOSIS — G43009 Migraine without aura, not intractable, without status migrainosus: Secondary | ICD-10-CM

## 2022-06-16 DIAGNOSIS — G40109 Localization-related (focal) (partial) symptomatic epilepsy and epileptic syndromes with simple partial seizures, not intractable, without status epilepticus: Secondary | ICD-10-CM

## 2022-06-16 DIAGNOSIS — E559 Vitamin D deficiency, unspecified: Secondary | ICD-10-CM

## 2022-06-16 DIAGNOSIS — R0681 Apnea, not elsewhere classified: Secondary | ICD-10-CM | POA: Diagnosis not present

## 2022-06-16 MED ORDER — TOPIRAMATE 200 MG PO TABS: 200.0000 mg | ORAL_TABLET | Freq: Two times a day (BID) | ORAL | 5 refills | Status: DC

## 2022-06-16 MED ORDER — PHENYTOIN SODIUM EXTENDED 100 MG PO CAPS: 200.0000 mg | ORAL_CAPSULE | Freq: Two times a day (BID) | ORAL | 5 refills | Status: DC

## 2022-06-16 MED ORDER — AIMOVIG 140 MG/ML ~~LOC~~ SOAJ: 140.0000 mg | SUBCUTANEOUS | 5 refills | Status: DC

## 2022-06-16 MED ORDER — TIZANIDINE HCL 4 MG PO TABS: 4.0000 mg | ORAL_TABLET | Freq: Every day | ORAL | 5 refills | Status: DC

## 2022-06-16 MED ORDER — GABAPENTIN 300 MG PO CAPS: 300.0000 mg | ORAL_CAPSULE | Freq: Two times a day (BID) | ORAL | 5 refills | Status: DC

## 2022-06-16 NOTE — Patient Instructions (Addendum)
Refilled: Topiramate 200mg  twice daily Phenytoin 200mg  twice daily Gabapentin 300mg  twice daily Tizanidine 4mg  daily Take calcium 600mg  and D3 800mg  daily when taking Dilantin as it causes brittle bones Check CBC, CMP, vit D level No driving for 6 months from last seizure as per [place] law For migraine, will start Aimovig 140mg  every 28 days Refer to sleep medicine to evaluate for sleep apnea Follow up 4 months.

## 2022-06-17 LAB — COMPREHENSIVE METABOLIC PANEL
ALT: 18 U/L (ref 0–35)
AST: 24 U/L (ref 0–37)
Albumin: 4 g/dL (ref 3.5–5.2)
Alkaline Phosphatase: 79 U/L (ref 39–117)
BUN: 10 mg/dL (ref 6–23)
CO2: 25 mEq/L (ref 19–32)
Calcium: 9.2 mg/dL (ref 8.4–10.5)
Chloride: 102 mEq/L (ref 96–112)
Creatinine, Ser: 0.6 mg/dL (ref 0.40–1.20)
GFR: 112.75 mL/min (ref >60.00)
Glucose, Bld: 139 mg/dL — ABNORMAL HIGH (ref 70–99)
Potassium: 4.2 mEq/L (ref 3.5–5.1)
Sodium: 137 mEq/L (ref 135–145)
Total Bilirubin: 0.3 mg/dL (ref 0.2–1.2)
Total Protein: 7.7 g/dL (ref 6.0–8.3)

## 2022-06-17 LAB — CBC
HCT: 41.1 % (ref 36.0–46.0)
Hemoglobin: 12.7 g/dL (ref 12.0–15.0)
MCHC: 31 g/dL (ref 30.0–36.0)
MCV: 77.1 fl — ABNORMAL LOW (ref 78.0–100.0)
Platelets: 378 10*3/uL (ref 150.0–400.0)
RBC: 5.33 Mil/uL — ABNORMAL HIGH (ref 3.87–5.11)
RDW: 16.6 % — ABNORMAL HIGH (ref 11.5–15.5)
WBC: 5.2 10*3/uL (ref 4.0–10.5)

## 2022-06-17 LAB — VITAMIN D 25 HYDROXY (VIT D DEFICIENCY, FRACTURES): VITD: 7.6 ng/mL — ABNORMAL LOW (ref 30.00–100.00)

## 2022-06-24 ENCOUNTER — Other Ambulatory Visit: Payer: Self-pay | Admitting: Neurology

## 2022-06-24 NOTE — Progress Notes (Signed)
Patient advised of her labs.

## 2022-07-06 ENCOUNTER — Encounter: Payer: Self-pay | Admitting: Family Medicine

## 2022-07-06 ENCOUNTER — Ambulatory Visit: Payer: Medicare Other | Attending: Family Medicine | Admitting: Family Medicine

## 2022-07-06 VITALS — BP 147/82 | HR 98 | Temp 98.7°F | Ht 61.0 in | Wt 370.4 lb

## 2022-07-06 DIAGNOSIS — Z7984 Long term (current) use of oral hypoglycemic drugs: Secondary | ICD-10-CM | POA: Insufficient documentation

## 2022-07-06 DIAGNOSIS — F319 Bipolar disorder, unspecified: Secondary | ICD-10-CM | POA: Insufficient documentation

## 2022-07-06 DIAGNOSIS — Z79899 Other long term (current) drug therapy: Secondary | ICD-10-CM | POA: Insufficient documentation

## 2022-07-06 DIAGNOSIS — G40909 Epilepsy, unspecified, not intractable, without status epilepticus: Secondary | ICD-10-CM

## 2022-07-06 DIAGNOSIS — Z6841 Body Mass Index (BMI) 40.0 and over, adult: Secondary | ICD-10-CM | POA: Diagnosis not present

## 2022-07-06 DIAGNOSIS — E1169 Type 2 diabetes mellitus with other specified complication: Secondary | ICD-10-CM

## 2022-07-06 DIAGNOSIS — R030 Elevated blood-pressure reading, without diagnosis of hypertension: Secondary | ICD-10-CM

## 2022-07-06 DIAGNOSIS — F3161 Bipolar disorder, current episode mixed, mild: Secondary | ICD-10-CM | POA: Diagnosis not present

## 2022-07-06 DIAGNOSIS — E119 Type 2 diabetes mellitus without complications: Secondary | ICD-10-CM | POA: Insufficient documentation

## 2022-07-06 DIAGNOSIS — M170 Bilateral primary osteoarthritis of knee: Secondary | ICD-10-CM | POA: Insufficient documentation

## 2022-07-06 DIAGNOSIS — G43709 Chronic migraine without aura, not intractable, without status migrainosus: Secondary | ICD-10-CM

## 2022-07-06 LAB — POCT GLYCOSYLATED HEMOGLOBIN (HGB A1C): HbA1c, POC (controlled diabetic range): 9.7 % — AB (ref 0.0–7.0)

## 2022-07-06 MED ORDER — OZEMPIC (0.25 OR 0.5 MG/DOSE) 2 MG/1.5ML ~~LOC~~ SOPN: 0.5000 mg | PEN_INJECTOR | SUBCUTANEOUS | 1 refills | Status: DC

## 2022-07-06 MED ORDER — OZEMPIC (0.25 OR 0.5 MG/DOSE) 2 MG/1.5ML ~~LOC~~ SOPN: 0.2500 mg | PEN_INJECTOR | SUBCUTANEOUS | 0 refills | Status: DC

## 2022-07-06 MED ORDER — FREESTYLE LIBRE 3 SENSOR MISC: 1.0000 | 11 refills | Status: DC

## 2022-07-06 NOTE — Progress Notes (Signed)
Subjective:  Patient ID: [name] [name], female    DOB: [birth year]  Age: 41 y.o. MRN: [id number]  CC: New Patient (Initial Visit)   HPI [name] [name] is a 41 y.o. year old female with a history of Epilepsy, Migraines, Bipolar disorder, Carpal tunnel syndrome, OA of the knees, type 2 diabetes mellitus A1c 9.7. Moved here from [place].  Interval History: A1c is 9.7 up from 6.2 previously.  She does have a history of gestational diabetes mellitus. She was on Metformin and stopped due to side effects.  Currently does not take any medications.  Last seziure was in 02/2022 and she is under the care of neurology.  Currently does not drive.  She is not on Bipolar medications and is not interested in commencing medication.  She is in the process of seeking for a new psychiatrist. Blood pressure is elevated and she denies a history of hypertension. Past Medical History:  Diagnosis Date   Anemia    has taken iron supplements in the past   Arthritis    Bipolar affective disorder ([place])    no meds currently;has been years since taking meds   Carpal tunnel syndrome on both sides    Gestational diabetes    H/O varicella    Hypertension    Pt states she had pre-eclampsia w last pregnancy    Infection    Yeast inf;not frequent   Migraines    would be rx'd Percacet for relief   Pregnancy induced hypertension    Seizures ([place]) 05/20/2011   unsure what started seizures;Neurology appt on 05/30/12;on Kepra 2000mg  bid;was on Topramax 50mg  tid   Wears glasses    Yeast infection     Past Surgical History:  Procedure Laterality Date   CESAREAN SECTION     CESAREAN SECTION N/A 12/26/2012   Procedure: Repeat cesarean section with delivery of baby;  Surgeon: Emily Filbert, MD;  Location: [facility];  Service: Obstetrics;  Laterality: N/A;   TONSILLECTOMY     During high school years   1 TOOTH EXTRACTION     All 4 removed    Family History  Problem Relation Age of Onset   Cancer Mother         Ovarian   Diabetes Mother    Hypertension Father    Cirrhosis Father    Hypertension Brother    Asthma Son    Migraines Daughter     Social History   Socioeconomic History   Marital status: Single    Spouse name: Not on file   Number of children: 1   Years of education: 13   Highest education level: Not on file  Occupational History    Comment: Pt is disabled  Tobacco Use   Smoking status: Never   Smokeless tobacco: Never  Substance and Sexual Activity   Alcohol use: No   Drug use: No   Sexual activity: Yes    Partners: Male    Birth control/protection: None    Comment: undecided on birth control  method  Other Topics Concern   Not on file  Social History Narrative   Not on file   Social Determinants of Health   Financial Resource Strain: Not on file  Food Insecurity: Not on file  Transportation Needs: Not on file  Physical Activity: Not on file  Stress: Not on file  Social Connections: Not on file    No Known Allergies  Outpatient Medications Prior to Visit  Medication Sig Dispense Refill   cyclobenzaprine (FEXMID)  7.5 MG tablet Take 1 tablet (7.5 mg total) by mouth 3 (three) times daily as needed for muscle spasms. 15 tablet 0   furosemide (LASIX) 20 MG tablet Take 20 mg by mouth.     gabapentin (NEURONTIN) 300 MG capsule Take 1 capsule (300 mg total) by mouth 2 (two) times daily. 60 capsule 5   phenytoin (DILANTIN) 100 MG ER capsule Take 2 capsules (200 mg total) by mouth 2 (two) times daily. 120 capsule 5   tiZANidine (ZANAFLEX) 4 MG tablet Take 1 tablet (4 mg total) by mouth daily. 30 tablet 5   topiramate (TOPAMAX) 200 MG tablet Take 1 tablet (200 mg total) by mouth 2 (two) times daily. 60 tablet 5   Erenumab-aooe (AIMOVIG) 140 MG/ML SOAJ Inject 140 mg into the skin every 28 (twenty-eight) days. 1.12 mL 5   metFORMIN (GLUCOPHAGE) 1000 MG tablet Take 1,000 mg by mouth 2 (two) times daily with a meal. (Patient not taking: Reported on 07/06/2022)     nystatin  cream (MYCOSTATIN) Apply to affected area 2 times daily (Patient not taking: Reported on 07/06/2022) 30 g 0   phenytoin (DILANTIN) 50 MG tablet Chew by mouth. (Patient not taking: Reported on 07/06/2022)     No facility-administered medications prior to visit.     ROS Review of Systems  Constitutional:  Negative for activity change and appetite change.  HENT:  Negative for sinus pressure and sore throat.   Respiratory:  Negative for chest tightness, shortness of breath and wheezing.   Cardiovascular:  Negative for chest pain and palpitations.  Gastrointestinal:  Negative for abdominal distention, abdominal pain and constipation.  Genitourinary: Negative.   Musculoskeletal: Negative.   Psychiatric/Behavioral:  Negative for behavioral problems and dysphoric mood.     Objective:  BP (!) 147/82   Pulse 98   Temp 98.7 F (37.1 C) (Oral)   Ht 5\' 1"  (1.549 m)   Wt (!) 370 lb 6.4 oz (168 kg)   LMP 06/17/2022   SpO2 98%   BMI 69.99 kg/m      07/06/2022    2:16 PM 06/16/2022    2:47 PM 03/11/2022    2:30 PM  BP/Weight  Systolic BP 147 144 134  Diastolic BP 82 88 84  Wt. (Lbs) 370.4 364.4   BMI 69.99 kg/m2 68.85 kg/m2       Physical Exam Constitutional:      Appearance: She is well-developed. She is obese.  Cardiovascular:     Rate and Rhythm: Normal rate.     Heart sounds: Normal heart sounds. No murmur heard. Pulmonary:     Effort: Pulmonary effort is normal.     Breath sounds: Normal breath sounds. No wheezing or rales.  Chest:     Chest wall: No tenderness.  Abdominal:     General: Bowel sounds are normal. There is no distension.     Palpations: Abdomen is soft. There is no mass.     Tenderness: There is no abdominal tenderness.  Musculoskeletal:        General: Normal range of motion.     Right lower leg: No edema.     Left lower leg: No edema.  Neurological:     Mental Status: She is alert and oriented to person, place, and time.  Psychiatric:        Mood and  Affect: Mood normal.        Latest Ref Rng & Units 06/16/2022    3:38 PM 03/11/2022   11:22 AM  10/21/2021   10:53 AM  CMP  Glucose 70 - 99 mg/dL 811  031  594   BUN 6 - 23 mg/dL 10  12  9    Creatinine 0.40 - 1.20 mg/dL  5.85  9.29   Sodium 135 - 145 mEq/L 137  140  137   Potassium 3.5 - 5.1 mEq/L 4.2  3.7  3.6   Chloride 96 - 112 mEq/L 102  113  108   CO2 19 - 32 mEq/L 25  21  24    Calcium 8.4 - 10.5 mg/dL 9.2  8.8  8.2   Total Protein 6.0 - 8.3 g/dL 7.7  6.3    Total Bilirubin 0.2 - 1.2 mg/dL 0.3  0.2    Alkaline Phos 39 - 117 U/L 79  63    AST 0 - 37 U/L 24  78    ALT 0 - 35 U/L 18  43      Lipid Panel  No results found for: "CHOL", "TRIG", "HDL", "CHOLHDL", "VLDL", "LDLCALC", "LDLDIRECT"  CBC    Component Value Date/Time   WBC 5.2 06/16/2022 1538   RBC 5.33 (H) 06/16/2022 1538   HGB 12.7 06/16/2022 1538   HCT 41.1 06/16/2022 1538   PLT 378.0 06/16/2022 1538   MCV 77.1 (L) 06/16/2022 1538   MCH 23.1 (L) 03/11/2022 1122   MCHC 31.0 06/16/2022 1538   RDW 16.6 (H) 06/16/2022 1538   LYMPHSABS 1.4 03/11/2022 1122   MONOABS 0.6 03/11/2022 1122   EOSABS 0.1 03/11/2022 1122   BASOSABS 0.1 03/11/2022 1122    Lab Results  Component Value Date   HGBA1C 9.7 (A) 07/06/2022    Assessment & Plan:  1. Type 2 diabetes mellitus with other specified complication, without long-term current use of insulin (HCC) Uncontrolled with A1c of 9.7 Goal is less than 7.0 We will initiate Ozempic after shared decision making.  Discussed medication adverse effects, weight loss and cardiovascular benefits. She declined waiting for education on medication administration by clinical pharmacist Counseled on Diabetic diet, my plate method, 03/13/2022 minutes of moderate intensity exercise/week Blood sugar logs with fasting goals of 80-120 mg/dl, random of less than 07/08/2022 and in the event of sugars less than 60 mg/dl or greater than 628 mg/dl encouraged to notify the clinic. Advised on the need for  annual eye exams, annual foot exams, Pneumonia vaccine. - POCT glycosylated hemoglobin (Hb A1C) - Semaglutide,0.25 or 0.5MG /DOS, (OZEMPIC, 0.25 OR 0.5 MG/DOSE,) 2 MG/1.5ML SOPN; Inject 0.25 mg into the skin once a week. For 4 weeks then increase to 0.5 mg weekly  Dispense: 2 mL; Refill: 0 - Semaglutide,0.25 or 0.5MG /DOS, (OZEMPIC, 0.25 OR 0.5 MG/DOSE,) 2 MG/1.5ML SOPN; Inject 0.5 mg into the skin once a week. After completion of 4 weeks of 0.25 mg  Dispense: 2 mL; Refill: 1 - Continuous Blood Gluc Sensor (FREESTYLE LIBRE 3 SENSOR) MISC; 1 each by Does not apply route every 14 (fourteen) days. Place 1 sensor on the skin every 14 days. Use to check glucose continuously  Dispense: 2 each; Refill: 11  2. Bipolar disorder, current episode mixed, mild (HCC) Stable She will be making an appointment with a Psychiatrist  3. Morbid obesity with BMI of 60.0-69.9, adult (HCC) Counseled on caloric restriction, increasing physical activity Initiation of GLP-1 RA will be beneficial  4. Elevated blood pressure reading She denies history of hypertension Work on lifestyle modification and we will reassess BP at next visit  5. Chronic migraine without aura without status migrainosus, not  intractable Stable Management as per Neurology  6. Nonintractable epilepsy without status epilepticus, unspecified epilepsy type (HCC) Last episode of seizures was 3 months ago Continue antiseizure medication Follow-up with neurology [place] laws regarding driving discussed and she currently does not drive    Health Care Maintenance: Last PAP smear was in the last year.  Meds ordered this encounter  Medications   Semaglutide,0.25 or 0.5MG /DOS, (OZEMPIC, 0.25 OR 0.5 MG/DOSE,) 2 MG/1.5ML SOPN    Sig: Inject 0.25 mg into the skin once a week. For 4 weeks then increase to 0.5 mg weekly    Dispense:  2 mL    Refill:  0   Semaglutide,0.25 or 0.5MG /DOS, (OZEMPIC, 0.25 OR 0.5 MG/DOSE,) 2 MG/1.5ML SOPN    Sig: Inject 0.5 mg  into the skin once a week. After completion of 4 weeks of 0.25 mg    Dispense:  2 mL    Refill:  1    Follow-up: Return in about 1 month (around 08/05/2022) for Diabetes follow-up.       Hoy RegisterEnobong Canaan Holzer, MD, FAAFP. [facility] [place], [place] [phone]   07/06/2022, 5:42 PM

## 2022-07-06 NOTE — Progress Notes (Signed)
A1C- History of epilepsy,bipolar disorder. Not taking metformin due to side effects.

## 2022-07-07 ENCOUNTER — Encounter: Payer: Self-pay | Admitting: Family Medicine

## 2022-07-07 ENCOUNTER — Telehealth: Payer: Self-pay | Admitting: Emergency Medicine

## 2022-07-07 NOTE — Telephone Encounter (Signed)
Ok to write letter

## 2022-07-07 NOTE — Telephone Encounter (Signed)
Copied from [place] [phone]. Topic: General - Other >> Jul 07, 2022 11:55 AM Chapman Fitch wrote: Reason for CRM: Pt had an appt yesterday and her daughter([name] [name])  was with her / pt needs a note for the daughter school for her absence /please email letter to [email or web address] / please advise

## 2022-07-08 ENCOUNTER — Institutional Professional Consult (permissible substitution): Payer: Medicare Other | Admitting: Nurse Practitioner

## 2022-07-08 NOTE — Telephone Encounter (Signed)
Yes

## 2022-07-08 NOTE — Telephone Encounter (Signed)
Letter has been emailed to address provided a copy has been placed upfront also.

## 2022-07-14 ENCOUNTER — Telehealth: Payer: Self-pay | Admitting: Neurology

## 2022-07-14 NOTE — Telephone Encounter (Signed)
Pt called in and left a message with the access nurse. She has epilepsy and headaches and she was told that public transportation buses are not the best form of transportation with her. A public transit [employer] or car would be better suited for her due to the epilepsy. So she needs a note from the doctor requesting her to be able to take the cars and vans.

## 2022-07-16 ENCOUNTER — Institutional Professional Consult (permissible substitution): Payer: Medicare Other | Admitting: Primary Care

## 2022-07-16 NOTE — Telephone Encounter (Signed)
Per patient she wants the letter to say that the noise from the [place] causes or triggers a seizures.

## 2022-07-17 ENCOUNTER — Encounter: Payer: Self-pay | Admitting: Neurology

## 2022-07-17 NOTE — Telephone Encounter (Signed)
Called and informed pt that letter was ready and she would like it to be mail. Done.

## 2022-07-20 NOTE — Telephone Encounter (Signed)
Letter mail

## 2022-07-21 ENCOUNTER — Institutional Professional Consult (permissible substitution): Payer: Medicare Other | Admitting: Primary Care

## 2022-08-03 ENCOUNTER — Ambulatory Visit: Payer: Medicare Other | Admitting: Family Medicine

## 2022-08-07 ENCOUNTER — Telehealth: Payer: Self-pay | Admitting: Neurology

## 2022-08-07 NOTE — Telephone Encounter (Signed)
Pt called in stating Dr. Tomi Likens filled out [facility] paperwork for her, but it was listed as a temporary condition. She says she has had the epilepsy since 2012 and will need the paperwork to be filled out again. She had several questions as well about the paperwork.

## 2022-08-07 NOTE — Telephone Encounter (Signed)
Per patient, a letter was sent over to her stating they need to know when her condition started wasn't on the form filled out in august.  Advised patient that the from doesn't state that. If she could send in the letter she received or sign up for mychart to scan it in to her chart. Since patient is unable to get to the office.

## 2022-08-31 NOTE — Telephone Encounter (Signed)
Patient called to follow up about this. She said she was called about this and she is still needing transportation help.  She said she didn't receive a letter, was a verbal request.

## 2022-08-31 NOTE — Telephone Encounter (Signed)
Called and told pt that papers was re faxed with Dr. Moises Blood notes of onset. She understood.

## 2022-09-28 ENCOUNTER — Telehealth: Payer: Self-pay | Admitting: Neurology

## 2022-09-28 NOTE — Telephone Encounter (Signed)
Pt called in stating the [employer] [place] paperwork that was filled out back in October was not filled out correctly. She stated because she has epilepsy she should not have to walk out to the vehicle, they need to send someone to her door. She also is only supposed to ride in a car and the paperwork shows she can ride in a bus or a car.

## 2022-09-28 NOTE — Telephone Encounter (Signed)
Telephone call to patient, Letter sent 07/18/22 for vehicle change to car only, as for door to door services please call the transportation office to see how that works.

## 2022-09-29 NOTE — Telephone Encounter (Signed)
Pt called back in. She stated the [employer] [place] is a part of [employer] so they don't just go door to door unless it's specified. She says her epilepsy means she needs door to door help because she could have a seizure and fall if she doesn't have help. She also mentioned she needs car service and cannot ride the bus because that is worse for her.

## 2022-10-01 NOTE — Telephone Encounter (Signed)
Patient is returning call, wondered if we have re-faxed the information to the company and asking if someone is able to return a call. Asked if she does not pick up to [facility] if possible.

## 2022-10-02 NOTE — Telephone Encounter (Signed)
Patient advised base off the conversation she had with Dr.jaffe at her last visit she stated that she is able to walk at least 900 ft. Letter for Car transportation instead of [place] or bus was faxed over to [facility].  Per Patient she was advised they never received the letter. And any noise can trigger her seizures.
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
## 2022-11-10 ENCOUNTER — Ambulatory Visit: Payer: Medicare Other | Admitting: Neurology

## 2022-11-10 ENCOUNTER — Ambulatory Visit: Payer: Medicare Other | Admitting: Family Medicine
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
## 2022-11-13 ENCOUNTER — Other Ambulatory Visit (INDEPENDENT_AMBULATORY_CARE_PROVIDER_SITE_OTHER): Payer: Medicare Other

## 2022-11-13 ENCOUNTER — Encounter: Payer: Self-pay | Admitting: Neurology

## 2022-11-13 ENCOUNTER — Ambulatory Visit (INDEPENDENT_AMBULATORY_CARE_PROVIDER_SITE_OTHER): Payer: Medicare Other | Admitting: Neurology

## 2022-11-13 VITALS — BP 140/97 | HR 93 | Ht 61.0 in | Wt 365.8 lb

## 2022-11-13 DIAGNOSIS — G5603 Carpal tunnel syndrome, bilateral upper limbs: Secondary | ICD-10-CM | POA: Diagnosis not present

## 2022-11-13 DIAGNOSIS — G40109 Localization-related (focal) (partial) symptomatic epilepsy and epileptic syndromes with simple partial seizures, not intractable, without status epilepticus: Secondary | ICD-10-CM | POA: Diagnosis not present

## 2022-11-13 DIAGNOSIS — Z79899 Other long term (current) drug therapy: Secondary | ICD-10-CM

## 2022-11-13 DIAGNOSIS — G43009 Migraine without aura, not intractable, without status migrainosus: Secondary | ICD-10-CM

## 2022-11-13 LAB — COMPREHENSIVE METABOLIC PANEL
ALT: 20 U/L (ref 0–35)
AST: 23 U/L (ref 0–37)
Albumin: 3.9 g/dL (ref 3.5–5.2)
Alkaline Phosphatase: 80 U/L (ref 39–117)
BUN: 11 mg/dL (ref 6–23)
CO2: 23 mEq/L (ref 19–32)
Calcium: 8.5 mg/dL (ref 8.4–10.5)
Chloride: 107 mEq/L (ref 96–112)
Creatinine, Ser: 0.59 mg/dL (ref 0.40–1.20)
GFR: 112.89 mL/min (ref >60.00)
Glucose, Bld: 236 mg/dL — ABNORMAL HIGH (ref 70–99)
Potassium: 3.9 mEq/L (ref 3.5–5.1)
Sodium: 138 mEq/L (ref 135–145)
Total Bilirubin: 0.2 mg/dL (ref 0.2–1.2)
Total Protein: 7.1 g/dL (ref 6.0–8.3)

## 2022-11-13 LAB — CBC
HCT: 40.9 % (ref 36.0–46.0)
Hemoglobin: 12.9 g/dL (ref 12.0–15.0)
MCHC: 31.4 g/dL (ref 30.0–36.0)
MCV: 80.7 fl (ref 78.0–100.0)
Platelets: 339 10*3/uL (ref 150.0–400.0)
RBC: 5.07 Mil/uL (ref 3.87–5.11)
RDW: 16 % — ABNORMAL HIGH (ref 11.5–15.5)
WBC: 5.6 10*3/uL (ref 4.0–10.5)

## 2022-11-13 MED ORDER — GABAPENTIN 300 MG PO CAPS: 300.0000 mg | ORAL_CAPSULE | Freq: Two times a day (BID) | ORAL | 5 refills | Status: DC

## 2022-11-13 MED ORDER — LACOSAMIDE 50 MG PO TABS: 50.0000 mg | ORAL_TABLET | Freq: Two times a day (BID) | ORAL | 11 refills | Status: AC

## 2022-11-13 MED ORDER — AIMOVIG 140 MG/ML ~~LOC~~ SOAJ
140.0000 mg | SUBCUTANEOUS | 11 refills | Status: DC
Start: 2022-11-13 — End: 2023-06-23

## 2022-11-13 MED ORDER — PHENYTOIN SODIUM EXTENDED 100 MG PO CAPS: 200.0000 mg | ORAL_CAPSULE | Freq: Two times a day (BID) | ORAL | 5 refills | Status: DC

## 2022-11-13 MED ORDER — TOPIRAMATE 200 MG PO TABS: 200.0000 mg | ORAL_TABLET | Freq: Two times a day (BID) | ORAL | 5 refills | Status: DC

## 2022-11-13 NOTE — Patient Instructions (Signed)
Continue Dilantin 200mg  twice daily and topiramate 200mg  twice daily and gabapentin Start lacosamide 50mg  twice daily Start Aimovig every 30 days Check CBC, CMP, Dilantin level Follow up 6 months.

## 2022-11-14 LAB — PHENYTOIN LEVEL, TOTAL: Phenytoin, Total: 4.5 mg/L — ABNORMAL LOW (ref 10.0–20.0)

## 2022-12-28 ENCOUNTER — Ambulatory Visit: Payer: Medicare Other | Admitting: Family Medicine

## 2022-12-30 ENCOUNTER — Other Ambulatory Visit (HOSPITAL_COMMUNITY): Payer: Self-pay

## 2023-01-13 ENCOUNTER — Telehealth: Payer: Self-pay

## 2023-01-13 ENCOUNTER — Other Ambulatory Visit (HOSPITAL_COMMUNITY): Payer: Self-pay

## 2023-01-13 NOTE — Telephone Encounter (Signed)
Patient Advocate Encounter  Prior Authorization for Aimovig 140MG /ML auto-injectors has been approved through [employer].  KeyOA:[id number]    Effective: 01-13-2023 to 10-19-2023

## 2023-01-13 NOTE — Telephone Encounter (Signed)
Patient Advocate Encounter   Received notification from [facility] that prior authorization is required for Aimovig 140MG /ML auto-injectors   Submitted: 01-13-2023 Key  [id number]  Status is pending

## 2023-02-07 ENCOUNTER — Ambulatory Visit
Admission: EM | Admit: 2023-02-07 | Discharge: 2023-02-07 | Discharge status: Against Medical Advice | Payer: Medicare Other

## 2023-03-03 ENCOUNTER — Telehealth: Payer: Self-pay | Admitting: Neurology

## 2023-03-03 ENCOUNTER — Ambulatory Visit
Admission: EM | Admit: 2023-03-03 | Discharge: 2023-03-03 | Disposition: A | Discharge status: Home / Self Care | Payer: Medicare Other | Attending: Family Medicine | Admitting: Family Medicine

## 2023-03-03 DIAGNOSIS — I10 Essential (primary) hypertension: Secondary | ICD-10-CM | POA: Diagnosis not present

## 2023-03-03 DIAGNOSIS — J069 Acute upper respiratory infection, unspecified: Secondary | ICD-10-CM

## 2023-03-03 MED ORDER — PROMETHAZINE-DM 6.25-15 MG/5ML PO SYRP: 5.0000 mL | ORAL_SOLUTION | Freq: Every evening | ORAL | 0 refills | Status: DC | PRN

## 2023-03-03 MED ORDER — BENZONATATE 200 MG PO CAPS: ORAL_CAPSULE | ORAL | 0 refills | Status: DC

## 2023-03-03 NOTE — Discharge Instructions (Signed)
Your blood pressure was noted to be elevated during your visit today. If you are currently taking medication for high blood pressure, please ensure you are taking this as directed. If you do not have a history of high blood pressure and your blood pressure remains persistently elevated, you may need to begin taking a medication at some point. You may return here within the next few days to recheck if unable to see your primary care provider or if you do not have a one.  BP (!) 150/103 (BP Location: Right Arm)   Pulse 89   Temp 98.2 F (36.8 C) (Oral)   Resp 18   SpO2 95%   BP Readings from Last 3 Encounters:  03/03/23 (!) 150/103  11/13/22 (!) 140/97  07/06/22 (!) 147/82

## 2023-03-03 NOTE — Telephone Encounter (Signed)
1. Which medications need refilled? (List name and dosage, if known) Dilantin  2. Which pharmacy/location is medication to be sent to? (include street and city if local pharmacy) [place]

## 2023-03-03 NOTE — ED Provider Notes (Signed)
[facility]   [id number] 03/03/23 Arrival Time: 0807  ASSESSMENT & PLAN:  1. Viral URI with cough   2. Elevated blood pressure reading with diagnosis of hypertension    Normal lung exam. Discussed typical duration of likely viral illness. OTC symptom care as needed.  New Prescriptions   BENZONATATE (TESSALON) 200 MG CAPSULE    Take 1 capsule by mouth every 8 (eight) hours for cough.   PROMETHAZINE-DEXTROMETHORPHAN (PROMETHAZINE-DM) 6.25-15 MG/5ML SYRUP    Take 5 mLs by mouth at bedtime as needed for cough.     Discharge Instructions      Your blood pressure was noted to be elevated during your visit today. If you are currently taking medication for high blood pressure, please ensure you are taking this as directed. If you do not have a history of high blood pressure and your blood pressure remains persistently elevated, you may need to begin taking a medication at some point. You may return here within the next few days to recheck if unable to see your primary care provider or if you do not have a one.  BP (!) 150/103 (BP Location: Right Arm)   Pulse 89   Temp 98.2 F (36.8 C) (Oral)   Resp 18   SpO2 95%   BP Readings from Last 3 Encounters:  03/03/23 (!) 150/103  11/13/22 (!) 140/97  07/06/22 (!) 147/82          Follow-up Information      Urgent Care at [facility]).   Specialty: Urgent Care Why: If worsening or failing to improve as anticipated. Contact information: [address] [phone] mc [place] [address] [phone]                Reviewed expectations re: course of current medical issues. Questions answered. Outlined signs and symptoms indicating need for more acute intervention. Understanding verbalized. After Visit Summary given.   SUBJECTIVE: History from: Patient. [name] [name] is a 42 y.o. female. Pt c/o cough, wheeze, sore throat, hoarse voice, nasal congestion  with drainage,   Onset ~ thurs   Not relieved with mucinex or benadryl  Denies: difficulty breathing. Normal PO intake without n/v/d.  Increased blood pressure noted today. Reports that she has been treated for hypertension in the past. Denies CP.  OBJECTIVE:  Vitals:   03/03/23 0817  BP: (!) 150/103  Pulse: 89  Resp: 18  Temp: 98.2 F (36.8 C)  TempSrc: Oral  SpO2: 95%    General appearance: alert; no distress Eyes: PERRLA; EOMI; conjunctiva normal HENT: [place]; AT; with nasal congestion Neck: supple  Lungs: speaks full sentences without difficulty; unlabored; persistent dry cough; CTAB Extremities: no edema Skin: warm and dry Neurologic: normal gait Psychological: alert and cooperative; normal mood and affect  No Known Allergies  Past Medical History:  Diagnosis Date   Anemia    has taken iron supplements in the past   Arthritis    Bipolar affective disorder (HCC)    no meds currently;has been years since taking meds   Carpal tunnel syndrome on both sides    Gestational diabetes    H/O varicella    Hypertension    Pt states she had pre-eclampsia w last pregnancy    Infection    Yeast inf;not frequent   Migraines    would be rx'd Percacet for relief   Pregnancy induced hypertension    Seizures (HCC) 05/20/2011   unsure what started seizures;Neurology appt on 05/30/12;on Kepra 2000mg   bid;was on Topramax 50mg  tid   Wears glasses    Yeast infection    Social History   Socioeconomic History   Marital status: Single    Spouse name: Not on file   Number of children: 1   Years of education: 13   Highest education level: Not on file  Occupational History    Comment: Pt is disabled  Tobacco Use   Smoking status: Never   Smokeless tobacco: Never  Substance and Sexual Activity   Alcohol use: No   Drug use: No   Sexual activity: Yes    Partners: Male    Birth control/protection: None    Comment: undecided on birth control  method  Other Topics Concern    Not on file  Social History Narrative   Not on file   Social Determinants of Health   Financial Resource Strain: Not on file  Food Insecurity: Not on file  Transportation Needs: Not on file  Physical Activity: Not on file  Stress: Not on file  Social Connections: Not on file  Intimate Partner Violence: Not on file   Family History  Problem Relation Age of Onset   Cancer Mother        Ovarian   Diabetes Mother    Hypertension Father    Cirrhosis Father    Hypertension Brother    Asthma Son    Migraines Daughter    Past Surgical History:  Procedure Laterality Date   CESAREAN SECTION     CESAREAN SECTION N/A 12/26/2012   Procedure: Repeat cesarean section with delivery of baby;  Surgeon: Allie Bossier, MD;  Location: [facility];  Service: Obstetrics;  Laterality: N/A;   TONSILLECTOMY     During high school years   WISDOM TOOTH EXTRACTION     All 4 removed     Mardella Layman, MD 03/03/23 [id number]

## 2023-03-03 NOTE — ED Triage Notes (Signed)
Pt c/o cough, wheeze, sore throat, hoarse voice, nasal congestion with drainage,   Onset ~ thurs   Not relieved with mucinex or benadryl

## 2023-03-04 ENCOUNTER — Telehealth: Payer: Medicare Other | Admitting: Physician Assistant

## 2023-03-04 DIAGNOSIS — J069 Acute upper respiratory infection, unspecified: Secondary | ICD-10-CM

## 2023-03-04 DIAGNOSIS — L299 Pruritus, unspecified: Secondary | ICD-10-CM | POA: Diagnosis not present

## 2023-03-04 DIAGNOSIS — B3731 Acute candidiasis of vulva and vagina: Secondary | ICD-10-CM

## 2023-03-04 DIAGNOSIS — L219 Seborrheic dermatitis, unspecified: Secondary | ICD-10-CM | POA: Diagnosis not present

## 2023-03-04 MED ORDER — FLUCONAZOLE 150 MG PO TABS
150.0000 mg | ORAL_TABLET | ORAL | 0 refills | Status: DC | PRN
Start: 2023-03-04 — End: 2023-03-16

## 2023-03-04 MED ORDER — BENZONATATE 100 MG PO CAPS
100.0000 mg | ORAL_CAPSULE | Freq: Three times a day (TID) | ORAL | 0 refills | Status: DC | PRN
Start: 2023-03-04 — End: 2023-05-14

## 2023-03-04 MED ORDER — PROMETHAZINE-DM 6.25-15 MG/5ML PO SYRP
5.0000 mL | ORAL_SOLUTION | Freq: Every evening | ORAL | 0 refills | Status: DC | PRN
Start: 2023-03-04 — End: 2023-11-03

## 2023-03-04 MED ORDER — HYDROXYZINE HCL 10 MG PO TABS
10.0000 mg | ORAL_TABLET | Freq: Three times a day (TID) | ORAL | 0 refills | Status: DC | PRN
Start: 2023-03-04 — End: 2023-03-04

## 2023-03-04 MED ORDER — HYDROXYZINE HCL 10 MG PO TABS
10.0000 mg | ORAL_TABLET | Freq: Three times a day (TID) | ORAL | 0 refills | Status: DC | PRN
Start: 2023-03-04 — End: 2024-04-21

## 2023-03-04 MED ORDER — KETOCONAZOLE 2 % EX SHAM
1.0000 | MEDICATED_SHAMPOO | CUTANEOUS | 0 refills | Status: DC
Start: 2023-03-04 — End: 2023-03-16

## 2023-03-04 MED ORDER — FLUCONAZOLE 150 MG PO TABS
150.0000 mg | ORAL_TABLET | ORAL | 0 refills | Status: DC | PRN
Start: 2023-03-04 — End: 2023-03-04

## 2023-03-04 MED ORDER — KETOCONAZOLE 2 % EX SHAM
1.0000 | MEDICATED_SHAMPOO | CUTANEOUS | 0 refills | Status: DC
Start: 2023-03-04 — End: 2023-03-04

## 2023-03-04 NOTE — Progress Notes (Signed)
Virtual Visit Consent   [name] [name], you are scheduled for a virtual visit with a [facility] provider today. Just as with appointments in the office, your consent must be obtained to participate. Your consent will be active for this visit and any virtual visit you may have with one of our providers in the next 365 days. If you have a MyChart account, a copy of this consent can be sent to you electronically.  As this is a virtual visit, video technology does not allow for your provider to perform a traditional examination. This may limit your provider's ability to fully assess your condition. If your provider identifies any concerns that need to be evaluated in person or the need to arrange testing (such as labs, EKG, etc.), we will make arrangements to do so. Although advances in technology are sophisticated, we cannot ensure that it will always work on either your end or our end. If the connection with a video visit is poor, the visit may have to be switched to a telephone visit. With either a video or telephone visit, we are not always able to ensure that we have a secure connection.  By engaging in this virtual visit, you consent to the provision of healthcare and authorize for your insurance to be billed (if applicable) for the services provided during this visit. Depending on your insurance coverage, you may receive a charge related to this service.  I need to obtain your verbal consent now. Are you willing to proceed with your visit today? Chera [name] has provided verbal consent on 03/04/2023 for a virtual visit (video or telephone). [name] Loveless, PA-C  Date: 03/04/2023 8:46 AM  Virtual Visit via Video Note   I, [name] [name], [name]  ([id number], 01-06-1981) on 03/04/23 at  8:30 AM EDT by a video-enabled telemedicine application and verified that I am speaking with the correct person using two identifiers.  Location: Patient: Virtual Visit Location  Patient: Home Provider: Virtual Visit Location Provider: Home Office   I discussed the limitations of evaluation and management by telemedicine and the availability of in person appointments. The patient expressed understanding and agreed to proceed.    History of Present Illness: [name] [name] is a 42 y.o. who identifies as a female who was assigned female at birth, and is being seen today for a few complaints.  Cough: Seen at UC last night. Medications not covered by insurance. Wants to discuss options  Rash: Diffuse, pruritic, fine papular rash that flares every time she goes outside. Subsides if indoors  Dandruff: Frontal scalp area is worst. New onset. Itches. No sores.  Yeast infection: Ongoing for a week or so. Vaginal itching and irritation. Mild discharge. No odor. No vaginal pain. No vaginal bleeding  Problems:  Patient Active Problem List   Diagnosis Date Noted   Diabetes mellitus (HCC) 07/06/2022   Epilepsy (HCC) 05/27/2012   Bipolar affective disorder (HCC) 05/27/2012    Allergies: No Known Allergies Medications:  Current Outpatient Medications:    benzonatate (TESSALON) 100 MG capsule, Take 1 capsule (100 mg total) by mouth 3 (three) times daily as needed., Disp: 30 capsule, Rfl: 0   Continuous Blood Gluc Sensor (FREESTYLE LIBRE 3 SENSOR) MISC, 1 each by Does not apply route every 14 (fourteen) days. Place 1 sensor on the skin every 14 days. Use to check glucose continuously, Disp: 2 each, Rfl: 11   cyclobenzaprine (FEXMID) 7.5 MG tablet, Take 1 tablet (7.5 mg total) by mouth  3 (three) times daily as needed for muscle spasms., Disp: 15 tablet, Rfl: 0   Erenumab-aooe (AIMOVIG) 140 MG/ML SOAJ, Inject 140 mg into the skin every 30 (thirty) days., Disp: 1.12 mL, Rfl: 11   fluconazole (DIFLUCAN) 150 MG tablet, Take 1 tablet (150 mg total) by mouth every 3 (three) days as needed., Disp: 2 tablet, Rfl: 0   furosemide (LASIX) 20 MG tablet, Take 20 mg by mouth., Disp: , Rfl:     gabapentin (NEURONTIN) 300 MG capsule, Take 1 capsule (300 mg total) by mouth 2 (two) times daily., Disp: 60 capsule, Rfl: 5   hydrOXYzine (ATARAX) 10 MG tablet, Take 1 tablet (10 mg total) by mouth 3 (three) times daily as needed., Disp: 30 tablet, Rfl: 0   ketoconazole (NIZORAL) 2 % shampoo, Apply 1 Application topically 2 (two) times a week. X 2 weeks, Disp: 120 mL, Rfl: 0   lacosamide (VIMPAT) 50 MG TABS tablet, Take 1 tablet (50 mg total) by mouth 2 (two) times daily., Disp: 60 tablet, Rfl: 11   metFORMIN (GLUCOPHAGE) 1000 MG tablet, Take 1,000 mg by mouth 2 (two) times daily with a meal., Disp: , Rfl:    nystatin cream (MYCOSTATIN), Apply to affected area 2 times daily, Disp: 30 g, Rfl: 0   phenytoin (DILANTIN) 100 MG ER capsule, Take 2 capsules (200 mg total) by mouth 2 (two) times daily., Disp: 120 capsule, Rfl: 5   phenytoin (DILANTIN) 50 MG tablet, Chew by mouth., Disp: , Rfl:    promethazine-dextromethorphan (PROMETHAZINE-DM) 6.25-15 MG/5ML syrup, Take 5 mLs by mouth at bedtime as needed for cough., Disp: 118 mL, Rfl: 0   Semaglutide,0.25 or 0.5MG /DOS, (OZEMPIC, 0.25 OR 0.5 MG/DOSE,) 2 MG/1.5ML SOPN, Inject 0.25 mg into the skin once a week. For 4 weeks then increase to 0.5 mg weekly, Disp: 2 mL, Rfl: 0   Semaglutide,0.25 or 0.5MG /DOS, (OZEMPIC, 0.25 OR 0.5 MG/DOSE,) 2 MG/1.5ML SOPN, Inject 0.5 mg into the skin once a week. After completion of 4 weeks of 0.25 mg, Disp: 2 mL, Rfl: 1   tiZANidine (ZANAFLEX) 4 MG tablet, Take 1 tablet (4 mg total) by mouth daily. (Patient not taking: Reported on 11/13/2022), Disp: 30 tablet, Rfl: 5   topiramate (TOPAMAX) 200 MG tablet, Take 1 tablet (200 mg total) by mouth 2 (two) times daily., Disp: 60 tablet, Rfl: 5  Observations/Objective: Patient is well-developed, well-nourished in no acute distress.  Resting comfortably at home.  Head is normocephalic, atraumatic.  No labored breathing.  Speech is clear and coherent with logical content.  Patient is  alert and oriented at baseline.    Assessment and Plan: 1. Itching - hydrOXYzine (ATARAX) 10 MG tablet; Take 1 tablet (10 mg total) by mouth 3 (three) times daily as needed.  Dispense: 30 tablet; Refill: 0  2. Viral URI with cough - benzonatate (TESSALON) 100 MG capsule; Take 1 capsule (100 mg total) by mouth 3 (three) times daily as needed.  Dispense: 30 capsule; Refill: 0 - promethazine-dextromethorphan (PROMETHAZINE-DM) 6.25-15 MG/5ML syrup; Take 5 mLs by mouth at bedtime as needed for cough.  Dispense: 118 mL; Refill: 0  3. Yeast vaginitis - fluconazole (DIFLUCAN) 150 MG tablet; Take 1 tablet (150 mg total) by mouth every 3 (three) days as needed.  Dispense: 2 tablet; Refill: 0  4. Seborrheic dermatitis - ketoconazole (NIZORAL) 2 % shampoo; Apply 1 Application topically 2 (two) times a week. X 2 weeks  Dispense: 120 mL; Refill: 0  - Cough medications sent to [facility] to see  if more cost effective without insurance there - Fluconazole for yeast - Ketoconazole for possible seborrheic dermatitis (not well visualized); Once complete treatment change to [facility] shampoo longer term. - Hydroxyzine for rash and itching, drowsiness precautions discussed - Moisturize well - Avoid scented soaps, lotions, detergents for skin itching and yeast infection - Limit bubble baths - Do not douche - Seek in person evaluation if symptoms worsen or fail to improve  Follow Up Instructions: I discussed the assessment and treatment plan with the patient. The patient was provided an opportunity to ask questions and all were answered. The patient agreed with the plan and demonstrated an understanding of the instructions.  A copy of instructions were sent to the patient via MyChart unless otherwise noted below.    The patient was advised to call back or seek an in-person evaluation if the symptoms worsen or if the condition fails to improve as anticipated.  Time:  I spent 18 minutes with the patient via  telehealth technology discussing the above problems/concerns.    [name] Loveless, PA-C

## 2023-03-04 NOTE — Patient Instructions (Signed)
[name] [name], thank you for joining [name] Loveless, PA-C for today's virtual visit.  While this provider is not your primary care provider (PCP), if your PCP is located in our provider database this encounter information will be shared with them immediately following your visit.   A [facility] MyChart account gives you access to today's visit and all your visits, tests, and labs performed at [facility] " click here if you don't have a [facility] MyChart account or go to [email or web address]  Consent: (Patient) [name] [name] provided verbal consent for this virtual visit at the beginning of the encounter.  Current Medications:  Current Outpatient Medications:    benzonatate (TESSALON) 100 MG capsule, Take 1 capsule (100 mg total) by mouth 3 (three) times daily as needed., Disp: 30 capsule, Rfl: 0   Continuous Blood Gluc Sensor (FREESTYLE LIBRE 3 SENSOR) MISC, 1 each by Does not apply route every 14 (fourteen) days. Place 1 sensor on the skin every 14 days. Use to check glucose continuously, Disp: 2 each, Rfl: 11   cyclobenzaprine (FEXMID) 7.5 MG tablet, Take 1 tablet (7.5 mg total) by mouth 3 (three) times daily as needed for muscle spasms., Disp: 15 tablet, Rfl: 0   Erenumab-aooe (AIMOVIG) 140 MG/ML SOAJ, Inject 140 mg into the skin every 30 (thirty) days., Disp: 1.12 mL, Rfl: 11   fluconazole (DIFLUCAN) 150 MG tablet, Take 1 tablet (150 mg total) by mouth every 3 (three) days as needed., Disp: 2 tablet, Rfl: 0   furosemide (LASIX) 20 MG tablet, Take 20 mg by mouth., Disp: , Rfl:    gabapentin (NEURONTIN) 300 MG capsule, Take 1 capsule (300 mg total) by mouth 2 (two) times daily., Disp: 60 capsule, Rfl: 5   hydrOXYzine (ATARAX) 10 MG tablet, Take 1 tablet (10 mg total) by mouth 3 (three) times daily as needed., Disp: 30 tablet, Rfl: 0   ketoconazole (NIZORAL) 2 % shampoo, Apply 1 Application topically 2 (two) times a week. X 2 weeks, Disp: 120 mL, Rfl: 0   lacosamide  (VIMPAT) 50 MG TABS tablet, Take 1 tablet (50 mg total) by mouth 2 (two) times daily., Disp: 60 tablet, Rfl: 11   metFORMIN (GLUCOPHAGE) 1000 MG tablet, Take 1,000 mg by mouth 2 (two) times daily with a meal., Disp: , Rfl:    nystatin cream (MYCOSTATIN), Apply to affected area 2 times daily, Disp: 30 g, Rfl: 0   phenytoin (DILANTIN) 100 MG ER capsule, Take 2 capsules (200 mg total) by mouth 2 (two) times daily., Disp: 120 capsule, Rfl: 5   phenytoin (DILANTIN) 50 MG tablet, Chew by mouth., Disp: , Rfl:    promethazine-dextromethorphan (PROMETHAZINE-DM) 6.25-15 MG/5ML syrup, Take 5 mLs by mouth at bedtime as needed for cough., Disp: 118 mL, Rfl: 0   Semaglutide,0.25 or 0.5MG /DOS, (OZEMPIC, 0.25 OR 0.5 MG/DOSE,) 2 MG/1.5ML SOPN, Inject 0.25 mg into the skin once a week. For 4 weeks then increase to 0.5 mg weekly, Disp: 2 mL, Rfl: 0   Semaglutide,0.25 or 0.5MG /DOS, (OZEMPIC, 0.25 OR 0.5 MG/DOSE,) 2 MG/1.5ML SOPN, Inject 0.5 mg into the skin once a week. After completion of 4 weeks of 0.25 mg, Disp: 2 mL, Rfl: 1   tiZANidine (ZANAFLEX) 4 MG tablet, Take 1 tablet (4 mg total) by mouth daily. (Patient not taking: Reported on 11/13/2022), Disp: 30 tablet, Rfl: 5   topiramate (TOPAMAX) 200 MG tablet, Take 1 tablet (200 mg total) by mouth 2 (two) times daily., Disp: 60 tablet, Rfl: 5   Medications ordered  in this encounter:  Meds ordered this encounter  Medications   DISCONTD: hydrOXYzine (ATARAX) 10 MG tablet    Sig: Take 1 tablet (10 mg total) by mouth 3 (three) times daily as needed.    Dispense:  30 tablet    Refill:  0    Order Specific Question:   Supervising Provider    Answer:   Merrilee Jansky [[id number]]   benzonatate (TESSALON) 100 MG capsule    Sig: Take 1 capsule (100 mg total) by mouth 3 (three) times daily as needed.    Dispense:  30 capsule    Refill:  0    Order Specific Question:   Supervising Provider    Answer:   Merrilee Jansky [id number]   promethazine-dextromethorphan  (PROMETHAZINE-DM) 6.25-15 MG/5ML syrup    Sig: Take 5 mLs by mouth at bedtime as needed for cough.    Dispense:  118 mL    Refill:  0    Order Specific Question:   Supervising Provider    Answer:   Merrilee Jansky [[id number]]   DISCONTD: fluconazole (DIFLUCAN) 150 MG tablet    Sig: Take 1 tablet (150 mg total) by mouth every 3 (three) days as needed.    Dispense:  2 tablet    Refill:  0    Order Specific Question:   Supervising Provider    Answer:   Merrilee Jansky [[id number]]   DISCONTD: ketoconazole (NIZORAL) 2 % shampoo    Sig: Apply 1 Application topically 2 (two) times a week. X 2 weeks    Dispense:  120 mL    Refill:  0    Order Specific Question:   Supervising Provider    Answer:   Merrilee Jansky [[id number]]   fluconazole (DIFLUCAN) 150 MG tablet    Sig: Take 1 tablet (150 mg total) by mouth every 3 (three) days as needed.    Dispense:  2 tablet    Refill:  0    Order Specific Question:   Supervising Provider    Answer:   Merrilee Jansky [[id number]]   hydrOXYzine (ATARAX) 10 MG tablet    Sig: Take 1 tablet (10 mg total) by mouth 3 (three) times daily as needed.    Dispense:  30 tablet    Refill:  0    Order Specific Question:   Supervising Provider    Answer:   Merrilee Jansky [[id number]]   ketoconazole (NIZORAL) 2 % shampoo    Sig: Apply 1 Application topically 2 (two) times a week. X 2 weeks    Dispense:  120 mL    Refill:  0    Order Specific Question:   Supervising Provider    Answer:   Merrilee Jansky [[id number]]     *If you need refills on other medications prior to your next appointment, please contact your pharmacy*  Follow-Up: Call back or seek an in-person evaluation if the symptoms worsen or if the condition fails to improve as anticipated.  [facility] [phone]  Other Instructions  Vaginal Yeast Infection, Adult  Vaginal yeast infection is a condition that causes vaginal discharge as well as soreness, swelling, and redness  (inflammation) of the vagina. This is a common condition. Some women get this infection frequently. What are the causes? This condition is caused by a change in the normal balance of the yeast (Candida) and normal bacteria that live in the vagina. This change causes an overgrowth of yeast, which causes the inflammation.  What increases the risk? The condition is more likely to develop in women who: Take antibiotic medicines. Have diabetes. Take birth control pills. Are pregnant. Douche often. Have a weak body defense system (immune system). Have been taking steroid medicines for a long time. Frequently wear tight clothing. What are the signs or symptoms? Symptoms of this condition include: White, thick, creamy vaginal discharge. Swelling, itching, redness, and irritation of the vagina. The lips of the vagina (labia) may be affected as well. Pain or a burning feeling while urinating. Pain during sex. How is this diagnosed? This condition is diagnosed based on: Your medical history. A physical exam. A pelvic exam. Your health care provider will examine a sample of your vaginal discharge under a microscope. Your health care provider may send this sample for testing to confirm the diagnosis. How is this treated? This condition is treated with medicine. Medicines may be over-the-counter or prescription. You may be told to use one or more of the following: Medicine that is taken by mouth (orally). Medicine that is applied as a cream (topically). Medicine that is inserted directly into the vagina (suppository). Follow these instructions at home: Take or apply over-the-counter and prescription medicines only as told by your health care provider. Do not use tampons until your health care provider approves. Do not have sex until your infection has cleared. Sex can prolong or worsen your symptoms of infection. Ask your health care provider when it is safe to resume sexual activity. Keep all  follow-up visits. This is important. How is this prevented?  Do not wear tight clothes, such as pantyhose or tight pants. Wear breathable cotton underwear. Do not use douches, perfumed soap, creams, or powders. Wipe from front to back after using the toilet. If you have diabetes, keep your blood sugar levels under control. Ask your health care provider for other ways to prevent yeast infections. Contact a health care provider if: You have a fever. Your symptoms go away and then return. Your symptoms do not get better with treatment. Your symptoms get worse. You have new symptoms. You develop blisters in or around your vagina. You have blood coming from your vagina and it is not your menstrual period. You develop pain in your abdomen. Summary Vaginal yeast infection is a condition that causes discharge as well as soreness, swelling, and redness (inflammation) of the vagina. This condition is treated with medicine. Medicines may be over-the-counter or prescription. Take or apply over-the-counter and prescription medicines only as told by your health care provider. Do not douche. Resume sexual activity or use of tampons as instructed by your health care provider. Contact a health care provider if your symptoms do not get better with treatment or your symptoms go away and then return. This information is not intended to replace advice given to you by your health care provider. Make sure you discuss any questions you have with your health care provider. Document Revised: 12/23/2020 Document Reviewed: 12/23/2020 Elsevier Patient Education  2023 Elsevier Inc.   Rash, Adult A rash is a change in the color of your skin. A rash can also change the way your skin feels. There are many different conditions and factors that can cause a rash. Some rashes may disappear after a few days, but some may last for a few weeks. Common causes of rashes include: Viral infections, such  as: Colds. Measles. Hand, foot, and mouth disease. Bacterial infections, such as: Scarlet fever. Impetigo. Fungal infections, such as Candida. Allergic reactions to food, medicines,  or skin care products. Follow these instructions at home: The goal of treatment is to stop the itching and keep the rash from spreading. Pay attention to any changes in your symptoms. Follow these instructions to help with your condition: Medicine Take or apply over-the-counter and prescription medicines only as told by your health care provider. These may include: Corticosteroid creams to treat red or swollen skin. Anti-itch lotions. Oral allergy medicines (antihistamines). Oral corticosteroids for severe symptoms.  Skin care Apply cool compresses to the affected areas. Do not scratch or rub your skin. Avoid covering the rash. Make sure the rash is exposed to air as much as possible. Managing itching and discomfort Avoid hot showers or baths, which can make itching worse. A cold shower may help. Try taking a bath with: Epsom salts. Follow manufacturer instructions on the packaging. You can get these at your local pharmacy or grocery store. Baking soda. Pour a small amount into the bath as told by your health care provider. Colloidal oatmeal. Follow manufacturer instructions on the packaging. You can get this at your local pharmacy or grocery store. Try applying baking soda paste to your skin. Stir water into baking soda until it reaches a paste-like consistency. Try applying calamine lotion. This is an over-the-counter lotion that helps to relieve itchiness. Keep cool and out of the sun. Sweating and being hot can make itching worse. General instructions  Rest as needed. Drink enough fluid to keep your urine pale yellow. Wear loose-fitting clothing. Avoid scented soaps, detergents, and perfumes. Use gentle soaps, detergents, perfumes, and other cosmetic products. Avoid any substance that causes your  rash. Keep a journal to help track what causes your rash. Write down: What you eat. What cosmetic products you use. What you drink. What you wear. This includes jewelry. Keep all follow-up visits as told by your health care provider. This is important. Contact a health care provider if: You sweat at night. You lose weight. You urinate more than normal. You urinate less than normal, or you notice that your urine is a darker color than usual. You feel weak. You vomit. Your skin or the whites of your eyes look yellow (jaundice). Your skin: Tingles. Is numb. Your rash: Does not go away after several days. Gets worse. You are: Unusually thirsty. More tired than normal. You have: New symptoms. Pain in your abdomen. A fever. Diarrhea. Get help right away if you: Have a fever and your symptoms suddenly get worse. Develop confusion. Have a severe headache or a stiff neck. Have severe joint pains or stiffness. Have a seizure. Develop a rash that covers all or most of your body. The rash may or may not be painful. Develop blisters that: Are on top of the rash. Grow larger or grow together. Are painful. Are inside your nose or mouth. Develop a rash that: Looks like purple pinprick-sized spots all over your body. Has a "bull's eye" or looks like a target. Is not related to sun exposure, is red and painful, and causes your skin to peel. Summary A rash is a change in the color of your skin. Some rashes disappear after a few days, but some may last for a few weeks. The goal of treatment is to stop the itching and keep the rash from spreading. Take or apply over-the-counter and prescription medicines only as told by your health care provider. Contact a health care provider if you have new or worsening symptoms. Keep all follow-up visits as told by your health care provider.  This is important. This information is not intended to replace advice given to you by your health care  provider. Make sure you discuss any questions you have with your health care provider. Document Revised: 04/07/2022 Document Reviewed: 07/17/2021 Elsevier Patient Education  2023 Elsevier Inc.   Seborrheic Dermatitis, Adult Seborrheic dermatitis is a skin disease that causes red, scaly patches. It often occurs on the scalp, where it may be called dandruff. The patches may also appear on other parts of the body. Skin patches tend to occur where there are a lot of oil glands in the skin. Areas of the body that may be affected include: The scalp. The face, eyebrows, and ears. The area around a beard. Skin folds of the body. This includes the armpits, groin, and buttocks. The chest. The condition is often long-lasting (chronic). It may come and go for no known reason. It may be activated by a trigger, such as: Cold weather. Being out in the sun. Stress. Drinking alcohol. What are the causes? The cause of this condition is not known. It may be related to having too much yeast on the skin or changes in how your body's disease-fighting system (immune system) works. What increases the risk? You may be more likely to develop this condition if: You have a weak immune system. You are 34 years old or older. You have other conditions, such as: Human immunodeficiency virus (HIV) or acquired immunodeficiency virus (AIDS). Parkinson's disease. Mood disorders, such as depression. Liver problems. Obesity. What are the signs or symptoms? Symptoms of this condition include: Thick scales on the scalp. Redness on the face or in the armpits. Skin that is flaky. The flakes may be white or yellow. Skin that seems oily or dry but is not helped with moisturizers. Itching or burning in the affected areas. How is this diagnosed? This condition is diagnosed with a medical history and physical exam. A sample of your skin may be tested (skin biopsy). You may need to see a skin specialist (dermatologist). How  is this treated? There is no cure for this condition, but treatment can help to manage the symptoms. You may get treatment to remove scales, lower the risk of skin infection, and reduce swelling or itching. Treatment may include: Medicated shampoos, moisturizing creams, or ointments. Creams that reduce skin yeast. Creams that reduce swelling and irritation (steroids). Follow these instructions at home: Skin care Use any medicated shampoo, skin creams, or ointments only as told by your health care provider. Do not use skin products that contain alcohol. Take lukewarm baths or showers. Avoid very hot water. When you are outside, wear a hat and clothes that block UV light. General instructions Apply over-the-counter and prescription medicines only as told by your health care provider. Learn what triggers your symptoms so you can avoid these things. Use techniques for stress reduction, such as meditation or yoga. Do not drink alcohol if your health care provider tells you not to drink. Keep all follow-up visits. Your health care provider will check your skin to make sure the treatments are helping. Where to find more information American Academy of Dermatology: [employer] Contact a health care provider if: Your symptoms do not get better with treatment. Your symptoms get worse. You have new symptoms. Get help right away if: Your condition quickly gets worse, even with treatment. This information is not intended to replace advice given to you by your health care provider. Make sure you discuss any questions you have with your health care provider.  Document Revised: 03/06/2022 Document Reviewed: 03/06/2022 Elsevier Patient Education  2023 Elsevier Inc.    If you have been instructed to have an in-person evaluation today at a local Urgent Care facility, please use the link below. It will take you to a list of all of our available [facility], including address, phone number and  hours of operation. Please do not delay care.  [facility]  If you or a family member do not have a primary care provider, use the link below to schedule a visit and establish care. When you choose a [facility] primary care physician or advanced practice provider, you gain a long-term partner in health. Find a Primary Care Provider  Learn more about [facility]'s in-office and virtual care options: [facility] - Get Care Now

## 2023-03-05 ENCOUNTER — Other Ambulatory Visit: Payer: Self-pay | Admitting: Neurology

## 2023-03-05 MED ORDER — PHENYTOIN SODIUM EXTENDED 100 MG PO CAPS: 200.0000 mg | ORAL_CAPSULE | Freq: Two times a day (BID) | ORAL | 5 refills | Status: DC

## 2023-03-05 NOTE — Telephone Encounter (Signed)
Refill for Dilantin has been sent to [facility] on [address]

## 2023-03-08 ENCOUNTER — Telehealth: Payer: Medicare Other | Admitting: Physician Assistant

## 2023-03-08 DIAGNOSIS — L304 Erythema intertrigo: Secondary | ICD-10-CM | POA: Diagnosis not present

## 2023-03-08 MED ORDER — NYSTATIN 100000 UNIT/GM EX CREA
1.0000 | TOPICAL_CREAM | Freq: Two times a day (BID) | CUTANEOUS | 0 refills | Status: AC
Start: 2023-03-08 — End: ?

## 2023-03-08 NOTE — Progress Notes (Signed)
Virtual Visit Consent   [name] [name], you are scheduled for a virtual visit with a [place] provider today. Just as with appointments in the office, your consent must be obtained to participate. Your consent will be active for this visit and any virtual visit you may have with one of our providers in the next 365 days. If you have a MyChart account, a copy of this consent can be sent to you electronically.  As this is a virtual visit, video technology does not allow for your provider to perform a traditional examination. This may limit your provider's ability to fully assess your condition. If your provider identifies any concerns that need to be evaluated in person or the need to arrange testing (such as labs, EKG, etc.), we will make arrangements to do so. Although advances in technology are sophisticated, we cannot ensure that it will always work on either your end or our end. If the connection with a video visit is poor, the visit may have to be switched to a telephone visit. With either a video or telephone visit, we are not always able to ensure that we have a secure connection.  By engaging in this virtual visit, you consent to the provision of healthcare and authorize for your insurance to be billed (if applicable) for the services provided during this visit. Depending on your insurance coverage, you may receive a charge related to this service.  I need to obtain your verbal consent now. Are you willing to proceed with your visit today? [name] [name] has provided verbal consent on 03/08/2023 for a virtual visit (video or telephone). [name] Loveless, PA-C  Date: 03/08/2023 3:17 PM  Virtual Visit via Video Note   I, [name] [name], [name]  ([id number], 12-Dec-1980) on 03/08/23 at  3:00 PM EDT by a video-enabled telemedicine application and verified that I am speaking with the correct person using two identifiers.  Location: Patient: Virtual Visit Location  Patient: Home Provider: Virtual Visit Location Provider: Home Office   I discussed the limitations of evaluation and management by telemedicine and the availability of in person appointments. The patient expressed understanding and agreed to proceed.    History of Present Illness: [name] [name] is a 42 y.o. who identifies as a female who was assigned female at birth, and is being seen today for rash under breast and skin folds. Having sweating. Noticing redness and irritation in her groin and under breast tissue.    Problems:  Patient Active Problem List   Diagnosis Date Noted   Diabetes mellitus (HCC) 07/06/2022   Epilepsy (HCC) 05/27/2012   Bipolar affective disorder (HCC) 05/27/2012    Allergies: No Known Allergies Medications:  Current Outpatient Medications:    nystatin cream (MYCOSTATIN), Apply 1 Application topically 2 (two) times daily., Disp: 60 g, Rfl: 0   benzonatate (TESSALON) 100 MG capsule, Take 1 capsule (100 mg total) by mouth 3 (three) times daily as needed., Disp: 30 capsule, Rfl: 0   Continuous Blood Gluc Sensor (FREESTYLE LIBRE 3 SENSOR) MISC, 1 each by Does not apply route every 14 (fourteen) days. Place 1 sensor on the skin every 14 days. Use to check glucose continuously, Disp: 2 each, Rfl: 11   cyclobenzaprine (FEXMID) 7.5 MG tablet, Take 1 tablet (7.5 mg total) by mouth 3 (three) times daily as needed for muscle spasms., Disp: 15 tablet, Rfl: 0   Erenumab-aooe (AIMOVIG) 140 MG/ML SOAJ, Inject 140 mg into the skin every 30 (thirty) days., Disp:  1.12 mL, Rfl: 11   fluconazole (DIFLUCAN) 150 MG tablet, Take 1 tablet (150 mg total) by mouth every 3 (three) days as needed., Disp: 2 tablet, Rfl: 0   furosemide (LASIX) 20 MG tablet, Take 20 mg by mouth., Disp: , Rfl:    gabapentin (NEURONTIN) 300 MG capsule, Take 1 capsule (300 mg total) by mouth 2 (two) times daily., Disp: 60 capsule, Rfl: 5   hydrOXYzine (ATARAX) 10 MG tablet, Take 1 tablet (10 mg total) by mouth 3  (three) times daily as needed., Disp: 30 tablet, Rfl: 0   ketoconazole (NIZORAL) 2 % shampoo, Apply 1 Application topically 2 (two) times a week. X 2 weeks, Disp: 120 mL, Rfl: 0   lacosamide (VIMPAT) 50 MG TABS tablet, Take 1 tablet (50 mg total) by mouth 2 (two) times daily., Disp: 60 tablet, Rfl: 11   metFORMIN (GLUCOPHAGE) 1000 MG tablet, Take 1,000 mg by mouth 2 (two) times daily with a meal., Disp: , Rfl:    phenytoin (DILANTIN) 100 MG ER capsule, Take 2 capsules (200 mg total) by mouth 2 (two) times daily., Disp: 120 capsule, Rfl: 5   phenytoin (DILANTIN) 50 MG tablet, Chew by mouth., Disp: , Rfl:    promethazine-dextromethorphan (PROMETHAZINE-DM) 6.25-15 MG/5ML syrup, Take 5 mLs by mouth at bedtime as needed for cough., Disp: 118 mL, Rfl: 0   Semaglutide,0.25 or 0.5MG /DOS, (OZEMPIC, 0.25 OR 0.5 MG/DOSE,) 2 MG/1.5ML SOPN, Inject 0.25 mg into the skin once a week. For 4 weeks then increase to 0.5 mg weekly, Disp: 2 mL, Rfl: 0   Semaglutide,0.25 or 0.5MG /DOS, (OZEMPIC, 0.25 OR 0.5 MG/DOSE,) 2 MG/1.5ML SOPN, Inject 0.5 mg into the skin once a week. After completion of 4 weeks of 0.25 mg, Disp: 2 mL, Rfl: 1   tiZANidine (ZANAFLEX) 4 MG tablet, Take 1 tablet (4 mg total) by mouth daily. (Patient not taking: Reported on 11/13/2022), Disp: 30 tablet, Rfl: 5   topiramate (TOPAMAX) 200 MG tablet, Take 1 tablet (200 mg total) by mouth 2 (two) times daily., Disp: 60 tablet, Rfl: 5  Observations/Objective: Patient is well-developed, well-nourished in no acute distress.  Resting comfortably at home.  Head is normocephalic, atraumatic.  No labored breathing.  Speech is clear and coherent with logical content.  Patient is alert and oriented at baseline.    Assessment and Plan: 1. Intertrigo - nystatin cream (MYCOSTATIN); Apply 1 Application topically 2 (two) times daily.  Dispense: 60 g; Refill: 0  - Suspect Intertrigo - Nystatin prescribed - Keep skin clean and dry - Discussed Interdry pads -  Seek in person evaluation if worsening or fails to respond  Follow Up Instructions: I discussed the assessment and treatment plan with the patient. The patient was provided an opportunity to ask questions and all were answered. The patient agreed with the plan and demonstrated an understanding of the instructions.  A copy of instructions were sent to the patient via MyChart unless otherwise noted below.    The patient was advised to call back or seek an in-person evaluation if the symptoms worsen or if the condition fails to improve as anticipated.  Time:  I spent 15 minutes with the patient via telehealth technology discussing the above problems/concerns.    [name] Loveless, PA-C

## 2023-03-08 NOTE — Patient Instructions (Signed)
[name], thank you for joining [name], PA-C for today's virtual visit.  While this provider is not your primary care provider (PCP), if your PCP is located in our provider database this encounter information will be shared with them immediately following your visit.   A [facility] MyChart account gives you access to today's visit and all your visits, tests, and labs performed at [facility] " click here if you don't have a [facility] MyChart account or go to [email or web address]  Consent: (Patient) [name] [name] provided verbal consent for this virtual visit at the beginning of the encounter.  Current Medications:  Current Outpatient Medications:    nystatin cream (MYCOSTATIN), Apply 1 Application topically 2 (two) times daily., Disp: 60 g, Rfl: 0   benzonatate (TESSALON) 100 MG capsule, Take 1 capsule (100 mg total) by mouth 3 (three) times daily as needed., Disp: 30 capsule, Rfl: 0   Continuous Blood Gluc Sensor (FREESTYLE LIBRE 3 SENSOR) MISC, 1 each by Does not apply route every 14 (fourteen) days. Place 1 sensor on the skin every 14 days. Use to check glucose continuously, Disp: 2 each, Rfl: 11   cyclobenzaprine (FEXMID) 7.5 MG tablet, Take 1 tablet (7.5 mg total) by mouth 3 (three) times daily as needed for muscle spasms., Disp: 15 tablet, Rfl: 0   Erenumab-aooe (AIMOVIG) 140 MG/ML SOAJ, Inject 140 mg into the skin every 30 (thirty) days., Disp: 1.12 mL, Rfl: 11   fluconazole (DIFLUCAN) 150 MG tablet, Take 1 tablet (150 mg total) by mouth every 3 (three) days as needed., Disp: 2 tablet, Rfl: 0   furosemide (LASIX) 20 MG tablet, Take 20 mg by mouth., Disp: , Rfl:    gabapentin (NEURONTIN) 300 MG capsule, Take 1 capsule (300 mg total) by mouth 2 (two) times daily., Disp: 60 capsule, Rfl: 5   hydrOXYzine (ATARAX) 10 MG tablet, Take 1 tablet (10 mg total) by mouth 3 (three) times daily as needed., Disp: 30 tablet, Rfl: 0   ketoconazole (NIZORAL) 2 %  shampoo, Apply 1 Application topically 2 (two) times a week. X 2 weeks, Disp: 120 mL, Rfl: 0   lacosamide (VIMPAT) 50 MG TABS tablet, Take 1 tablet (50 mg total) by mouth 2 (two) times daily., Disp: 60 tablet, Rfl: 11   metFORMIN (GLUCOPHAGE) 1000 MG tablet, Take 1,000 mg by mouth 2 (two) times daily with a meal., Disp: , Rfl:    phenytoin (DILANTIN) 100 MG ER capsule, Take 2 capsules (200 mg total) by mouth 2 (two) times daily., Disp: 120 capsule, Rfl: 5   phenytoin (DILANTIN) 50 MG tablet, Chew by mouth., Disp: , Rfl:    promethazine-dextromethorphan (PROMETHAZINE-DM) 6.25-15 MG/5ML syrup, Take 5 mLs by mouth at bedtime as needed for cough., Disp: 118 mL, Rfl: 0   Semaglutide,0.25 or 0.5MG /DOS, (OZEMPIC, 0.25 OR 0.5 MG/DOSE,) 2 MG/1.5ML SOPN, Inject 0.25 mg into the skin once a week. For 4 weeks then increase to 0.5 mg weekly, Disp: 2 mL, Rfl: 0   Semaglutide,0.25 or 0.5MG /DOS, (OZEMPIC, 0.25 OR 0.5 MG/DOSE,) 2 MG/1.5ML SOPN, Inject 0.5 mg into the skin once a week. After completion of 4 weeks of 0.25 mg, Disp: 2 mL, Rfl: 1   tiZANidine (ZANAFLEX) 4 MG tablet, Take 1 tablet (4 mg total) by mouth daily. (Patient not taking: Reported on 11/13/2022), Disp: 30 tablet, Rfl: 5   topiramate (TOPAMAX) 200 MG tablet, Take 1 tablet (200 mg total) by mouth 2 (two) times daily., Disp: 60 tablet, Rfl: 5   Medications  ordered in this encounter:  Meds ordered this encounter  Medications   nystatin cream (MYCOSTATIN)    Sig: Apply 1 Application topically 2 (two) times daily.    Dispense:  60 g    Refill:  0    Order Specific Question:   Supervising Provider    Answer:   Merrilee Jansky [id number]     *If you need refills on other medications prior to your next appointment, please contact your pharmacy*  Follow-Up: Call back or seek an in-person evaluation if the symptoms worsen or if the condition fails to improve as anticipated.  [facility] [phone]  Other Instructions Interdry  pads  Intertrigo Intertrigo is skin irritation or inflammation (dermatitis) that occurs when folds of skin rub together. The irritation can cause a rash and make skin raw and itchy. This condition mostly occurs in the skin folds of these areas: Armpits. Under the breasts. Under the abdomen. Groin. Buttocks. Toes. Intertrigo is not passed from person to person (is not contagious). What are the causes? This condition is caused by heat, moisture, rubbing (friction), and not enough air circulation. The condition can be made worse by: Sweat. Bacteria. A fungus, such as yeast. What increases the risk? This condition is more likely to occur if you have moisture in your skin folds. You are more likely to develop this condition if you: Are not able to move around or are not active. Live in a warm and moist climate. Are not able to control your bowels or bladder (have incontinence). Wear splints, braces, or other medical devices. Are overweight. Have diabetes. What are the signs or symptoms? Symptoms of this condition include: A pink or red skin rash in a skin fold or near a skin fold. Raw or scaly skin. Itchiness or burning. Bleeding. Leaking fluid. A bad smell. How is this diagnosed? This condition is diagnosed with a medical history and physical exam. You may also have a skin swab to test for bacteria or fungus. How is this treated? This condition may be treated by: Cleaning and drying your skin. Taking an antibiotic medicine or using an antibiotic skin cream for a bacterial infection. Using an antifungal cream on your skin or taking pills for an infection that was caused by a fungus, such as yeast. Using a steroid ointment to relieve itchiness and irritation. Separating the skin fold with a clean cotton cloth to absorb moisture and allow air to flow into the area. Follow these instructions at home: Keep the affected area clean and dry. Do not scratch your skin. Stay in a cool  environment as much as possible. Use an air conditioner or fan, if available. Apply over-the-counter and prescription medicines only as told by your health care provider. If you were prescribed antibiotics, use them as told by your health care provider. Do not stop using the antibiotic even if you start to feel better. Keep all follow-up visits. Your health care provider may need to check how well your skin is responding to the treatment. How is this prevented? Shower and dry yourself well after activity or exercise. Use a hair dryer on a cool setting to dry between skin folds, especially after you bathe. Do not wear tight clothes. Wear clothes that are loose, absorbent, and made of cotton. Wear a bra that gives good support, if needed. Protect the skin around your groin and buttocks, especially if you have incontinence. Skin protection includes: Following a regular cleaning routine. Using skin protectant creams, powders,  or ointments. Changing protection pads frequently. Maintain a healthy weight. Take care of your feet, especially if you have diabetes. Foot care includes: Wearing shoes that fit well. Keeping your feet dry. Wearing clean, breathable socks. If you have diabetes, keep your blood sugar under control. Contact a health care provider if: Your symptoms do not improve with treatment. Your symptoms get worse or they spread. You notice increased redness and warmth. You have a fever. This information is not intended to replace advice given to you by your health care provider. Make sure you discuss any questions you have with your health care provider. Document Revised: 02/26/2022 Document Reviewed: 02/26/2022 Elsevier Patient Education  2023 Elsevier Inc.    If you have been instructed to have an in-person evaluation today at a local Urgent Care facility, please use the link below. It will take you to a list of all of our available [facility], including address,  phone number and hours of operation. Please do not delay care.  [facility]  If you or a family member do not have a primary care provider, use the link below to schedule a visit and establish care. When you choose a [facility] primary care physician or advanced practice provider, you gain a long-term partner in health. Find a Primary Care Provider  Learn more about [facility]'s in-office and virtual care options:  - Get Care Now

## 2023-03-11 ENCOUNTER — Ambulatory Visit: Payer: Medicare Other | Admitting: Podiatry

## 2023-03-16 ENCOUNTER — Ambulatory Visit: Payer: Medicare Other | Attending: Family Medicine | Admitting: Family Medicine

## 2023-03-16 ENCOUNTER — Telehealth: Payer: Self-pay | Admitting: Family Medicine

## 2023-03-16 ENCOUNTER — Encounter: Payer: Self-pay | Admitting: Family Medicine

## 2023-03-16 VITALS — BP 132/82 | HR 95 | Ht 61.0 in | Wt 368.4 lb

## 2023-03-16 DIAGNOSIS — F319 Bipolar disorder, unspecified: Secondary | ICD-10-CM | POA: Diagnosis not present

## 2023-03-16 DIAGNOSIS — G40909 Epilepsy, unspecified, not intractable, without status epilepticus: Secondary | ICD-10-CM | POA: Insufficient documentation

## 2023-03-16 DIAGNOSIS — Z7984 Long term (current) use of oral hypoglycemic drugs: Secondary | ICD-10-CM | POA: Diagnosis not present

## 2023-03-16 DIAGNOSIS — M170 Bilateral primary osteoarthritis of knee: Secondary | ICD-10-CM | POA: Diagnosis not present

## 2023-03-16 DIAGNOSIS — E119 Type 2 diabetes mellitus without complications: Secondary | ICD-10-CM | POA: Insufficient documentation

## 2023-03-16 DIAGNOSIS — L219 Seborrheic dermatitis, unspecified: Secondary | ICD-10-CM | POA: Insufficient documentation

## 2023-03-16 DIAGNOSIS — G5603 Carpal tunnel syndrome, bilateral upper limbs: Secondary | ICD-10-CM | POA: Insufficient documentation

## 2023-03-16 DIAGNOSIS — Z713 Dietary counseling and surveillance: Secondary | ICD-10-CM | POA: Insufficient documentation

## 2023-03-16 DIAGNOSIS — E1169 Type 2 diabetes mellitus with other specified complication: Secondary | ICD-10-CM

## 2023-03-16 DIAGNOSIS — Z1159 Encounter for screening for other viral diseases: Secondary | ICD-10-CM

## 2023-03-16 DIAGNOSIS — Z7985 Long-term (current) use of injectable non-insulin antidiabetic drugs: Secondary | ICD-10-CM

## 2023-03-16 DIAGNOSIS — B3731 Acute candidiasis of vulva and vagina: Secondary | ICD-10-CM | POA: Insufficient documentation

## 2023-03-16 LAB — POCT GLYCOSYLATED HEMOGLOBIN (HGB A1C): HbA1c, POC (controlled diabetic range): 11.4 % — AB (ref 0.0–7.0)

## 2023-03-16 MED ORDER — FLUCONAZOLE 150 MG PO TABS
150.0000 mg | ORAL_TABLET | ORAL | 1 refills | Status: DC | PRN
Start: 2023-03-16 — End: 2023-06-23

## 2023-03-16 MED ORDER — GLIPIZIDE 10 MG PO TABS
10.0000 mg | ORAL_TABLET | Freq: Two times a day (BID) | ORAL | 1 refills | Status: DC
Start: 2023-03-16 — End: 2023-09-01

## 2023-03-16 MED ORDER — OZEMPIC (0.25 OR 0.5 MG/DOSE) 2 MG/1.5ML ~~LOC~~ SOPN: 0.5000 mg | PEN_INJECTOR | SUBCUTANEOUS | 1 refills | Status: DC

## 2023-03-16 MED ORDER — KETOCONAZOLE 2 % EX SHAM
1.0000 | MEDICATED_SHAMPOO | CUTANEOUS | 0 refills | Status: DC
Start: 2023-03-18 — End: 2023-08-12

## 2023-03-16 NOTE — Patient Instructions (Signed)
Seborrheic Dermatitis, Adult Seborrheic dermatitis is a skin disease that causes red, scaly patches. It often occurs on the scalp, where it may be called dandruff. The patches may also appear on other parts of the body. Skin patches tend to occur where there are a lot of oil glands in the skin. Areas of the body that may be affected include: The scalp. The face, eyebrows, and ears. The area around a beard. Skin folds of the body. This includes the armpits, groin, and buttocks. The chest. The condition is often long-lasting (chronic). It may come and go for no known reason. It may be activated by a trigger, such as: Cold weather. Being out in the sun. Stress. Drinking alcohol. What are the causes? The cause of this condition is not known. It may be related to having too much yeast on the skin or changes in how your body's disease-fighting system (immune system) works. What increases the risk? You may be more likely to develop this condition if: You have a weak immune system. You are 50 years old or older. You have other conditions, such as: Human immunodeficiency virus (HIV) or acquired immunodeficiency virus (AIDS). Parkinson's disease. Mood disorders, such as depression. Liver problems. Obesity. What are the signs or symptoms? Symptoms of this condition include: Thick scales on the scalp. Redness on the face or in the armpits. Skin that is flaky. The flakes may be white or yellow. Skin that seems oily or dry but is not helped with moisturizers. Itching or burning in the affected areas. How is this diagnosed? This condition is diagnosed with a medical history and physical exam. A sample of your skin may be tested (skin biopsy). You may need to see a skin specialist (dermatologist). How is this treated? There is no cure for this condition, but treatment can help to manage the symptoms. You may get treatment to remove scales, lower the risk of skin infection, and reduce swelling or  itching. Treatment may include: Medicated shampoos, moisturizing creams, or ointments. Creams that reduce skin yeast. Creams that reduce swelling and irritation (steroids). Follow these instructions at home: Skin care Use any medicated shampoo, skin creams, or ointments only as told by your health care provider. Do not use skin products that contain alcohol. Take lukewarm baths or showers. Avoid very hot water. When you are outside, wear a hat and clothes that block UV light. General instructions Apply over-the-counter and prescription medicines only as told by your health care provider. Learn what triggers your symptoms so you can avoid these things. Use techniques for stress reduction, such as meditation or yoga. Do not drink alcohol if your health care provider tells you not to drink. Keep all follow-up visits. Your health care provider will check your skin to make sure the treatments are helping. Where to find more information American Academy of Dermatology: aad.org Contact a health care provider if: Your symptoms do not get better with treatment. Your symptoms get worse. You have new symptoms. Get help right away if: Your condition quickly gets worse, even with treatment. This information is not intended to replace advice given to you by your health care provider. Make sure you discuss any questions you have with your health care provider. Document Revised: 03/06/2022 Document Reviewed: 03/06/2022 Elsevier Patient Education  2024 Elsevier Inc.  

## 2023-03-16 NOTE — Progress Notes (Signed)
+   Yeast infection

## 2023-03-16 NOTE — Telephone Encounter (Signed)
I wrote a Prescription for a Freestyle Libre CGM in 06/2022 but she never received this. Can you please follow up on this and rewrite script if something else is covered? Thanks

## 2023-03-16 NOTE — Progress Notes (Signed)
Subjective:  Patient ID: [name] [name], female    DOB: Dec 02, 1980  Age: 42 y.o. MRN: [id number]  CC: Diabetes   HPI [name] [name] is a 42 y.o. year old female with a history of Epilepsy, Migraines, Bipolar disorder, Carpal tunnel syndrome, OA of the knees, type 2 diabetes mellitus A1c 11.4.   Interval History:  Seizures is managed by Neurology with last visit in 10/2022.  She Complains of a yeast infection and is requesting Diflucan pill which was last prescribed 2 weeks ago for same. Symptoms have been on x 1 months.  A1c is 11.4, up from 9.7 previously. She just started Ozempic last week even though it was prescribed 6 months ago.  She stated the pharmacy had run out of it. She stopped Metformin 'because she was told it messes with your kidneys' she has not been taking any medications for her diabetes and has not been checking her sugars.  She Complains of dandruff in her scalp and is wondering if this could be as a result of diabetes as well. Past Medical History:  Diagnosis Date   Anemia    has taken iron supplements in the past   Arthritis    Bipolar affective disorder (HCC)    no meds currently;has been years since taking meds   Carpal tunnel syndrome on both sides    Gestational diabetes    H/O varicella    Hypertension    Pt states she had pre-eclampsia w last pregnancy    Infection    Yeast inf;not frequent   Migraines    would be rx'd Percacet for relief   Pregnancy induced hypertension    Seizures (HCC) 05/20/2011   unsure what started seizures;Neurology appt on 05/30/12;on Kepra 2000mg  bid;was on Topramax 50mg  tid   Wears glasses    Yeast infection     Past Surgical History:  Procedure Laterality Date   CESAREAN SECTION     CESAREAN SECTION N/A 12/26/2012   Procedure: Repeat cesarean section with delivery of baby;  Surgeon: Allie Bossier, MD;  Location: [facility];  Service: Obstetrics;  Laterality: N/A;   TONSILLECTOMY     During high school years   WISDOM  TOOTH EXTRACTION     All 4 removed    Family History  Problem Relation Age of Onset   Cancer Mother        Ovarian   Diabetes Mother    Hypertension Father    Cirrhosis Father    Hypertension Brother    Asthma Son    Migraines Daughter     Social History   Socioeconomic History   Marital status: Single    Spouse name: Not on file   Number of children: 1   Years of education: 13   Highest education level: Not on file  Occupational History    Comment: Pt is disabled  Tobacco Use   Smoking status: Never   Smokeless tobacco: Never  Substance and Sexual Activity   Alcohol use: No   Drug use: No   Sexual activity: Yes    Partners: Male    Birth control/protection: None    Comment: undecided on birth control  method  Other Topics Concern   Not on file  Social History Narrative   Not on file   Social Determinants of Health   Financial Resource Strain: Not on file  Food Insecurity: Not on file  Transportation Needs: Not on file  Physical Activity: Not on file  Stress: Not on file  Social Connections: Not on file    No Known Allergies  Outpatient Medications Prior to Visit  Medication Sig Dispense Refill   benzonatate (TESSALON) 100 MG capsule Take 1 capsule (100 mg total) by mouth 3 (three) times daily as needed. 30 capsule 0   Continuous Blood Gluc Sensor (FREESTYLE LIBRE 3 SENSOR) MISC 1 each by Does not apply route every 14 (fourteen) days. Place 1 sensor on the skin every 14 days. Use to check glucose continuously 2 each 11   cyclobenzaprine (FEXMID) 7.5 MG tablet Take 1 tablet (7.5 mg total) by mouth 3 (three) times daily as needed for muscle spasms. 15 tablet 0   Erenumab-aooe (AIMOVIG) 140 MG/ML SOAJ Inject 140 mg into the skin every 30 (thirty) days. 1.12 mL 11   furosemide (LASIX) 20 MG tablet Take 20 mg by mouth.     gabapentin (NEURONTIN) 300 MG capsule Take 1 capsule (300 mg total) by mouth 2 (two) times daily. 60 capsule 5   hydrOXYzine (ATARAX) 10 MG  tablet Take 1 tablet (10 mg total) by mouth 3 (three) times daily as needed. 30 tablet 0   lacosamide (VIMPAT) 50 MG TABS tablet Take 1 tablet (50 mg total) by mouth 2 (two) times daily. 60 tablet 11   nystatin cream (MYCOSTATIN) Apply 1 Application topically 2 (two) times daily. 60 g 0   phenytoin (DILANTIN) 100 MG ER capsule Take 2 capsules (200 mg total) by mouth 2 (two) times daily. 120 capsule 5   phenytoin (DILANTIN) 50 MG tablet Chew by mouth.     promethazine-dextromethorphan (PROMETHAZINE-DM) 6.25-15 MG/5ML syrup Take 5 mLs by mouth at bedtime as needed for cough. 118 mL 0   Semaglutide,0.25 or 0.5MG /DOS, (OZEMPIC, 0.25 OR 0.5 MG/DOSE,) 2 MG/1.5ML SOPN Inject 0.25 mg into the skin once a week. For 4 weeks then increase to 0.5 mg weekly 2 mL 0   tiZANidine (ZANAFLEX) 4 MG tablet Take 1 tablet (4 mg total) by mouth daily. 30 tablet 5   topiramate (TOPAMAX) 200 MG tablet Take 1 tablet (200 mg total) by mouth 2 (two) times daily. 60 tablet 5   fluconazole (DIFLUCAN) 150 MG tablet Take 1 tablet (150 mg total) by mouth every 3 (three) days as needed. 2 tablet 0   ketoconazole (NIZORAL) 2 % shampoo Apply 1 Application topically 2 (two) times a week. X 2 weeks 120 mL 0   metFORMIN (GLUCOPHAGE) 1000 MG tablet Take 1,000 mg by mouth 2 (two) times daily with a meal.     Semaglutide,0.25 or 0.5MG /DOS, (OZEMPIC, 0.25 OR 0.5 MG/DOSE,) 2 MG/1.5ML SOPN Inject 0.5 mg into the skin once a week. After completion of 4 weeks of 0.25 mg 2 mL 1   No facility-administered medications prior to visit.     ROS Review of Systems  Constitutional:  Negative for activity change and appetite change.  HENT:  Negative for sinus pressure and sore throat.   Respiratory:  Negative for chest tightness, shortness of breath and wheezing.   Cardiovascular:  Negative for chest pain and palpitations.  Gastrointestinal:  Negative for abdominal distention, abdominal pain and constipation.  Genitourinary: Negative.    Musculoskeletal: Negative.   Psychiatric/Behavioral:  Negative for behavioral problems and dysphoric mood.     Objective:  BP 132/82   Pulse 95   Ht 5\' 1"  (1.549 m)   Wt (!) 368 lb 6.4 oz (167.1 kg)   SpO2 99%   BMI 69.61 kg/m      03/16/2023    3:48  PM 03/03/2023    8:17 AM 11/13/2022   10:49 AM  BP/Weight  Systolic BP 132 150 140  Diastolic BP 82 103 97  Wt. (Lbs) 368.4    BMI 69.61 kg/m2        Physical Exam Constitutional:      Appearance: She is well-developed. She is obese.  Cardiovascular:     Rate and Rhythm: Normal rate.     Heart sounds: Normal heart sounds. No murmur heard. Pulmonary:     Effort: Pulmonary effort is normal.     Breath sounds: Normal breath sounds. No wheezing or rales.  Chest:     Chest wall: No tenderness.  Abdominal:     General: Bowel sounds are normal. There is no distension.     Palpations: Abdomen is soft. There is no mass.     Tenderness: There is no abdominal tenderness.  Musculoskeletal:        General: Normal range of motion.     Right lower leg: No edema.     Left lower leg: No edema.  Neurological:     Mental Status: She is alert and oriented to person, place, and time.  Psychiatric:        Mood and Affect: Mood normal.        Latest Ref Rng & Units 11/13/2022   10:52 AM 06/16/2022    3:38 PM 03/11/2022   11:22 AM  CMP  Glucose 70 - 99 mg/dL 295  621  308   BUN 6 - 23 mg/dL 11  10  12    Creatinine 0.40 - 1.20 mg/dL 6.57  8.46  9.62   Sodium 135 - 145 mEq/L 138  137  140   Potassium 3.5 - 5.1 mEq/L 3.9  4.2  3.7   Chloride 96 - 112 mEq/L 107  102  113   CO2 19 - 32 mEq/L 23  25  21    Calcium 8.4 - 10.5 mg/dL 8.5  9.2  8.8   Total Protein 6.0 - 8.3 g/dL 7.1  7.7  6.3   Total Bilirubin 0.2 - 1.2 mg/dL 0.2  0.3  0.2   Alkaline Phos 39 - 117 U/L 80  79  63   AST 0 - 37 U/L 23  24  78   ALT 0 - 35 U/L 20  18  43     Lipid Panel  No results found for: "CHOL", "TRIG", "HDL", "CHOLHDL", "VLDL", "LDLCALC",  "LDLDIRECT"  CBC    Component Value Date/Time   WBC 5.6 11/13/2022 1052   RBC 5.07 11/13/2022 1052   HGB 12.9 11/13/2022 1052   HCT 40.9 11/13/2022 1052   PLT 339.0 11/13/2022 1052   MCV 80.7 11/13/2022 1052   MCH 23.1 (L) 03/11/2022 1122   MCHC 31.4 11/13/2022 1052   RDW 16.0 (H) 11/13/2022 1052   LYMPHSABS 1.4 03/11/2022 1122   MONOABS 0.6 03/11/2022 1122   EOSABS 0.1 03/11/2022 1122   BASOSABS 0.1 03/11/2022 1122    Lab Results  Component Value Date   HGBA1C 11.4 (A) 03/16/2023    Assessment & Plan:  1. Type 2 diabetes mellitus with other specified complication, without long-term current use of insulin (HCC) Uncontrolled due to the fact that she has been without any medication Due to concern about metformin I will discontinue metformin but advised her that it is actually uncontrolled diabetes that affects the kidneys rather than metformin Substitute metformin with glipizide Will restart Ozempic again and I will see her back to  review her blood sugar log and titrate up Ozempic I previously wrote a prescription for CGM for her 6 months ago which she never received.  Will obtain prior authorization for this. Counseled on Diabetic diet, my plate method, 865 minutes of moderate intensity exercise/week Blood sugar logs with fasting goals of 80-120 mg/dl, random of less than 784 and in the event of sugars less than 60 mg/dl or greater than 696 mg/dl encouraged to notify the clinic. Advised on the need for annual eye exams, annual foot exams, Pneumonia vaccine.  - POCT glycosylated hemoglobin (Hb A1C) - Microalbumin/Creatinine Ratio, Urine - CMP14+EGFR - glipiZIDE (GLUCOTROL) 10 MG tablet; Take 1 tablet (10 mg total) by mouth 2 (two) times daily before a meal.  Dispense: 180 tablet; Refill: 1 - Semaglutide,0.25 or 0.5MG /DOS, (OZEMPIC, 0.25 OR 0.5 MG/DOSE,) 2 MG/1.5ML SOPN; Inject 0.5 mg into the skin once a week. After completion of 4 weeks of 0.25 mg  Dispense: 2 mL; Refill:  1  2. Yeast vaginitis Persistent hyperglycemic state contributing to A-fib - fluconazole (DIFLUCAN) 150 MG tablet; Take 1 tablet (150 mg total) by mouth every 3 (three) days as needed. For 2 doses  Dispense: 2 tablet; Refill: 1  3. Seborrheic dermatitis - ketoconazole (NIZORAL) 2 % shampoo; Apply 1 Application topically 2 (two) times a week. X 2 weeks  Dispense: 120 mL; Refill: 0  4. Need for hepatitis C screening test - HCV Ab w Reflex to Quant PCR    Meds ordered this encounter  Medications   glipiZIDE (GLUCOTROL) 10 MG tablet    Sig: Take 1 tablet (10 mg total) by mouth 2 (two) times daily before a meal.    Dispense:  180 tablet    Refill:  1    Discontinue Metformin   fluconazole (DIFLUCAN) 150 MG tablet    Sig: Take 1 tablet (150 mg total) by mouth every 3 (three) days as needed. For 2 doses    Dispense:  2 tablet    Refill:  1   ketoconazole (NIZORAL) 2 % shampoo    Sig: Apply 1 Application topically 2 (two) times a week. X 2 weeks    Dispense:  120 mL    Refill:  0   Semaglutide,0.25 or 0.5MG /DOS, (OZEMPIC, 0.25 OR 0.5 MG/DOSE,) 2 MG/1.5ML SOPN    Sig: Inject 0.5 mg into the skin once a week. After completion of 4 weeks of 0.25 mg    Dispense:  2 mL    Refill:  1    Follow-up: Return in about 1 month (around 04/16/2023) for Diabetes follow-up.       Hoy Register, MD, FAAFP. [facility] [place], [place] [phone]   03/16/2023, 6:43 PM

## 2023-03-17 ENCOUNTER — Other Ambulatory Visit: Payer: Self-pay

## 2023-03-17 LAB — CMP14+EGFR
ALT: 24 IU/L (ref 0–32)
AST: 33 IU/L (ref 0–40)
Albumin/Globulin Ratio: 1.2 (ref 1.2–2.2)
Albumin: 3.7 g/dL — ABNORMAL LOW (ref 3.9–4.9)
Alkaline Phosphatase: 102 IU/L (ref 44–121)
BUN/Creatinine Ratio: 14 (ref 9–23)
BUN: 10 mg/dL (ref 6–24)
Bilirubin Total: <0.2 mg/dL (ref 0.0–1.2)
CO2: 19 mmol/L — ABNORMAL LOW (ref 20–29)
Calcium: 9 mg/dL (ref 8.7–10.2)
Chloride: 103 mmol/L (ref 96–106)
Creatinine, Ser: 0.73 mg/dL (ref 0.57–1.00)
Globulin, Total: 3 g/dL (ref 1.5–4.5)
Glucose: 294 mg/dL — ABNORMAL HIGH (ref 70–99)
Potassium: 4.1 mmol/L (ref 3.5–5.2)
Sodium: 137 mmol/L (ref 134–144)
Total Protein: 6.7 g/dL (ref 6.0–8.5)
eGFR: 107 mL/min/{1.73_m2} (ref >59)

## 2023-03-17 LAB — MICROALBUMIN / CREATININE URINE RATIO
Creatinine, Urine: 98.3 mg/dL
Microalb/Creat Ratio: 82 mg/g creat — ABNORMAL HIGH (ref 0–29)
Microalbumin, Urine: 80.8 ug/mL

## 2023-03-17 LAB — HCV AB W REFLEX TO QUANT PCR: HCV Ab: NONREACTIVE

## 2023-03-17 LAB — HCV INTERPRETATION

## 2023-03-19 ENCOUNTER — Ambulatory Visit: Payer: Medicare Other | Admitting: Neurology

## 2023-04-14 ENCOUNTER — Encounter: Payer: Self-pay | Admitting: Neurology

## 2023-04-14 ENCOUNTER — Telehealth: Payer: Self-pay | Admitting: Neurology

## 2023-04-14 NOTE — Telephone Encounter (Signed)
Pt is calling in wanting a letter stating that she has epilepsy and when she was DX with it  (what the side effects that it can bring on ex: loss of memory) and what medication she is on so that she can present it to the [facility].  Pt would like to have this by the end of this week or the beginning of next week. It is due by 04/21/2023.

## 2023-04-14 NOTE — Telephone Encounter (Signed)
Patient advised.

## 2023-04-15 NOTE — Telephone Encounter (Signed)
Pt is calling in to see if the letter can be put in her mychart due to her not being able to see it and to come and pick it up or can it be emailed to her at the email on file.

## 2023-04-15 NOTE — Telephone Encounter (Signed)
Letter sent to my chart

## 2023-04-17 LAB — AMB RESULTS CONSOLE CBG: Glucose: 113

## 2023-04-17 NOTE — Progress Notes (Signed)
Pt didn't list PCP and has a transportation SDOH insecurity.

## 2023-05-03 ENCOUNTER — Encounter: Payer: Self-pay | Admitting: Nurse Practitioner

## 2023-05-03 ENCOUNTER — Telehealth: Payer: Medicare Other | Admitting: Nurse Practitioner

## 2023-05-03 DIAGNOSIS — K5900 Constipation, unspecified: Secondary | ICD-10-CM

## 2023-05-03 NOTE — Progress Notes (Signed)
Virtual Visit Consent   [name] [name], you are scheduled for a virtual visit with a [facility] provider today. Just as with appointments in the office, your consent must be obtained to participate. Your consent will be active for this visit and any virtual visit you may have with one of our providers in the next 365 days. If you have a MyChart account, a copy of this consent can be sent to you electronically.  As this is a virtual visit, video technology does not allow for your provider to perform a traditional examination. This may limit your provider's ability to fully assess your condition. If your provider identifies any concerns that need to be evaluated in person or the need to arrange testing (such as labs, EKG, etc.), we will make arrangements to do so. Although advances in technology are sophisticated, we cannot ensure that it will always work on either your end or our end. If the connection with a video visit is poor, the visit may have to be switched to a telephone visit. With either a video or telephone visit, we are not always able to ensure that we have a secure connection.  By engaging in this virtual visit, you consent to the provision of healthcare and authorize for your insurance to be billed (if applicable) for the services provided during this visit. Depending on your insurance coverage, you may receive a charge related to this service.  I need to obtain your verbal consent now. Are you willing to proceed with your visit today? [name] [name] has provided verbal consent on 05/03/2023 for a virtual visit (video or telephone). [name], FNP  Date: 05/03/2023 3:57 PM  Virtual Visit via Video Note   I, [name], [name]  ([id number], [birth year]) on 05/03/23 at  4:00 PM EDT by a video-enabled telemedicine application and verified that I am speaking with the correct person using two identifiers.  Location: Patient: Virtual Visit Location Patient:  Home Provider: Virtual Visit Location Provider: Home Office   I discussed the limitations of evaluation and management by telemedicine and the availability of in person appointments. The patient expressed understanding and agreed to proceed.    History of Present Illness: [name] [name] is a 42 y.o. who identifies as a female who was assigned female at birth, and is being seen today for feelings of bloating  She has noticed this for the past few months and it has worsened in the past weeks  She has a "bad" feeling in her stomach that she related to a feeling of tightness   She does take Ozempic, and has been on that for the past 2 months, believes these symptoms started prior to initiating Ozempic  She has not lost any weight in the past 2 months  She feels some nausea that has started with the use of Ozempic  She did have an initial bought of diarrhea 2 months ago but now  she has been struggling with being constipated   She does have a BM daily  She denies straining Today she took Magnesium Sulfate for relief    Denies a history of food sensitivities or food allergies  She will feel more bloated after drinking soda  She does not drink diet soda  She admits she does not drink water well   Problems:  Patient Active Problem List   Diagnosis Date Noted   Diabetes mellitus (HCC) 07/06/2022   Epilepsy (HCC) 05/27/2012   Bipolar affective disorder (HCC) 05/27/2012  Allergies: No Known Allergies Medications:  Current Outpatient Medications:    benzonatate (TESSALON) 100 MG capsule, Take 1 capsule (100 mg total) by mouth 3 (three) times daily as needed., Disp: 30 capsule, Rfl: 0   Continuous Blood Gluc Sensor (FREESTYLE LIBRE 3 SENSOR) MISC, 1 each by Does not apply route every 14 (fourteen) days. Place 1 sensor on the skin every 14 days. Use to check glucose continuously, Disp: 2 each, Rfl: 11   cyclobenzaprine (FEXMID) 7.5 MG tablet, Take 1 tablet (7.5 mg total) by mouth 3 (three)  times daily as needed for muscle spasms., Disp: 15 tablet, Rfl: 0   Erenumab-aooe (AIMOVIG) 140 MG/ML SOAJ, Inject 140 mg into the skin every 30 (thirty) days., Disp: 1.12 mL, Rfl: 11   fluconazole (DIFLUCAN) 150 MG tablet, Take 1 tablet (150 mg total) by mouth every 3 (three) days as needed. For 2 doses, Disp: 2 tablet, Rfl: 1   furosemide (LASIX) 20 MG tablet, Take 20 mg by mouth., Disp: , Rfl:    gabapentin (NEURONTIN) 300 MG capsule, Take 1 capsule (300 mg total) by mouth 2 (two) times daily., Disp: 60 capsule, Rfl: 5   glipiZIDE (GLUCOTROL) 10 MG tablet, Take 1 tablet (10 mg total) by mouth 2 (two) times daily before a meal., Disp: 180 tablet, Rfl: 1   hydrOXYzine (ATARAX) 10 MG tablet, Take 1 tablet (10 mg total) by mouth 3 (three) times daily as needed., Disp: 30 tablet, Rfl: 0   ketoconazole (NIZORAL) 2 % shampoo, Apply 1 Application topically 2 (two) times a week. X 2 weeks, Disp: 120 mL, Rfl: 0   lacosamide (VIMPAT) 50 MG TABS tablet, Take 1 tablet (50 mg total) by mouth 2 (two) times daily., Disp: 60 tablet, Rfl: 11   nystatin cream (MYCOSTATIN), Apply 1 Application topically 2 (two) times daily., Disp: 60 g, Rfl: 0   phenytoin (DILANTIN) 100 MG ER capsule, Take 2 capsules (200 mg total) by mouth 2 (two) times daily., Disp: 120 capsule, Rfl: 5   phenytoin (DILANTIN) 50 MG tablet, Chew by mouth., Disp: , Rfl:    promethazine-dextromethorphan (PROMETHAZINE-DM) 6.25-15 MG/5ML syrup, Take 5 mLs by mouth at bedtime as needed for cough., Disp: 118 mL, Rfl: 0   Semaglutide,0.25 or 0.5MG /DOS, (OZEMPIC, 0.25 OR 0.5 MG/DOSE,) 2 MG/1.5ML SOPN, Inject 0.25 mg into the skin once a week. For 4 weeks then increase to 0.5 mg weekly, Disp: 2 mL, Rfl: 0   Semaglutide,0.25 or 0.5MG /DOS, (OZEMPIC, 0.25 OR 0.5 MG/DOSE,) 2 MG/1.5ML SOPN, Inject 0.5 mg into the skin once a week. After completion of 4 weeks of 0.25 mg, Disp: 2 mL, Rfl: 1   tiZANidine (ZANAFLEX) 4 MG tablet, Take 1 tablet (4 mg total) by mouth  daily., Disp: 30 tablet, Rfl: 5   topiramate (TOPAMAX) 200 MG tablet, Take 1 tablet (200 mg total) by mouth 2 (two) times daily., Disp: 60 tablet, Rfl: 5  Observations/Objective: Patient is well-developed, well-nourished in no acute distress.  Resting comfortably  at home.  Head is normocephalic, atraumatic.  No labored breathing.  Speech is clear and coherent with logical content.  Patient is alert and oriented at baseline.    Assessment and Plan:  1. Constipation, unspecified constipation type  Start with Miralax three caps a day mix in Gatorade  Increase water to 8 glasses a day as goal  Limit caffeine and soda   Education on constipation sent to patient  Advised follow up with PCP if symptoms persist  Or in person UC for  acutely worsening symptoms     Follow Up Instructions: I discussed the assessment and treatment plan with the patient. The patient was provided an opportunity to ask questions and all were answered. The patient agreed with the plan and demonstrated an understanding of the instructions.  A copy of instructions were sent to the patient via MyChart unless otherwise noted below.    The patient was advised to call back or seek an in-person evaluation if the symptoms worsen or if the condition fails to improve as anticipated.  Time:  I spent 15 minutes with the patient via telehealth technology discussing the above problems/concerns.    [name], FNP

## 2023-05-11 ENCOUNTER — Encounter: Payer: Self-pay | Admitting: *Deleted

## 2023-05-11 NOTE — Progress Notes (Signed)
Pt attended 04/17/2023 screening event where her b/p was 128/95 and her blood sugar was 113. At the event, the pt identified Dr. Alvis Lemmings at the [facility] as her PCP. Pt also identified transportation as an SDOH insecurity and was given resources at the event. Chart review indicates pt last office visit with Dr. Alvis Lemmings was on 03/16/23 where her b/p was 132/82. Pt also had a [facility] telehealth visit on 05/03/23 and an AWV is scheduled at Dr. Baxter Flattery office on 05/12/23. Pt's event b/p and blood sugar results were also shared with Dr. Alvis Lemmings via [facility] in-basket. No additional health equity team support indicated at this time.

## 2023-05-12 ENCOUNTER — Ambulatory Visit: Payer: Medicare Other | Attending: Family Medicine

## 2023-05-12 VITALS — Ht 61.0 in | Wt 368.0 lb

## 2023-05-12 DIAGNOSIS — Z0000 Encounter for general adult medical examination without abnormal findings: Secondary | ICD-10-CM

## 2023-05-12 DIAGNOSIS — Z1231 Encounter for screening mammogram for malignant neoplasm of breast: Secondary | ICD-10-CM

## 2023-05-12 NOTE — Progress Notes (Signed)
Subjective:   [name] [name] is a 42 y.o. female who presents for an Initial Medicare Annual Wellness Visit.  Visit Complete: Virtual  I [name] on 05/12/23 by a audio enabled telemedicine application and verified that I am speaking with the correct person using two identifiers.  Patient Location: Home  Provider Location: Home Office  I discussed the limitations of evaluation and management by telemedicine. The patient expressed understanding and agreed to proceed.  Patient Medicare AWV questionnaire was completed by the patient on 05/11/23; I have confirmed that all information answered by patient is correct and no changes since this date.  Per patient no change in vitals since last visit; unable to obtain new vitals due to this being a telehealth visit. Patient was unable to self-report vital signs via telehealth due to a lack of equipment at home.  Review of Systems     Cardiac Risk Factors include: hypertension;obesity (BMI >30kg/m2);diabetes mellitus     Objective:    Today's Vitals   05/12/23 1944  Weight: (!) 368 lb (166.9 kg)  Height: 5\' 1"  (1.549 m)   Body mass index is 69.53 kg/m.     05/12/2023    8:50 PM 06/16/2022    2:48 PM 03/11/2022    9:56 AM 10/21/2021    9:26 AM 10/19/2021   11:15 AM 08/02/2021    1:33 PM 12/27/2012   12:06 AM  Advanced Directives  Does Patient Have a Medical Advance Directive? No No No No No No Patient does not have advance directive;Patient would like information  Would patient like information on creating a medical advance directive? Yes (MAU/Ambulatory/Procedural Areas - Information given)  No - Patient declined  No - Patient declined No - Patient declined Other (Comment)  Pre-existing out of facility DNR order (yellow form or pink MOST form)       No    Current Medications (verified) Outpatient Encounter Medications as of 05/12/2023  Medication Sig   Continuous Blood Gluc Sensor (FREESTYLE LIBRE 3 SENSOR) MISC 1  each by Does not apply route every 14 (fourteen) days. Place 1 sensor on the skin every 14 days. Use to check glucose continuously   cyclobenzaprine (FEXMID) 7.5 MG tablet Take 1 tablet (7.5 mg total) by mouth 3 (three) times daily as needed for muscle spasms.   Erenumab-aooe (AIMOVIG) 140 MG/ML SOAJ Inject 140 mg into the skin every 30 (thirty) days.   furosemide (LASIX) 20 MG tablet Take 20 mg by mouth.   gabapentin (NEURONTIN) 300 MG capsule Take 1 capsule (300 mg total) by mouth 2 (two) times daily.   glipiZIDE (GLUCOTROL) 10 MG tablet Take 1 tablet (10 mg total) by mouth 2 (two) times daily before a meal.   hydrOXYzine (ATARAX) 10 MG tablet Take 1 tablet (10 mg total) by mouth 3 (three) times daily as needed.   ketoconazole (NIZORAL) 2 % shampoo Apply 1 Application topically 2 (two) times a week. X 2 weeks   lacosamide (VIMPAT) 50 MG TABS tablet Take 1 tablet (50 mg total) by mouth 2 (two) times daily.   nystatin cream (MYCOSTATIN) Apply 1 Application topically 2 (two) times daily.   phenytoin (DILANTIN) 100 MG ER capsule Take 2 capsules (200 mg total) by mouth 2 (two) times daily.   phenytoin (DILANTIN) 50 MG tablet Chew by mouth.   promethazine-dextromethorphan (PROMETHAZINE-DM) 6.25-15 MG/5ML syrup Take 5 mLs by mouth at bedtime as needed for cough.   Semaglutide,0.25 or 0.5MG /DOS, (OZEMPIC, 0.25 OR 0.5 MG/DOSE,) 2  MG/1.5ML SOPN Inject 0.25 mg into the skin once a week. For 4 weeks then increase to 0.5 mg weekly   Semaglutide,0.25 or 0.5MG /DOS, (OZEMPIC, 0.25 OR 0.5 MG/DOSE,) 2 MG/1.5ML SOPN Inject 0.5 mg into the skin once a week. After completion of 4 weeks of 0.25 mg   tiZANidine (ZANAFLEX) 4 MG tablet Take 1 tablet (4 mg total) by mouth daily.   topiramate (TOPAMAX) 200 MG tablet Take 1 tablet (200 mg total) by mouth 2 (two) times daily.   benzonatate (TESSALON) 100 MG capsule Take 1 capsule (100 mg total) by mouth 3 (three) times daily as needed. (Patient not taking: Reported on  05/12/2023)   fluconazole (DIFLUCAN) 150 MG tablet Take 1 tablet (150 mg total) by mouth every 3 (three) days as needed. For 2 doses (Patient not taking: Reported on 05/12/2023)   No facility-administered encounter medications on file as of 05/12/2023.    Allergies (verified) Patient has no known allergies.   History: Past Medical History:  Diagnosis Date   Allergy    Anemia    has taken iron supplements in the past   Arthritis    Bipolar affective disorder (HCC)    no meds currently;has been years since taking meds   Carpal tunnel syndrome on both sides    Gestational diabetes    H/O varicella    Hypertension    Pt states she had pre-eclampsia w last pregnancy    Infection    Yeast inf;not frequent   Migraines    would be rx'd Percacet for relief   Pregnancy induced hypertension    Seizures (HCC) 05/20/2011   unsure what started seizures;Neurology appt on 05/30/12;on Kepra 2000mg  bid;was on Topramax 50mg  tid   Wears glasses    Yeast infection    Past Surgical History:  Procedure Laterality Date   CESAREAN SECTION     CESAREAN SECTION N/A 12/26/2012   Procedure: Repeat cesarean section with delivery of baby;  Surgeon: Allie Bossier, MD;  Location: [facility];  Service: Obstetrics;  Laterality: N/A;   TONSILLECTOMY     During high school years   WISDOM TOOTH EXTRACTION     All 4 removed   Family History  Problem Relation Age of Onset   Cancer Mother        Ovarian   Diabetes Mother    Hypertension Father    Cirrhosis Father    Arthritis Father    Obesity Father    Hypertension Brother    Obesity Brother    Asthma Son    Migraines Daughter    Anxiety disorder Daughter    Depression Daughter    Social History   Socioeconomic History   Marital status: Single    Spouse name: Not on file   Number of children: 1   Years of education: 13   Highest education level: Not on file  Occupational History    Comment: Pt is disabled  Tobacco Use   Smoking status: Never    Smokeless tobacco: Never  Substance and Sexual Activity   Alcohol use: No   Drug use: No   Sexual activity: Not Currently    Partners: Male    Birth control/protection: Abstinence, None    Comment: undecided on birth control  method  Other Topics Concern   Not on file  Social History Narrative   Not on file   Social Determinants of Health   Financial Resource Strain: Medium Risk (05/11/2023)   Overall Financial Resource Strain (CARDIA)  Difficulty of Paying Living Expenses: Somewhat hard  Food Insecurity: No Food Insecurity (05/11/2023)   Hunger Vital Sign    Worried About Running Out of Food in the Last Year: Never true    Ran Out of Food in the Last Year: Never true  Transportation Needs: Unmet Transportation Needs (05/11/2023)   PRAPARE - Transportation    Lack of Transportation (Medical): Yes    Lack of Transportation (Non-Medical): Yes  Physical Activity: Insufficiently Active (05/12/2023)   Exercise Vital Sign    Days of Exercise per Week: 3 days    Minutes of Exercise per Session: 30 min  Stress: No Stress Concern Present (05/11/2023)   [employer] of Occupational Health - Occupational Stress Questionnaire    Feeling of Stress : Only a little  Social Connections: Moderately Isolated (05/11/2023)   Social Connection and Isolation Panel [NHANES]    Frequency of Communication with Friends and Family: More than three times a week    Frequency of Social Gatherings with Friends and Family: Never    Attends Religious Services: 1 to 4 times per year    Active Member of [employer] or Organizations: No    Attends Engineer, structural: Never    Marital Status: Never married    Tobacco Counseling Counseling given: Not Answered   Clinical Intake:  Pre-visit preparation completed: Yes  Pain : No/denies pain     Diabetes: Yes CBG done?: No Did pt. bring in CBG monitor from home?: No  How often do you need to have someone help you when you read instructions,  pamphlets, or other written materials from your doctor or pharmacy?: 1 - Never  Interpreter Needed?: No  Information entered by :: Kandis Fantasia LPN   Activities of Daily Living    05/11/2023    4:12 PM  In your present state of health, do you have any difficulty performing the following activities:  Hearing? 0  Vision? 0  Difficulty concentrating or making decisions? 0  Walking or climbing stairs? 1  Dressing or bathing? 0  Doing errands, shopping? 1  Preparing Food and eating ? N  Using the Toilet? N  In the past six months, have you accidently leaked urine? N  Do you have problems with loss of bowel control? N  Managing your Medications? N  Managing your Finances? N  Housekeeping or managing your Housekeeping? N    Patient Care Team: Hoy Register, MD as PCP - General (Family Medicine)  Indicate any recent Medical Services you may have received from other than [facility] providers in the past year (date may be approximate).     Assessment:   This is a routine wellness examination for [name] [name].  Hearing/Vision screen Hearing Screening - Comments:: Denies hearing difficulties   Vision Screening - Comments:: No vision problems; will schedule routine eye exam soon    Dietary issues and exercise activities discussed:     Goals Addressed             This Visit's Progress    Increase physical activity        Depression Screen    05/12/2023    8:48 PM 03/16/2023    3:50 PM 07/06/2022    2:22 PM  PHQ 2/9 Scores  PHQ - 2 Score 0 0 2  PHQ- 9 Score 0 0 4    Fall Risk    05/12/2023    8:50 PM 05/11/2023    4:12 PM 03/16/2023    3:50 PM 07/06/2022  2:16 PM 06/16/2022    2:48 PM  Fall Risk   Falls in the past year? 0 0 0 0 1  Number falls in past yr: 0  0 0 1  Injury with Fall? 0  0 0 0  Risk for fall due to : No Fall Risks  No Fall Risks No Fall Risks   Follow up Falls prevention discussed;Education provided;Falls evaluation completed        MEDICARE RISK AT  HOME:  Medicare Risk at Home - 05/11/23 1612     If so, are there any without handrails? No             TIMED UP AND GO:  Was the test performed? No    Cognitive Function:        05/12/2023    8:50 PM  6CIT Screen  What Year? 0 points  What month? 0 points  What time? 0 points  Count back from 20 0 points  Months in reverse 0 points  Repeat phrase 0 points  Total Score 0 points    Immunizations There is no immunization history for the selected administration types on file for this patient.  TDAP status: Due, Education has been provided regarding the importance of this vaccine. Advised may receive this vaccine at local pharmacy or Health Dept. Aware to provide a copy of the vaccination record if obtained from local pharmacy or Health Dept. Verbalized acceptance and understanding.  Pneumococcal vaccine status: Up to date  Covid-19 vaccine status: Information provided on how to obtain vaccines.   Qualifies for Shingles Vaccine? No    Screening Tests Health Maintenance  Topic Date Due   COVID-19 Vaccine (1) Never done   FOOT EXAM  Never done   OPHTHALMOLOGY EXAM  Never done   DTaP/Tdap/Td (1 - Tdap) Never done   INFLUENZA VACCINE  05/20/2023   HEMOGLOBIN A1C  09/16/2023   Diabetic kidney evaluation - eGFR measurement  03/15/2024   Diabetic kidney evaluation - Urine ACR  03/15/2024   Medicare Annual Wellness (AWV)  05/11/2024   PAP SMEAR-Modifier  12/15/2024   Hepatitis C Screening  Completed   HIV Screening  Completed   HPV VACCINES  Aged Out    Health Maintenance  Health Maintenance Due  Topic Date Due   COVID-19 Vaccine (1) Never done   FOOT EXAM  Never done   OPHTHALMOLOGY EXAM  Never done   DTaP/Tdap/Td (1 - Tdap) Never done    Mammogram status: Ordered today. Pt provided with contact info and advised to call to schedule appt.   Lung Cancer Screening: (Low Dose CT Chest recommended if Age 49-80 years, 20 pack-year currently smoking OR have quit  w/in 15years.) does not qualify.   Lung Cancer Screening Referral: n/a  Additional Screening:  Hepatitis C Screening: does qualify; Completed 03/16/23  Vision Screening: Recommended annual ophthalmology exams for early detection of glaucoma and other disorders of the eye. Is the patient up to date with their annual eye exam?  No  Who is the provider or what is the name of the office in which the patient attends annual eye exams? none If pt is not established with a provider, would they like to be referred to a provider to establish care? No .   Dental Screening: Recommended annual dental exams for proper oral hygiene  Diabetic Foot Exam: Diabetic Foot Exam: Overdue, Pt has been advised about the importance in completing this exam. Pt is scheduled for diabetic foot exam on at  next office visit.  Community Resource Referral / Chronic Care Management: CRR required this visit?  No   CCM required this visit?  No     Plan:     I have personally reviewed and noted the following in the patient's chart:   Medical and social history Use of alcohol, tobacco or illicit drugs  Current medications and supplements including opioid prescriptions. Patient is not currently taking opioid prescriptions. Functional ability and status Nutritional status Physical activity Advanced directives List of other physicians Hospitalizations, surgeries, and ER visits in previous 12 months Vitals Screenings to include cognitive, depression, and falls Referrals and appointments  In addition, I have reviewed and discussed with patient certain preventive protocols, quality metrics, and best practice recommendations. A written personalized care plan for preventive services as well as general preventive health recommendations were provided to patient.     Kandis Fantasia Anon [place], [place]   01/25/8118   After Visit Summary: (MyChart) Due to this being a telephonic visit, the after visit summary with patients  personalized plan was offered to patient via MyChart   Nurse Notes: No concerns at this time

## 2023-05-12 NOTE — Patient Instructions (Signed)
Ms. Heldt , Thank you for taking time to come for your Medicare Wellness Visit. I appreciate your ongoing commitment to your health goals. Please review the following plan we discussed and let me know if I can assist you in the future.   These are the goals we discussed:  Goals      Increase physical activity        This is a list of the screening recommended for you and due dates:  Health Maintenance  Topic Date Due   COVID-19 Vaccine (1) Never done   Complete foot exam   Never done   Eye exam for diabetics  Never done   DTaP/Tdap/Td vaccine (1 - Tdap) Never done   Flu Shot  05/20/2023   Hemoglobin A1C  09/16/2023   Yearly kidney function blood test for diabetes  03/15/2024   Yearly kidney health urinalysis for diabetes  03/15/2024   Medicare Annual Wellness Visit  05/11/2024   Pap Smear  12/15/2024   Hepatitis C Screening  Completed   HIV Screening  Completed   HPV Vaccine  Aged Out    Advanced directives: Information on Advanced Care Planning can be found at [facility] Advance Health Care Directives Advance Health Care Directives ([email or web address] Please bring a copy of your health care power of attorney and living will to the office to be added to your chart at your convenience.  Conditions/risks identified: Aim for 30 minutes of exercise or brisk walking, 6-8 glasses of water, and 5 servings of fruits and vegetables each day.  Next appointment: Follow up in one year for your annual wellness visit.   You have an order for:  []   2D Mammogram  [x]   3D Mammogram  []   Bone Density     Please call for appointment:   [facility] [address] [place], [place] [address] [phone]  Make sure to wear two-piece clothing.  No lotions, powders, or deodorants the day of the appointment. Make sure to bring picture ID and insurance card.  Bring list of medications you are currently taking including any supplements.   Schedule your  [place] screening mammogram through MyChart!   Log into your MyChart account.  Go to 'Visit' (or 'Appointments' if on mobile App) --> Schedule an Appointment  Under 'Select a Reason for Visit' choose the Mammogram Screening option.  Complete the pre-visit questions and select the time and place that best fits your schedule.    Preventive Care 40-64 Years, Female Preventive care refers to lifestyle choices and visits with your health care provider that can promote health and wellness. What does preventive care include? A yearly physical exam. This is also called an annual well check. Dental exams once or twice a year. Routine eye exams. Ask your health care provider how often you should have your eyes checked. Personal lifestyle choices, including: Daily care of your teeth and gums. Regular physical activity. Eating a healthy diet. Avoiding tobacco and drug use. Limiting alcohol use. Practicing safe sex. Taking low-dose aspirin daily starting at age 19. Taking vitamin and mineral supplements as recommended by your health care provider. What happens during an annual well check? The services and screenings done by your health care provider during your annual well check will depend on your age, overall health, lifestyle risk factors, and family history of disease. Counseling  Your health care provider may ask you questions about your: Alcohol use. Tobacco use. Drug use. Emotional well-being. Home and  relationship well-being. Sexual activity. Eating habits. Work and work Astronomer. Method of birth control. Menstrual cycle. Pregnancy history. Screening  You may have the following tests or measurements: Height, weight, and BMI. Blood pressure. Lipid and cholesterol levels. These may be checked every 5 years, or more frequently if you are over 25 years old. Skin check. Lung cancer screening. You may have this screening every year starting at age 48 if you have a  30-pack-year history of smoking and currently smoke or have quit within the past 15 years. Fecal occult blood test (FOBT) of the stool. You may have this test every year starting at age 56. Flexible sigmoidoscopy or colonoscopy. You may have a sigmoidoscopy every 5 years or a colonoscopy every 10 years starting at age 67. Hepatitis C blood test. Hepatitis B blood test. Sexually transmitted disease (STD) testing. Diabetes screening. This is done by checking your blood sugar (glucose) after you have not eaten for a while (fasting). You may have this done every 1-3 years. Mammogram. This may be done every 1-2 years. Talk to your health care provider about when you should start having regular mammograms. This may depend on whether you have a family history of breast cancer. BRCA-related cancer screening. This may be done if you have a family history of breast, ovarian, tubal, or peritoneal cancers. Pelvic exam and Pap test. This may be done every 3 years starting at age 56. Starting at age 55, this may be done every 5 years if you have a Pap test in combination with an HPV test. Bone density scan. This is done to screen for osteoporosis. You may have this scan if you are at high risk for osteoporosis. Discuss your test results, treatment options, and if necessary, the need for more tests with your health care provider. Vaccines  Your health care provider may recommend certain vaccines, such as: Influenza vaccine. This is recommended every year. Tetanus, diphtheria, and acellular pertussis (Tdap, Td) vaccine. You may need a Td booster every 10 years. Zoster vaccine. You may need this after age 31. Pneumococcal 13-valent conjugate (PCV13) vaccine. You may need this if you have certain conditions and were not previously vaccinated. Pneumococcal polysaccharide (PPSV23) vaccine. You may need one or two doses if you smoke cigarettes or if you have certain conditions. Talk to your health care provider about  which screenings and vaccines you need and how often you need them. This information is not intended to replace advice given to you by your health care provider. Make sure you discuss any questions you have with your health care provider. Document Released: 11/01/2015 Document Revised: 06/24/2016 Document Reviewed: 08/06/2015 Elsevier Interactive Patient Education  2017 [employer].    Fall Prevention in the Home Falls can cause injuries. They can happen to people of all ages. There are many things you can do to make your home safe and to help prevent falls. What can I do on the outside of my home? Regularly fix the edges of walkways and driveways and fix any cracks. Remove anything that might make you trip as you walk through a door, such as a raised step or threshold. Trim any bushes or trees on the path to your home. Use bright outdoor lighting. Clear any walking paths of anything that might make someone trip, such as rocks or tools. Regularly check to see if handrails are loose or broken. Make sure that both sides of any steps have handrails. Any raised decks and porches should have guardrails on the  edges. Have any leaves, snow, or ice cleared regularly. Use sand or salt on walking paths during winter. Clean up any spills in your garage right away. This includes oil or grease spills. What can I do in the bathroom? Use night lights. Install grab bars by the toilet and in the tub and shower. Do not use towel bars as grab bars. Use non-skid mats or decals in the tub or shower. If you need to sit down in the shower, use a plastic, non-slip stool. Keep the floor dry. Clean up any water that spills on the floor as soon as it happens. Remove soap buildup in the tub or shower regularly. Attach bath mats securely with double-sided non-slip rug tape. Do not have throw rugs and other things on the floor that can make you trip. What can I do in the bedroom? Use night lights. Make sure that  you have a light by your bed that is easy to reach. Do not use any sheets or blankets that are too big for your bed. They should not hang down onto the floor. Have a firm chair that has side arms. You can use this for support while you get dressed. Do not have throw rugs and other things on the floor that can make you trip. What can I do in the kitchen? Clean up any spills right away. Avoid walking on wet floors. Keep items that you use a lot in easy-to-reach places. If you need to reach something above you, use a strong step stool that has a grab bar. Keep electrical cords out of the way. Do not use floor polish or wax that makes floors slippery. If you must use wax, use non-skid floor wax. Do not have throw rugs and other things on the floor that can make you trip. What can I do with my stairs? Do not leave any items on the stairs. Make sure that there are handrails on both sides of the stairs and use them. Fix handrails that are broken or loose. Make sure that handrails are as long as the stairways. Check any carpeting to make sure that it is firmly attached to the stairs. Fix any carpet that is loose or worn. Avoid having throw rugs at the top or bottom of the stairs. If you do have throw rugs, attach them to the floor with carpet tape. Make sure that you have a light switch at the top of the stairs and the bottom of the stairs. If you do not have them, ask someone to add them for you. What else can I do to help prevent falls? Wear shoes that: Do not have high heels. Have rubber bottoms. Are comfortable and fit you well. Are closed at the toe. Do not wear sandals. If you use a stepladder: Make sure that it is fully opened. Do not climb a closed stepladder. Make sure that both sides of the stepladder are locked into place. Ask someone to hold it for you, if possible. Clearly mark and make sure that you can see: Any grab bars or handrails. First and last steps. Where the edge of each  step is. Use tools that help you move around (mobility aids) if they are needed. These include: Canes. Walkers. Scooters. Crutches. Turn on the lights when you go into a dark area. Replace any light bulbs as soon as they burn out. Set up your furniture so you have a clear path. Avoid moving your furniture around. If any of your floors are uneven,  fix them. If there are any pets around you, be aware of where they are. Review your medicines with your doctor. Some medicines can make you feel dizzy. This can increase your chance of falling. Ask your doctor what other things that you can do to help prevent falls. This information is not intended to replace advice given to you by your health care provider. Make sure you discuss any questions you have with your health care provider. Document Released: 08/01/2009 Document Revised: 03/12/2016 Document Reviewed: 11/09/2014 Elsevier Interactive Patient Education  2017 [employer].
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
## 2023-05-14 ENCOUNTER — Ambulatory Visit (INDEPENDENT_AMBULATORY_CARE_PROVIDER_SITE_OTHER): Payer: Medicare Other | Admitting: Neurology

## 2023-05-14 ENCOUNTER — Encounter: Payer: Self-pay | Admitting: Neurology

## 2023-05-14 VITALS — BP 138/80 | HR 68 | Ht 60.0 in | Wt 366.0 lb

## 2023-05-14 DIAGNOSIS — G40109 Localization-related (focal) (partial) symptomatic epilepsy and epileptic syndromes with simple partial seizures, not intractable, without status epilepticus: Secondary | ICD-10-CM | POA: Diagnosis not present

## 2023-05-14 DIAGNOSIS — G43009 Migraine without aura, not intractable, without status migrainosus: Secondary | ICD-10-CM | POA: Diagnosis not present

## 2023-05-14 MED ORDER — PHENYTOIN SODIUM EXTENDED 100 MG PO CAPS: 200.0000 mg | ORAL_CAPSULE | Freq: Two times a day (BID) | ORAL | 5 refills | Status: DC

## 2023-05-14 MED ORDER — GABAPENTIN 300 MG PO CAPS: 300.0000 mg | ORAL_CAPSULE | Freq: Two times a day (BID) | ORAL | 5 refills | Status: DC

## 2023-05-14 MED ORDER — TOPIRAMATE 200 MG PO TABS: 200.0000 mg | ORAL_TABLET | Freq: Two times a day (BID) | ORAL | 5 refills | Status: DC
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
## 2023-05-18 ENCOUNTER — Telehealth: Payer: Self-pay

## 2023-05-18 DIAGNOSIS — Z79899 Other long term (current) drug therapy: Secondary | ICD-10-CM

## 2023-05-18 NOTE — Telephone Encounter (Signed)
Patient advised Before the DMV forms can be signed we have to do a Dilantin Level now and in a couple of weeks.   Patient verbalized understanding.

## 2023-05-19 ENCOUNTER — Other Ambulatory Visit (INDEPENDENT_AMBULATORY_CARE_PROVIDER_SITE_OTHER): Payer: Medicare Other

## 2023-05-19 DIAGNOSIS — Z79899 Other long term (current) drug therapy: Secondary | ICD-10-CM | POA: Diagnosis not present

## 2023-05-21 ENCOUNTER — Ambulatory Visit
Admission: RE | Admit: 2023-05-21 | Discharge: 2023-05-21 | Disposition: A | Discharge status: Home / Self Care | Payer: Medicare Other | Source: Ambulatory Visit | Attending: Family Medicine | Admitting: Family Medicine

## 2023-05-21 DIAGNOSIS — Z1231 Encounter for screening mammogram for malignant neoplasm of breast: Secondary | ICD-10-CM

## 2023-06-10 ENCOUNTER — Ambulatory Visit: Payer: Medicare Other | Admitting: Dermatology

## 2023-06-23 ENCOUNTER — Ambulatory Visit
Admission: EM | Admit: 2023-06-23 | Discharge: 2023-06-23 | Disposition: A | Discharge status: Home / Self Care | Payer: Medicare Other | Attending: Physician Assistant | Admitting: Physician Assistant

## 2023-06-23 DIAGNOSIS — L304 Erythema intertrigo: Secondary | ICD-10-CM | POA: Diagnosis not present

## 2023-06-23 LAB — POCT FASTING CBG KUC MANUAL ENTRY: POCT Glucose (KUC): 142 mg/dL — AB (ref 70–99)

## 2023-06-23 MED ORDER — FLUCONAZOLE 200 MG PO TABS: 200.0000 mg | ORAL_TABLET | Freq: Every day | ORAL | 0 refills | Status: AC

## 2023-06-23 NOTE — ED Triage Notes (Signed)
Pt states she is having a red rash in skin crease on her left thigh that started 3 days ago. Pt tried nystatin cream on rash and states it helped some. Pt would also like a blood sugar check. Pt does have DM type 2 but does not have access to check her sugar at home.

## 2023-06-23 NOTE — ED Provider Notes (Signed)
[facility]    CSN: [id number] Arrival date & time: 06/23/23  1009      History   Chief Complaint Chief Complaint  Patient presents with   Rash    HPI [name] [name] is a 42 y.o. female.   Patient here today for evaluation of rash to her groin area primarily to the left in the skin fold.  She reports rash is somewhat itchy at times.  She has been using nystatin topically with mild relief but no resolution.  She is a diabetic and states she has not been able to get glucometer for measuring blood sugar.  She denies any fever, nausea or vomiting.  The history is provided by the patient.  Rash Associated symptoms: no abdominal pain, no fever, no nausea, no shortness of breath and not vomiting     Past Medical History:  Diagnosis Date   Allergy    Anemia    has taken iron supplements in the past   Arthritis    Bipolar affective disorder (HCC)    no meds currently;has been years since taking meds   Carpal tunnel syndrome on both sides    Gestational diabetes    H/O varicella    Hypertension    Pt states she had pre-eclampsia w last pregnancy    Infection    Yeast inf;not frequent   Migraines    would be rx'd Percacet for relief   Pregnancy induced hypertension    Seizures (HCC) 05/20/2011   unsure what started seizures;Neurology appt on 05/30/12;on Kepra 2000mg  bid;was on Topramax 50mg  tid   Wears glasses    Yeast infection     Patient Active Problem List   Diagnosis Date Noted   Diabetes mellitus (HCC) 07/06/2022   Epilepsy (HCC) 05/27/2012   Bipolar affective disorder (HCC) 05/27/2012    Past Surgical History:  Procedure Laterality Date   CESAREAN SECTION     CESAREAN SECTION N/A 12/26/2012   Procedure: Repeat cesarean section with delivery of baby;  Surgeon: Allie Bossier, MD;  Location: [facility];  Service: Obstetrics;  Laterality: N/A;   TONSILLECTOMY     During high school years   WISDOM TOOTH EXTRACTION     All 4 removed    OB History      Gravida  2   Para  2   Term  2   Preterm      AB      Living  2      SAB      IAB      Ectopic      Multiple      Live Births  2            Home Medications    Prior to Admission medications   Medication Sig Start Date End Date Taking? Authorizing Provider  fluconazole (DIFLUCAN) 200 MG tablet Take 1 tablet (200 mg total) by mouth daily for 7 days. 06/23/23 06/30/23 Yes Tomi Bamberger, PA-C  gabapentin (NEURONTIN) 300 MG capsule Take 1 capsule (300 mg total) by mouth 2 (two) times daily. 05/14/23  Yes Jaffe, Adam R, DO  glipiZIDE (GLUCOTROL) 10 MG tablet Take 1 tablet (10 mg total) by mouth 2 (two) times daily before a meal. 03/16/23  Yes Newlin, Enobong, MD  hydrOXYzine (ATARAX) 10 MG tablet Take 1 tablet (10 mg total) by mouth 3 (three) times daily as needed. 03/04/23  Yes Joycelyn Man M, PA-C  ketoconazole (NIZORAL) 2 % shampoo Apply 1 Application topically  2 (two) times a week. X 2 weeks 03/18/23  Yes Hoy Register, MD  phenytoin (DILANTIN) 100 MG ER capsule Take 2 capsules (200 mg total) by mouth 2 (two) times daily. 05/14/23  Yes Jaffe, Adam R, DO  tiZANidine (ZANAFLEX) 4 MG tablet Take 1 tablet (4 mg total) by mouth daily. 06/16/22  Yes Jaffe, Adam R, DO  topiramate (TOPAMAX) 200 MG tablet Take 1 tablet (200 mg total) by mouth 2 (two) times daily. 05/14/23  Yes Jaffe, Adam R, DO  cyclobenzaprine (FEXMID) 7.5 MG tablet Take 1 tablet (7.5 mg total) by mouth 3 (three) times daily as needed for muscle spasms. 03/11/22   Cresenzo, Cyndi Lennert, MD  furosemide (LASIX) 20 MG tablet Take 20 mg by mouth.    [provider]  lacosamide (VIMPAT) 50 MG TABS tablet Take 1 tablet (50 mg total) by mouth 2 (two) times daily. Patient not taking: Reported on 05/14/2023 11/13/22   Drema Dallas, DO  nystatin cream (MYCOSTATIN) Apply 1 Application topically 2 (two) times daily. 03/08/23   [name], PA-C  promethazine-dextromethorphan (PROMETHAZINE-DM) 6.25-15 MG/5ML syrup  Take 5 mLs by mouth at bedtime as needed for cough. 03/04/23   [name], PA-C  Semaglutide,0.25 or 0.5MG /DOS, (OZEMPIC, 0.25 OR 0.5 MG/DOSE,) 2 MG/1.5ML SOPN Inject 0.25 mg into the skin once a week. For 4 weeks then increase to 0.5 mg weekly 07/06/22   Hoy Register, MD  Semaglutide,0.25 or 0.5MG /DOS, (OZEMPIC, 0.25 OR 0.5 MG/DOSE,) 2 MG/1.5ML SOPN Inject 0.5 mg into the skin once a week. After completion of 4 weeks of 0.25 mg 03/16/23   Hoy Register, MD    Family History Family History  Problem Relation Age of Onset   Cancer Mother        Ovarian   Diabetes Mother    Hypertension Father    Cirrhosis Father    Arthritis Father    Obesity Father    Hypertension Brother    Obesity Brother    Asthma Son    Migraines Daughter    Anxiety disorder Daughter    Depression Daughter     Social History Social History   Tobacco Use   Smoking status: Never   Smokeless tobacco: Never  Substance Use Topics   Alcohol use: No   Drug use: No     Allergies   Patient has no known allergies.   Review of Systems Review of Systems  Constitutional:  Negative for chills and fever.  Eyes:  Negative for discharge and redness.  Respiratory:  Negative for shortness of breath.   Gastrointestinal:  Negative for abdominal pain, nausea and vomiting.  Skin:  Positive for rash.     Physical Exam Triage Vital Signs ED Triage Vitals [06/23/23 1022]  Encounter Vitals Group     BP      Systolic BP Percentile      Diastolic BP Percentile      Pulse      Resp      Temp      Temp src      SpO2      Weight      Height      Head Circumference      Peak Flow      Pain Score 0     Pain Loc      Pain Education      Exclude from Growth Chart    No data found.  Updated Vital Signs LMP 06/21/2023 (Exact Date)  Physical Exam Vitals and nursing note reviewed.  Constitutional:      General: She is not in acute distress.    Appearance: Normal appearance. She is not  ill-appearing.  HENT:     Head: Normocephalic and atraumatic.  Eyes:     Conjunctiva/sclera: Conjunctivae normal.  Cardiovascular:     Rate and Rhythm: Normal rate.  Pulmonary:     Effort: Pulmonary effort is normal. No respiratory distress.  Skin:    Comments: Confluent macular hyperpigmented rash to groin with well-demarcated borders  Neurological:     Mental Status: She is alert.  Psychiatric:        Mood and Affect: Mood normal.        Behavior: Behavior normal.        Thought Content: Thought content normal.      UC Treatments / Results  Labs (all labs ordered are listed, but only abnormal results are displayed) Labs Reviewed  POCT FASTING CBG [facility] MANUAL ENTRY - Abnormal; Notable for the following components:      Result Value   POCT Glucose ([facility]) 142 (*)    All other components within normal limits    EKG   Radiology No results found.  Procedures Procedures (including critical care time)  Medications Ordered in UC Medications - No data to display  Initial Impression / Assessment and Plan / UC Course  I have reviewed the triage vital signs and the nursing notes.  Pertinent labs & imaging results that were available during my care of the patient were reviewed by me and considered in my medical decision making (see chart for details).    Will treat to cover intertrigo with Diflucan.  Encouraged continued topical nystatin and follow-up if no gradual improvement or if any further concerns.   Final diagnoses:  Intertrigo   Discharge Instructions   None    ED Prescriptions     Medication Sig Dispense Auth. Provider   fluconazole (DIFLUCAN) 200 MG tablet Take 1 tablet (200 mg total) by mouth daily for 7 days. 7 tablet Tomi Bamberger, PA-C      PDMP not reviewed this encounter.   Tomi Bamberger, PA-C 06/23/23 1152

## 2023-08-12 ENCOUNTER — Other Ambulatory Visit: Payer: Self-pay | Admitting: Family Medicine

## 2023-08-12 DIAGNOSIS — L219 Seborrheic dermatitis, unspecified: Secondary | ICD-10-CM

## 2023-08-12 DIAGNOSIS — E1169 Type 2 diabetes mellitus with other specified complication: Secondary | ICD-10-CM

## 2023-08-13 ENCOUNTER — Ambulatory Visit
Admission: RE | Admit: 2023-08-13 | Discharge: 2023-08-13 | Disposition: A | Discharge status: Home / Self Care | Payer: Medicare Other | Source: Ambulatory Visit | Attending: Physician Assistant | Admitting: Physician Assistant

## 2023-08-13 VITALS — BP 142/83 | HR 84 | Temp 98.0°F | Resp 20 | Ht 62.0 in | Wt 370.0 lb

## 2023-08-13 DIAGNOSIS — H65192 Other acute nonsuppurative otitis media, left ear: Secondary | ICD-10-CM | POA: Diagnosis not present

## 2023-08-13 DIAGNOSIS — R591 Generalized enlarged lymph nodes: Secondary | ICD-10-CM

## 2023-08-13 LAB — POCT FASTING CBG KUC MANUAL ENTRY: POCT Glucose (KUC): 226 mg/dL — AB (ref 70–99)

## 2023-08-13 MED ORDER — OZEMPIC (0.25 OR 0.5 MG/DOSE) 2 MG/1.5ML ~~LOC~~ SOPN
0.2500 mg | PEN_INJECTOR | SUBCUTANEOUS | 0 refills | Status: DC
Start: 2023-08-13 — End: 2023-10-07

## 2023-08-13 MED ORDER — KETOCONAZOLE 2 % EX SHAM: 1.0000 | MEDICATED_SHAMPOO | CUTANEOUS | 0 refills | Status: DC

## 2023-08-13 MED ORDER — AMOXICILLIN 500 MG PO CAPS: 500.0000 mg | ORAL_CAPSULE | Freq: Three times a day (TID) | ORAL | 0 refills | Status: DC

## 2023-08-13 NOTE — ED Triage Notes (Signed)
severe headaches, pain in left ear an under left arm, been going on for about a week now. - Entered by patient  Additional details: "I keep having bad headaches and have maybe a bump in my left ear as well as a bump/sore in left armpit". "I used to have seizures and the last one was a long time ago but occasionally my left arm hurts as well". No chest pain. No cough. No sob. No URI symptoms.

## 2023-08-23 ENCOUNTER — Encounter: Payer: Self-pay | Admitting: Podiatry

## 2023-08-23 ENCOUNTER — Ambulatory Visit (INDEPENDENT_AMBULATORY_CARE_PROVIDER_SITE_OTHER): Payer: Medicare Other | Admitting: Podiatry

## 2023-08-23 VITALS — Ht 62.0 in | Wt 370.0 lb

## 2023-08-23 DIAGNOSIS — E119 Type 2 diabetes mellitus without complications: Secondary | ICD-10-CM | POA: Diagnosis not present

## 2023-08-23 DIAGNOSIS — L853 Xerosis cutis: Secondary | ICD-10-CM

## 2023-08-23 DIAGNOSIS — E1140 Type 2 diabetes mellitus with diabetic neuropathy, unspecified: Secondary | ICD-10-CM | POA: Diagnosis not present

## 2023-08-23 DIAGNOSIS — R601 Generalized edema: Secondary | ICD-10-CM | POA: Insufficient documentation

## 2023-08-23 MED ORDER — AMMONIUM LACTATE 12 % EX CREA: 1.0000 | TOPICAL_CREAM | CUTANEOUS | 3 refills | Status: AC | PRN

## 2023-08-23 MED ORDER — GABAPENTIN 300 MG PO CAPS: 300.0000 mg | ORAL_CAPSULE | Freq: Three times a day (TID) | ORAL | 4 refills | Status: DC

## 2023-08-23 NOTE — Progress Notes (Signed)
Chief Complaint  Patient presents with   Nail Problem    [facility]   HPI: 42 y.o. female presents today for a diabetic foot check.  States her blood sugar has gotten pretty high lately, noting her last A1c level around 11-12.  She does note some tingling in her feet, left worse than right, and her swelling in her legs/feet is slightly worse in the left than the right.  She already takes gabapentin 300mg  BID, but has been on this dose for over a year.  She has to be careful with medications she takes due to epilepsy.  Notes some dry/ashy skin and is open to recommendations for moisturizing cream.  Past Medical History:  Diagnosis Date   Allergy    Anemia    has taken iron supplements in the past   Arthritis    Bipolar affective disorder (HCC)    no meds currently;has been years since taking meds   Carpal tunnel syndrome on both sides    Gestational diabetes    H/O varicella    Hypertension    Pt states she had pre-eclampsia w last pregnancy    Infection    Yeast inf;not frequent   Migraines    would be rx'd Percacet for relief   Pregnancy induced hypertension    Seizures (HCC) 05/20/2011   unsure what started seizures;Neurology appt on 05/30/12;on Kepra 2000mg  bid;was on Topramax 50mg  tid   Wears glasses    Yeast infection    Past Surgical History:  Procedure Laterality Date   CESAREAN SECTION     CESAREAN SECTION N/A 12/26/2012   Procedure: Repeat cesarean section with delivery of baby;  Surgeon: Allie Bossier, MD;  Location: [facility];  Service: Obstetrics;  Laterality: N/A;   TONSILLECTOMY     During high school years   WISDOM TOOTH EXTRACTION     All 4 removed   Allergies  Allergen Reactions   Levetiracetam    Tramadol Other (See Comments)    Other Reaction(s): Other (see comments), Unknown  seizures  Other reaction(s): Other (see comments)  seizures  Other reaction(s): Other (see comments)  seizures  seizures  seizures  Other reaction(s): Other (see comments)   seizures  seizures  Other reaction(s): Other (see comments)  seizures  Other reaction(s): Other (see comments)  seizures  seizures  Other reaction(s): Other (see comments)  seizures  seizures  Other reaction(s): Other (see comments)  seizures  Other reaction(s): Other (see comments)  seizures  seizures   Physical Exam: General: The patient is alert and oriented x3 in no acute distress.  Dermatology: Skin is warm and dry, bilateral lower extremities. Interspaces are clear of maceration and debris.    Vascular: Palpable pedal pulses bilaterally. Capillary refill within normal limits.  +1 edema bilateral legs and ankles.  Neurological: Light touch sensation grossly intact bilateral feet. Negative Tinel's sign with percussion of posterior tibial nerve.  Intact vibration, temperature, and protective sensation with Semmes-weinstein monofilament bilateral feet.    Musculoskeletal Exam: Ankle df less than 10 degrees with knee extended bilateral.  MMT 5/5  Assessment/Plan of Care: 1. Encounter for diabetic foot exam (HCC)   2. Generalized edema   3. Xerosis of skin   4. Controlled type 2 diabetes with neuropathy (HCC)     Meds ordered this encounter  Medications   gabapentin (NEURONTIN) 300 MG capsule    Sig: Take 1 capsule (300 mg total) by mouth 3 (three) times daily.    Dispense:  90 capsule  Refill:  4   ammonium lactate (AMLACTIN) 12 % cream    Sig: Apply 1 Application topically as needed for dry skin.    Dispense:  385 g    Refill:  3   COMPRESSION STOCKINGS - Rx given to patient to take to a medical supply store.  Discussed how /when to wear these during waking hours.  Rx AmLactin cream for every day use on skin.  Discussed how patients can develop tolerance to gabapentin and occasionally need dose adjustment.  Will increase the frequency of the medication from BID to TID, but keep dosage at 300mg .  Informed patient she doesn't have loss of protective sensation at  this time, but continued high blood sugars could make her neuropathy symptoms worsen.  Discussed risks/side effects of medication.  F/u 6 months or prn, for diabetic foot check   Illona Bulman DBurna Mortimer, DPM, FACFAS [facility]     [address] [place], [place] [address]                Office [phone]  Fax [phone]

## 2023-08-30 ENCOUNTER — Ambulatory Visit: Payer: Medicare Other

## 2023-08-30 ENCOUNTER — Telehealth: Payer: Medicare Other | Admitting: Nurse Practitioner

## 2023-08-30 DIAGNOSIS — M545 Low back pain, unspecified: Secondary | ICD-10-CM | POA: Diagnosis not present

## 2023-08-30 MED ORDER — CYCLOBENZAPRINE HCL 7.5 MG PO TABS: 7.5000 mg | ORAL_TABLET | Freq: Three times a day (TID) | ORAL | 0 refills | Status: AC | PRN

## 2023-08-30 MED ORDER — NAPROXEN 500 MG PO TABS: 500.0000 mg | ORAL_TABLET | Freq: Two times a day (BID) | ORAL | 0 refills | Status: AC
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
## 2023-08-31 ENCOUNTER — Other Ambulatory Visit: Payer: Self-pay | Admitting: Family Medicine

## 2023-08-31 DIAGNOSIS — E1169 Type 2 diabetes mellitus with other specified complication: Secondary | ICD-10-CM

## 2023-09-07 NOTE — ED Provider Notes (Signed)
[facility]    CSN: [id number] Arrival date & time: 08/13/23  7829      History   Chief Complaint Chief Complaint  Patient presents with   Headache    Appt   Arm Pain (Left)    HPI [name] [name] is a 42 y.o. female.   Patient here today for evaluation of bad headaches that have been going on for the last 3 days.  She also reports pain in her left ear and has noticed a lump under her left arm.  She states that she has had this for about a week.  She has not had any fever.  She denies any shortness of breath or cough.  She has not any unexplained weight loss.  The history is provided by the patient.  Headache Associated symptoms: ear pain   Associated symptoms: no abdominal pain, no congestion, no cough, no diarrhea, no fever, no nausea, no sore throat and no vomiting     Past Medical History:  Diagnosis Date   Allergy    Anemia    has taken iron supplements in the past   Arthritis    Bipolar affective disorder (HCC)    no meds currently;has been years since taking meds   Carpal tunnel syndrome on both sides    Gestational diabetes    H/O varicella    Hypertension    Pt states she had pre-eclampsia w last pregnancy    Infection    Yeast inf;not frequent   Migraines    would be rx'd Percacet for relief   Pregnancy induced hypertension    Seizures (HCC) 05/20/2011   unsure what started seizures;Neurology appt on 05/30/12;on Kepra 2000mg  bid;was on Topramax 50mg  tid   Wears glasses    Yeast infection     Patient Active Problem List   Diagnosis Date Noted   Generalized edema 08/23/2023   Diabetes mellitus (HCC) 07/06/2022   Epilepsy (HCC) 05/27/2012   Bipolar affective disorder (HCC) 05/27/2012    Past Surgical History:  Procedure Laterality Date   CESAREAN SECTION     CESAREAN SECTION N/A 12/26/2012   Procedure: Repeat cesarean section with delivery of baby;  Surgeon: Allie Bossier, MD;  Location: [facility];  Service: Obstetrics;  Laterality: N/A;    TONSILLECTOMY     During high school years   WISDOM TOOTH EXTRACTION     All 4 removed    OB History     Gravida  2   Para  2   Term  2   Preterm      AB      Living  2      SAB      IAB      Ectopic      Multiple      Live Births  2            Home Medications    Prior to Admission medications   Medication Sig Start Date End Date Taking? Authorizing Provider  amoxicillin (AMOXIL) 500 MG capsule Take 1 capsule (500 mg total) by mouth 3 (three) times daily. 08/13/23  Yes Tomi Bamberger, PA-C  ammonium lactate (AMLACTIN) 12 % cream Apply 1 Application topically as needed for dry skin. 08/23/23   McCaughan, Dia D, DPM  cyclobenzaprine (FEXMID) 7.5 MG tablet Take 1 tablet (7.5 mg total) by mouth 3 (three) times daily as needed for up to 15 days for muscle spasms. 08/30/23 09/14/23  [name], FNP  furosemide (LASIX)  20 MG tablet Take 20 mg by mouth.    [provider]  gabapentin (NEURONTIN) 300 MG capsule Take 1 capsule (300 mg total) by mouth 3 (three) times daily. 08/23/23   McCaughan, Dia D, DPM  glipiZIDE (GLUCOTROL) 10 MG tablet Take 1 tablet (10 mg total) by mouth 2 (two) times daily before a meal. Must have office visit for refills 09/01/23   Hoy Register, MD  hydrOXYzine (ATARAX) 10 MG tablet Take 1 tablet (10 mg total) by mouth 3 (three) times daily as needed. 03/04/23   [name], PA-C  ketoconazole (NIZORAL) 2 % shampoo Apply 1 Application topically 2 (two) times a week. X 2 weeks 08/16/23   Hoy Register, MD  lacosamide (VIMPAT) 50 MG TABS tablet Take 1 tablet (50 mg total) by mouth 2 (two) times daily. 11/13/22   Drema Dallas, DO  naproxen (NAPROSYN) 500 MG tablet Take 1 tablet (500 mg total) by mouth 2 (two) times daily with a meal for 20 days. 08/30/23 09/19/23  [name], FNP  nystatin cream (MYCOSTATIN) Apply 1 Application topically 2 (two) times daily. 03/08/23   [name], PA-C  phenytoin (DILANTIN) 100  MG ER capsule Take 2 capsules (200 mg total) by mouth 2 (two) times daily. 05/14/23   Drema Dallas, DO  promethazine-dextromethorphan (PROMETHAZINE-DM) 6.25-15 MG/5ML syrup Take 5 mLs by mouth at bedtime as needed for cough. 03/04/23   [name], PA-C  Semaglutide,0.25 or 0.5MG /DOS, (OZEMPIC, 0.25 OR 0.5 MG/DOSE,) 2 MG/1.5ML SOPN Inject 0.5 mg into the skin once a week. After completion of 4 weeks of 0.25 mg 03/16/23   Hoy Register, MD  Semaglutide,0.25 or 0.5MG /DOS, (OZEMPIC, 0.25 OR 0.5 MG/DOSE,) 2 MG/1.5ML SOPN Inject 0.25 mg into the skin once a week. For 4 weeks then increase to 0.5 mg weekly 08/13/23   Hoy Register, MD  topiramate (TOPAMAX) 200 MG tablet Take 1 tablet (200 mg total) by mouth 2 (two) times daily. 05/14/23   Drema Dallas, DO    Family History Family History  Problem Relation Age of Onset   Cancer Mother        Ovarian   Diabetes Mother    Hypertension Father    Cirrhosis Father    Arthritis Father    Obesity Father    Hypertension Brother    Obesity Brother    Asthma Son    Migraines Daughter    Anxiety disorder Daughter    Depression Daughter     Social History Social History   Tobacco Use   Smoking status: Never   Smokeless tobacco: Never  Vaping Use   Vaping status: Never Used  Substance Use Topics   Alcohol use: No   Drug use: No     Allergies   Levetiracetam and Tramadol   Review of Systems Review of Systems  Constitutional:  Negative for chills, fever and unexpected weight change.  HENT:  Positive for ear pain. Negative for congestion and sore throat.   Eyes:  Negative for discharge and redness.  Respiratory:  Negative for cough, shortness of breath and wheezing.   Gastrointestinal:  Negative for abdominal pain, diarrhea, nausea and vomiting.  Neurological:  Positive for headaches.  Hematological:  Positive for adenopathy.     Physical Exam Triage Vital Signs ED Triage Vitals  Encounter Vitals Group     BP 08/13/23  0958 (!) 142/83     Systolic BP Percentile --      Diastolic BP Percentile --  Pulse Rate 08/13/23 0958 84     Resp 08/13/23 0958 20     Temp 08/13/23 0958 98 F (36.7 C)     Temp Source 08/13/23 0958 Oral     SpO2 08/13/23 0958 96 %     Weight 08/13/23 0956 (!) 370 lb (167.8 kg)     Height 08/13/23 0956 5\' 2"  (1.575 m)     Head Circumference --      Peak Flow --      Pain Score 08/13/23 0955 3     Pain Loc --      Pain Education --      Exclude from Growth Chart --    No data found.  Updated Vital Signs BP (!) 142/83 (BP Location: Right Arm)   Pulse 84   Temp 98 F (36.7 C) (Oral)   Resp 20   Ht 5\' 2"  (1.575 m)   Wt (!) 370 lb (167.8 kg)   LMP 07/19/2023 (Approximate)   SpO2 96%   BMI 67.67 kg/m   Visual Acuity Right Eye Distance:   Left Eye Distance:   Bilateral Distance:    Right Eye Near:   Left Eye Near:    Bilateral Near:     Physical Exam Vitals and nursing note reviewed.  Constitutional:      General: She is not in acute distress.    Appearance: Normal appearance. She is not ill-appearing.  HENT:     Head: Normocephalic and atraumatic.     Right Ear: Tympanic membrane normal.     Ears:     Comments: Left EAC clear, left TM erythematous    Nose: No congestion.     Mouth/Throat:     Mouth: Mucous membranes are moist.     Pharynx: No oropharyngeal exudate or posterior oropharyngeal erythema.  Eyes:     Conjunctiva/sclera: Conjunctivae normal.  Cardiovascular:     Rate and Rhythm: Normal rate and regular rhythm.     Heart sounds: Normal heart sounds. No murmur heard. Pulmonary:     Effort: Pulmonary effort is normal. No respiratory distress.     Breath sounds: Normal breath sounds. No wheezing, rhonchi or rales.  Lymphadenopathy:     Upper Body:     Left upper body: Axillary adenopathy present.     Comments: Single small axillary lymph node palpated, semimobile  Skin:    General: Skin is warm and dry.  Neurological:     Mental Status: She  is alert.  Psychiatric:        Mood and Affect: Mood normal.        Thought Content: Thought content normal.      UC Treatments / Results  Labs (all labs ordered are listed, but only abnormal results are displayed) Labs Reviewed  POCT FASTING CBG [facility] MANUAL ENTRY - Abnormal; Notable for the following components:      Result Value   POCT Glucose ([facility]) 226 (*)    All other components within normal limits    EKG   Radiology No results found.  Procedures Procedures (including critical care time)  Medications Ordered in UC Medications - No data to display  Initial Impression / Assessment and Plan / UC Course  I have reviewed the triage vital signs and the nursing notes.  Pertinent labs & imaging results that were available during my care of the patient were reviewed by me and considered in my medical decision making (see chart for details).    Amoxicillin prescribed to cover  otitis media.  Recommended she continue to monitor lymphadenopathy and follow-up if no gradual improvement or with any worsening symptoms.   Patient expresses understanding. Mammogram approximately 3 months ago.   Final Clinical Impressions(s) / UC Diagnoses   Final diagnoses:  Other acute nonsuppurative otitis media of left ear, recurrence not specified  Lymphadenopathy   Discharge Instructions   None    ED Prescriptions     Medication Sig Dispense Auth. Provider   amoxicillin (AMOXIL) 500 MG capsule Take 1 capsule (500 mg total) by mouth 3 (three) times daily. 21 capsule Tomi Bamberger, PA-C      PDMP not reviewed this encounter.   Tomi Bamberger, PA-C 09/07/23 [phone]

## 2023-09-10 ENCOUNTER — Other Ambulatory Visit: Payer: Self-pay | Admitting: Family Medicine

## 2023-09-10 ENCOUNTER — Encounter: Payer: Self-pay | Admitting: Family Medicine

## 2023-09-10 MED ORDER — ATORVASTATIN CALCIUM 20 MG PO TABS: 20.0000 mg | ORAL_TABLET | Freq: Every day | ORAL | 1 refills | Status: DC

## 2023-09-23 ENCOUNTER — Ambulatory Visit: Payer: Medicare Other | Admitting: Physician Assistant

## 2023-09-29 ENCOUNTER — Telehealth: Payer: Medicare Other | Admitting: Nurse Practitioner

## 2023-09-29 DIAGNOSIS — F439 Reaction to severe stress, unspecified: Secondary | ICD-10-CM | POA: Diagnosis not present

## 2023-09-29 NOTE — Progress Notes (Signed)
Virtual Visit Consent   [name] [name], you are scheduled for a virtual visit with a [facility] provider today. Just as with appointments in the office, your consent must be obtained to participate. Your consent will be active for this visit and any virtual visit you may have with one of our providers in the next 365 days. If you have a MyChart account, a copy of this consent can be sent to you electronically.  As this is a virtual visit, video technology does not allow for your provider to perform a traditional examination. This may limit your provider's ability to fully assess your condition. If your provider identifies any concerns that need to be evaluated in person or the need to arrange testing (such as labs, EKG, etc.), we will make arrangements to do so. Although advances in technology are sophisticated, we cannot ensure that it will always work on either your end or our end. If the connection with a video visit is poor, the visit may have to be switched to a telephone visit. With either a video or telephone visit, we are not always able to ensure that we have a secure connection.  By engaging in this virtual visit, you consent to the provision of healthcare and authorize for your insurance to be billed (if applicable) for the services provided during this visit. Depending on your insurance coverage, you may receive a charge related to this service.  I need to obtain your verbal consent now. Are you willing to proceed with your visit today? [name] [name] has provided verbal consent on 09/29/2023 for a virtual visit (video or telephone). [name], FNP  Date: 09/29/2023 5:17 PM  Virtual Visit via Video Note   I, [name], [name]  ([id number], 1981-09-24) on 09/29/23 at  5:15 PM EST by a video-enabled telemedicine application and verified that I am speaking with the correct person using two identifiers.  Location: Patient: Virtual Visit Location Patient:  Home Provider: Virtual Visit Location Provider: Home Office   I discussed the limitations of evaluation and management by telemedicine and the availability of in person appointments. The patient expressed understanding and agreed to proceed.    History of Present Illness: [name] [name] is a 42 y.o. who identifies as a female who was assigned female at birth, and is being seen today for stress, lack of sleep.   Her period is late this month  She is not sexually active   She has a daughter with anxiety/depression and behavioral issues that causes her to worry   States she is worried about a lot of stuff Denies recent illness She feels she is in need of therapy   She has been diagnosed with bipolar in the past, is not currently medicated  Also has epilepsy and does not like to take a lot of medicine due to that and concern over mediation interactions   She does not have anyone to walk through her stressors with currently  She does not work currently  Has a grown son that is out of the house   She was seen 08/30/23 with back pain  She has an appointment with her PCP next week    Problems:  Patient Active Problem List   Diagnosis Date Noted   Generalized edema 08/23/2023   Diabetes mellitus (HCC) 07/06/2022   Epilepsy (HCC) 05/27/2012   Bipolar affective disorder (HCC) 05/27/2012    Allergies:  Allergies  Allergen Reactions   Levetiracetam    Tramadol Other (See  Comments)    Other Reaction(s): Other (see comments), Unknown  seizures  Other reaction(s): Other (see comments)  seizures  Other reaction(s): Other (see comments)  seizures  seizures  seizures  Other reaction(s): Other (see comments)  seizures  seizures  Other reaction(s): Other (see comments)  seizures  Other reaction(s): Other (see comments)  seizures  seizures  Other reaction(s): Other (see comments)  seizures  seizures  Other reaction(s): Other (see comments)  seizures  Other reaction(s):  Other (see comments)  seizures  seizures   Medications:  Current Outpatient Medications:    ammonium lactate (AMLACTIN) 12 % cream, Apply 1 Application topically as needed for dry skin., Disp: 385 g, Rfl: 3   amoxicillin (AMOXIL) 500 MG capsule, Take 1 capsule (500 mg total) by mouth 3 (three) times daily., Disp: 21 capsule, Rfl: 0   atorvastatin (LIPITOR) 20 MG tablet, Take 1 tablet (20 mg total) by mouth daily., Disp: 90 tablet, Rfl: 1   furosemide (LASIX) 20 MG tablet, Take 20 mg by mouth., Disp: , Rfl:    gabapentin (NEURONTIN) 300 MG capsule, Take 1 capsule (300 mg total) by mouth 3 (three) times daily., Disp: 90 capsule, Rfl: 4   glipiZIDE (GLUCOTROL) 10 MG tablet, Take 1 tablet (10 mg total) by mouth 2 (two) times daily before a meal. Must have office visit for refills, Disp: 60 tablet, Rfl: 0   hydrOXYzine (ATARAX) 10 MG tablet, Take 1 tablet (10 mg total) by mouth 3 (three) times daily as needed., Disp: 30 tablet, Rfl: 0   ketoconazole (NIZORAL) 2 % shampoo, Apply 1 Application topically 2 (two) times a week. X 2 weeks, Disp: 120 mL, Rfl: 0   lacosamide (VIMPAT) 50 MG TABS tablet, Take 1 tablet (50 mg total) by mouth 2 (two) times daily., Disp: 60 tablet, Rfl: 11   nystatin cream (MYCOSTATIN), Apply 1 Application topically 2 (two) times daily., Disp: 60 g, Rfl: 0   phenytoin (DILANTIN) 100 MG ER capsule, Take 2 capsules (200 mg total) by mouth 2 (two) times daily., Disp: 120 capsule, Rfl: 5   promethazine-dextromethorphan (PROMETHAZINE-DM) 6.25-15 MG/5ML syrup, Take 5 mLs by mouth at bedtime as needed for cough., Disp: 118 mL, Rfl: 0   Semaglutide,0.25 or 0.5MG /DOS, (OZEMPIC, 0.25 OR 0.5 MG/DOSE,) 2 MG/1.5ML SOPN, Inject 0.5 mg into the skin once a week. After completion of 4 weeks of 0.25 mg, Disp: 2 mL, Rfl: 1   Semaglutide,0.25 or 0.5MG /DOS, (OZEMPIC, 0.25 OR 0.5 MG/DOSE,) 2 MG/1.5ML SOPN, Inject 0.25 mg into the skin once a week. For 4 weeks then increase to 0.5 mg weekly, Disp: 2 mL,  Rfl: 0   topiramate (TOPAMAX) 200 MG tablet, Take 1 tablet (200 mg total) by mouth 2 (two) times daily., Disp: 60 tablet, Rfl: 5  Observations/Objective: Patient is well-developed, well-nourished in no acute distress.  Resting comfortably  at home.  Head is normocephalic, atraumatic.  No labored breathing.  Speech is clear and coherent with logical content.  Patient is alert and oriented at baseline.    Assessment and Plan:  1. Stress at home  Discussed [facility] for help tonight if needed Otherwise advised following up with PCP's office tomorrow   Will place referral for social work to assist with counseling   - AMB Referral [facility] Care Management     Follow Up Instructions: I discussed the assessment and treatment plan with the patient. The patient was provided an opportunity to ask questions and all were answered. The patient agreed with the plan  and demonstrated an understanding of the instructions.  A copy of instructions were sent to the patient via MyChart unless otherwise noted below.    The patient was advised to call back or seek an in-person evaluation if the symptoms worsen or if the condition fails to improve as anticipated.    [name], FNP

## 2023-09-29 NOTE — Patient Instructions (Addendum)
[facility]   Call [phone] for assistance with any of our services   [phone]  Address:  [address]  [place], [place] [address]  Hours:  Open 24/7, No appointment required.

## 2023-09-30 ENCOUNTER — Telehealth: Payer: Self-pay | Admitting: *Deleted

## 2023-09-30 NOTE — Progress Notes (Signed)
  Care Coordination  Outreach Note  09/30/2023 Name: Zuri Saccomanno MRN: [id number] DOB: 1981-10-18   Care Coordination Outreach Attempts: An unsuccessful telephone outreach was attempted today to offer the patient information about available care coordination services.  Follow Up Plan:  Additional outreach attempts will be made to offer the patient care coordination information and services.   Encounter Outcome:  No Answer  Gwenevere Ghazi  Care Coordination Care Guide  Direct Dial: [phone]

## 2023-10-01 NOTE — Progress Notes (Unsigned)
  Care Coordination  Outreach Note  10/01/2023 Name: Armonie Falkner MRN: [id number] DOB: 1980-10-23   Care Coordination Outreach Attempts: A telephone outreach was attempted today to offer the patient with information about available care coordination services.  Follow Up Plan:  Additional outreach attempts will be made to offer the patient care coordination information and services.   Encounter Outcome:  Patient Request to Call Back  [facility] Coordination Care Guide  Direct Dial: [phone]

## 2023-10-05 NOTE — Telephone Encounter (Signed)
Complex Care Management Note   10/05/2023 Name: Dustyn Meredith MRN: [id number] DOB: [birth year]  Arkisha Penfold is a 42 y.o. year old female who sees Hoy Register, MD for primary care. I reached out to [name] by phone today to offer complex care management services.  Ms. Saulino was given information about Complex Care Management services today including:   The Complex Care Management services include support from the care team which includes your Nurse Coordinator, Clinical Social Worker, or Pharmacist.  The Complex Care Management team is here to help remove barriers to the health concerns and goals most important to you. Complex Care Management services are voluntary, and the patient may decline or stop services at any time by request to their care team member.   Complex Care Management Consent Status: Patient agreed to services and verbal consent obtained.   Follow up plan:  Telephone appointment with complex care management team member scheduled for:  12/30  Encounter Outcome:  Patient Scheduled  [facility] Coordination Care Guide  Direct Dial: [phone]

## 2023-10-07 ENCOUNTER — Ambulatory Visit: Payer: Medicare Other | Attending: Physician Assistant | Admitting: Physician Assistant

## 2023-10-07 VITALS — BP 135/87 | HR 98 | Wt 375.4 lb

## 2023-10-07 DIAGNOSIS — R635 Abnormal weight gain: Secondary | ICD-10-CM | POA: Diagnosis not present

## 2023-10-07 DIAGNOSIS — E1165 Type 2 diabetes mellitus with hyperglycemia: Secondary | ICD-10-CM | POA: Insufficient documentation

## 2023-10-07 DIAGNOSIS — R601 Generalized edema: Secondary | ICD-10-CM | POA: Diagnosis not present

## 2023-10-07 DIAGNOSIS — Z7984 Long term (current) use of oral hypoglycemic drugs: Secondary | ICD-10-CM | POA: Diagnosis not present

## 2023-10-07 DIAGNOSIS — Z7985 Long-term (current) use of injectable non-insulin antidiabetic drugs: Secondary | ICD-10-CM | POA: Diagnosis not present

## 2023-10-07 DIAGNOSIS — Z91199 Patient's noncompliance with other medical treatment and regimen due to unspecified reason: Secondary | ICD-10-CM | POA: Insufficient documentation

## 2023-10-07 DIAGNOSIS — Z79899 Other long term (current) drug therapy: Secondary | ICD-10-CM | POA: Diagnosis not present

## 2023-10-07 DIAGNOSIS — Z713 Dietary counseling and surveillance: Secondary | ICD-10-CM | POA: Insufficient documentation

## 2023-10-07 LAB — GLUCOSE, POCT (MANUAL RESULT ENTRY): POC Glucose: 255 mg/dL — AB (ref 70–99)

## 2023-10-07 LAB — POCT GLYCOSYLATED HEMOGLOBIN (HGB A1C): HbA1c, POC (controlled diabetic range): 10.5 % — AB (ref 0.0–7.0)

## 2023-10-07 MED ORDER — TIRZEPATIDE 2.5 MG/0.5ML ~~LOC~~ SOAJ: 2.5000 mg | SUBCUTANEOUS | 1 refills | Status: DC

## 2023-10-07 MED ORDER — FUROSEMIDE 20 MG PO TABS: 20.0000 mg | ORAL_TABLET | Freq: Every day | ORAL | 0 refills | Status: DC

## 2023-10-07 MED ORDER — ATORVASTATIN CALCIUM 20 MG PO TABS: 20.0000 mg | ORAL_TABLET | Freq: Every day | ORAL | 1 refills | Status: DC

## 2023-10-07 MED ORDER — METFORMIN HCL 1000 MG PO TABS: 1000.0000 mg | ORAL_TABLET | Freq: Two times a day (BID) | ORAL | 3 refills | Status: AC

## 2023-10-07 NOTE — Progress Notes (Signed)
Patient ID: [name] [name], female   DOB: 03-01-1981, 42 y.o.   MRN: [id number]   [name] [name], is a 42 y.o. female  [id number]:[id number]  [id number]  DOB - 1981-06-14  Chief Complaint  Patient presents with   Medication Refill       Subjective:   [name] [name] is a 42 y.o. female here today for several concerns with her medications.  She has been taking glipizide but she has been off of ozempic for several months bc she thought it was giving her blurry vision(probably blood sugars changing).   She is also concerned about weight gain.  weight on 12/13 was 272.  She feels that glipizide is making her gain weight.  She is not following diabetic or heart healthy diet.  No exercise.  She is c/o leg swelling that is intermittent.  Wants to go back on metformin.  She does not feel like she should have to take anything more than metformin for her diabetes bc that is "all I used to take" and it was controlled.  She is open to trying mounjaro.    No problems updated.  ALLERGIES: Allergies  Allergen Reactions   Levetiracetam    Tramadol Other (See Comments)    Other Reaction(s): Other (see comments), Unknown  seizures  Other reaction(s): Other (see comments)  seizures  Other reaction(s): Other (see comments)  seizures  seizures  seizures  Other reaction(s): Other (see comments)  seizures  seizures  Other reaction(s): Other (see comments)  seizures  Other reaction(s): Other (see comments)  seizures  seizures  Other reaction(s): Other (see comments)  seizures  seizures  Other reaction(s): Other (see comments)  seizures  Other reaction(s): Other (see comments)  seizures  seizures    PAST MEDICAL HISTORY: Past Medical History:  Diagnosis Date   Allergy    Anemia    has taken iron supplements in the past   Arthritis    Bipolar affective disorder (HCC)    no meds currently;has been years since taking meds   Carpal tunnel syndrome on both sides    Gestational diabetes     H/O varicella    Hypertension    Pt states she had pre-eclampsia w last pregnancy    Infection    Yeast inf;not frequent   Migraines    would be rx'd Percacet for relief   Pregnancy induced hypertension    Seizures (HCC) 05/20/2011   unsure what started seizures;Neurology appt on 05/30/12;on Kepra 2000mg  bid;was on Topramax 50mg  tid   Wears glasses    Yeast infection     MEDICATIONS AT HOME: Prior to Admission medications   Medication Sig Start Date End Date Taking? Authorizing Provider  ammonium lactate (AMLACTIN) 12 % cream Apply 1 Application topically as needed for dry skin. 08/23/23  Yes McCaughan, Dia D, DPM  gabapentin (NEURONTIN) 300 MG capsule Take 1 capsule (300 mg total) by mouth 3 (three) times daily. 08/23/23  Yes McCaughan, Dia D, DPM  hydrOXYzine (ATARAX) 10 MG tablet Take 1 tablet (10 mg total) by mouth 3 (three) times daily as needed. 03/04/23  Yes [name], PA-C  ketoconazole (NIZORAL) 2 % shampoo Apply 1 Application topically 2 (two) times a week. X 2 weeks 08/16/23  Yes Hoy Register, MD  lacosamide (VIMPAT) 50 MG TABS tablet Take 1 tablet (50 mg total) by mouth 2 (two) times daily. 11/13/22  Yes Everlena Cooper, Adam R, DO  metFORMIN (GLUCOPHAGE) 1000 MG tablet Take 1 tablet (1,000 mg total) by mouth 2 (two)  times daily with a meal. 10/07/23  Yes Tailey Top M, PA-C  nystatin cream (MYCOSTATIN) Apply 1 Application topically 2 (two) times daily. 03/08/23  Yes [name], PA-C  phenytoin (DILANTIN) 100 MG ER capsule Take 2 capsules (200 mg total) by mouth 2 (two) times daily. 05/14/23  Yes Jaffe, Adam R, DO  promethazine-dextromethorphan (PROMETHAZINE-DM) 6.25-15 MG/5ML syrup Take 5 mLs by mouth at bedtime as needed for cough. 03/04/23  Yes [name], PA-C  tirzepatide [facility]) 2.5 MG/0.5ML Pen Inject 2.5 mg into the skin once a week. 10/07/23  Yes Georgian Co M, PA-C  topiramate (TOPAMAX) 200 MG tablet Take 1 tablet (200 mg total) by mouth  2 (two) times daily. 05/14/23  Yes Jaffe, Adam R, DO  atorvastatin (LIPITOR) 20 MG tablet Take 1 tablet (20 mg total) by mouth daily. 10/07/23   Anders Simmonds, PA-C  furosemide (LASIX) 20 MG tablet Take 1 tablet (20 mg total) by mouth daily. X 5days for swelling 10/07/23   Valecia Beske, Marylene Land M, PA-C    ROS: Neg HEENT Neg resp Neg cardiac Neg GI Neg GU Neg MS Neg psych Neg neuro  Objective:   Vitals:   10/07/23 1543  BP: 135/87  Pulse: 98  SpO2: 97%  Weight: (!) 375 lb 6.4 oz (170.3 kg)   Exam General appearance : Awake, alert, not in any distress. Speech Clear. Not toxic looking HEENT: Atraumatic and Normocephalic Neck: Supple, no JVD. No cervical lymphadenopathy.  Chest: Good air entry bilaterally, CTAB.  No rales/rhonchi/wheezing CVS: S1 S2 regular, no murmurs.  Extremities: B/L Lower Ext shows 1-2+ edema, both legs are warm to touch Neurology: Awake alert, and oriented X 3, CN II-XII intact, Non focal Skin: No Rash  Data Review Lab Results  Component Value Date   HGBA1C 10.5 (A) 10/07/2023   HGBA1C 11.4 (A) 03/16/2023   HGBA1C 9.7 (A) 07/06/2022    Assessment & Plan   1. Long-term current use of injectable noninsulin antidiabetic medication (Primary) - Glucose (CBG) - HgB A1c -discussed that vision changes can occur as glucose starts to get regulated.  Increase water intake - tirzepatide (MOUNJARO) 2.5 MG/0.5ML Pen; Inject 2.5 mg into the skin once a week.  Dispense: 2 mL; Refill: 1  2. Type 2 diabetes mellitus with hyperglycemia, without long-term current use of insulin (HCC) Noncompliance  Stop glipizide. Take 1/2 tab daily of metformin daily for 1 week then increase to 1/2 tab twice daily for 1 week then go to 1 tab twice daily.    Drink 80-100 ounces water   Work at a goal of eliminating sugary drinks, candy, desserts, sweets, refined sugars, processed foods, and white carbohydrates.    Check blood sugars fasting and at bedtime and record -  atorvastatin (LIPITOR) 20 MG tablet; Take 1 tablet (20 mg total) by mouth daily.  Dispense: 90 tablet; Refill: 1 - metFORMIN (GLUCOPHAGE) 1000 MG tablet; Take 1 tablet (1,000 mg total) by mouth 2 (two) times daily with a meal.  Dispense: 180 tablet; Refill: 3 - Comprehensive metabolic panel  3. Weight gain Likely poor diet and metabolic dysfunction complicated by diabetes - TSH  4. Generalized edema Increase activity.  TED stockings recommended - Comprehensive metabolic panel - furosemide (LASIX) 20 MG tablet; Take 1 tablet (20 mg total) by mouth daily. X 5days for swelling  Dispense: 15 tablet; Refill: 0  5. Long term current use of oral hypoglycemic drug - metFORMIN (GLUCOPHAGE) 1000 MG tablet; Take 1 tablet (1,000 mg total) by  mouth 2 (two) times daily with a meal.  Dispense: 180 tablet; Refill: 3    Return in about 4 weeks (around 11/04/2023) for Luke-diabetes.  The patient was given clear instructions to go to ER or return to medical center if symptoms don't improve, worsen or new problems develop. The patient verbalized understanding. The patient was told to call to get lab results if they haven't heard anything in the next week.      Georgian Co, PA-C [facility] [place], [place] [phone]   10/07/2023, 4:37 PM

## 2023-10-07 NOTE — Patient Instructions (Signed)
Take 1/2 tab daily of metformin daily for 1 week then increase to 1/2 tab twice daily for 1 week then go to 1 tab twice daily.    Drink 80-100 ounces water   Work at a goal of eliminating sugary drinks, candy, desserts, sweets, refined sugars, processed foods, and white carbohydrates.    Check blood sugars fasting and at bedtime and record

## 2023-10-08 ENCOUNTER — Telehealth: Payer: Self-pay

## 2023-10-08 LAB — COMPREHENSIVE METABOLIC PANEL
ALT: 21 [IU]/L (ref 0–32)
AST: 23 [IU]/L (ref 0–40)
Albumin: 3.9 g/dL (ref 3.9–4.9)
Alkaline Phosphatase: 102 [IU]/L (ref 44–121)
BUN/Creatinine Ratio: 13 (ref 9–23)
BUN: 11 mg/dL (ref 6–24)
Bilirubin Total: <0.2 mg/dL (ref 0.0–1.2)
CO2: 18 mmol/L — ABNORMAL LOW (ref 20–29)
Calcium: 8.4 mg/dL — ABNORMAL LOW (ref 8.7–10.2)
Chloride: 106 mmol/L (ref 96–106)
Creatinine, Ser: 0.83 mg/dL (ref 0.57–1.00)
Globulin, Total: 3 g/dL (ref 1.5–4.5)
Glucose: 247 mg/dL — ABNORMAL HIGH (ref 70–99)
Potassium: 4.4 mmol/L (ref 3.5–5.2)
Sodium: 140 mmol/L (ref 134–144)
Total Protein: 6.9 g/dL (ref 6.0–8.5)
eGFR: 91 mL/min/{1.73_m2} (ref >59)

## 2023-10-08 LAB — TSH: TSH: 0.914 u[IU]/mL (ref 0.450–4.500)

## 2023-10-08 NOTE — Telephone Encounter (Signed)
-----   Message from Georgian Co sent at 10/08/2023 12:56 PM EST ----- Your kidney, liver, and electrolyte levels are normal.  Your calcium is minimally down-I advise a Calcium/vitamin D over the counter supplement for all women daily.  Thyroid levels are normal. Work at a goal of eliminating sugary drinks, candy, desserts, sweets, refined sugars, processed foods, and white carbohydrates.  Drink 64 to 80 ounces water daily.  Thanks, Georgian Co, PA-C

## 2023-10-08 NOTE — Telephone Encounter (Signed)
Patient viewed results and Doctor comment through [facility]

## 2023-10-10 ENCOUNTER — Other Ambulatory Visit: Payer: Self-pay | Admitting: Family Medicine

## 2023-10-10 DIAGNOSIS — E1169 Type 2 diabetes mellitus with other specified complication: Secondary | ICD-10-CM

## 2023-10-15 ENCOUNTER — Telehealth: Payer: Medicare Other | Admitting: Emergency Medicine

## 2023-10-15 DIAGNOSIS — R8290 Unspecified abnormal findings in urine: Secondary | ICD-10-CM | POA: Diagnosis not present

## 2023-10-15 DIAGNOSIS — R197 Diarrhea, unspecified: Secondary | ICD-10-CM | POA: Diagnosis not present

## 2023-10-15 DIAGNOSIS — T50905A Adverse effect of unspecified drugs, medicaments and biological substances, initial encounter: Secondary | ICD-10-CM

## 2023-10-15 DIAGNOSIS — R110 Nausea: Secondary | ICD-10-CM | POA: Diagnosis not present

## 2023-10-15 NOTE — Progress Notes (Signed)
Virtual Visit Consent   Veyonce Mccoun, you are scheduled for a virtual visit with a [facility] provider today. Just as with appointments in the office, your consent must be obtained to participate. Your consent will be active for this visit and any virtual visit you may have with one of our providers in the next 365 days. If you have a MyChart account, a copy of this consent can be sent to you electronically.  As this is a virtual visit, video technology does not allow for your provider to perform a traditional examination. This may limit your provider's ability to fully assess your condition. If your provider identifies any concerns that need to be evaluated in person or the need to arrange testing (such as labs, EKG, etc.), we will make arrangements to do so. Although advances in technology are sophisticated, we cannot ensure that it will always work on either your end or our end. If the connection with a video visit is poor, the visit may have to be switched to a telephone visit. With either a video or telephone visit, we are not always able to ensure that we have a secure connection.  By engaging in this virtual visit, you consent to the provision of healthcare and authorize for your insurance to be billed (if applicable) for the services provided during this visit. Depending on your insurance coverage, you may receive a charge related to this service.  I need to obtain your verbal consent now. Are you willing to proceed with your visit today? [name] [name] has provided verbal consent on 10/15/2023 for a virtual visit (video or telephone). Roxy Horseman, PA-C  Date: 10/15/2023 11:41 AM  Virtual Visit via Video Note   I, Roxy Horseman, [name]  ([id number], December 09, 1980) on 10/15/23 at 11:30 AM EST by a video-enabled telemedicine application and verified that I am speaking with the correct person using two identifiers.  Location: Patient: Virtual Visit Location Patient:  Home Provider: Virtual Visit Location Provider: Home Office   I discussed the limitations of evaluation and management by telemedicine and the availability of in person appointments. The patient expressed understanding and agreed to proceed.    History of Present Illness: [name] [name] is a 42 y.o. who identifies as a female who was assigned female at birth, and is being seen today for nausea, diarrhea, and dry mouth. States that she just started metformin 3 days ago.  She also states that she has been having foul smell urine.  States that she hasn't been having any dysuria. She denies fevers or chills.  She associates her symptoms with having recently started mounjaro and metformin.  HPI: HPI  Problems:  Patient Active Problem List   Diagnosis Date Noted   Generalized edema 08/23/2023   Diabetes mellitus (HCC) 07/06/2022   Epilepsy (HCC) 05/27/2012   Bipolar affective disorder (HCC) 05/27/2012    Allergies:  Allergies  Allergen Reactions   Levetiracetam    Tramadol Other (See Comments)    Other Reaction(s): Other (see comments), Unknown  seizures  Other reaction(s): Other (see comments)  seizures  Other reaction(s): Other (see comments)  seizures  seizures  seizures  Other reaction(s): Other (see comments)  seizures  seizures  Other reaction(s): Other (see comments)  seizures  Other reaction(s): Other (see comments)  seizures  seizures  Other reaction(s): Other (see comments)  seizures  seizures  Other reaction(s): Other (see comments)  seizures  Other reaction(s): Other (see comments)  seizures  seizures   Medications:  Current Outpatient Medications:    ammonium lactate (AMLACTIN) 12 % cream, Apply 1 Application topically as needed for dry skin., Disp: 385 g, Rfl: 3   atorvastatin (LIPITOR) 20 MG tablet, Take 1 tablet (20 mg total) by mouth daily., Disp: 90 tablet, Rfl: 1   furosemide (LASIX) 20 MG tablet, Take 1 tablet (20 mg total) by mouth daily. X 5days  for swelling, Disp: 15 tablet, Rfl: 0   gabapentin (NEURONTIN) 300 MG capsule, Take 1 capsule (300 mg total) by mouth 3 (three) times daily., Disp: 90 capsule, Rfl: 4   hydrOXYzine (ATARAX) 10 MG tablet, Take 1 tablet (10 mg total) by mouth 3 (three) times daily as needed., Disp: 30 tablet, Rfl: 0   ketoconazole (NIZORAL) 2 % shampoo, Apply 1 Application topically 2 (two) times a week. X 2 weeks, Disp: 120 mL, Rfl: 0   lacosamide (VIMPAT) 50 MG TABS tablet, Take 1 tablet (50 mg total) by mouth 2 (two) times daily., Disp: 60 tablet, Rfl: 11   metFORMIN (GLUCOPHAGE) 1000 MG tablet, Take 1 tablet (1,000 mg total) by mouth 2 (two) times daily with a meal., Disp: 180 tablet, Rfl: 3   nystatin cream (MYCOSTATIN), Apply 1 Application topically 2 (two) times daily., Disp: 60 g, Rfl: 0   phenytoin (DILANTIN) 100 MG ER capsule, Take 2 capsules (200 mg total) by mouth 2 (two) times daily., Disp: 120 capsule, Rfl: 5   promethazine-dextromethorphan (PROMETHAZINE-DM) 6.25-15 MG/5ML syrup, Take 5 mLs by mouth at bedtime as needed for cough., Disp: 118 mL, Rfl: 0   tirzepatide (MOUNJARO) 2.5 MG/0.5ML Pen, Inject 2.5 mg into the skin once a week., Disp: 2 mL, Rfl: 1   topiramate (TOPAMAX) 200 MG tablet, Take 1 tablet (200 mg total) by mouth 2 (two) times daily., Disp: 60 tablet, Rfl: 5  Observations/Objective: Patient's camera was malfunctioning, so I couldn't see the patient. Speech is clear and coherent with logical content.  Patient is alert and oriented at baseline.    Assessment and Plan: 1. Nausea (Primary)  2. Diarrhea, unspecified type  3. Adverse effect of drug, initial encounter  4. Foul smelling urine   Givens onset of symptoms was after starting her new medications, I've advised her to try halving her metformin for the next 2 weeks and then going back up to the 1000mg  BID.  Her symptoms could also be related to the [facility], but will try modification to metformin first.  If symptoms don't  improve, she can follow up with her regular provider.  Follow Up Instructions: I discussed the assessment and treatment plan with the patient. The patient was provided an opportunity to ask questions and all were answered. The patient agreed with the plan and demonstrated an understanding of the instructions.  A copy of instructions were sent to the patient via MyChart unless otherwise noted below.     The patient was advised to call back or seek an in-person evaluation if the symptoms worsen or if the condition fails to improve as anticipated.    Roxy Horseman, PA-C

## 2023-10-15 NOTE — Patient Instructions (Signed)
[name], thank you for joining Roxy Horseman, PA-C for today's virtual visit.  While this provider is not your primary care provider (PCP), if your PCP is located in our provider database this encounter information will be shared with them immediately following your visit.   A [facility] MyChart account gives you access to today's visit and all your visits, tests, and labs performed at [facility] " click here if you don't have a [facility] MyChart account or go to [email or web address]  Consent: (Patient) [name] [name] provided verbal consent for this virtual visit at the beginning of the encounter.  Current Medications:  Current Outpatient Medications:    ammonium lactate (AMLACTIN) 12 % cream, Apply 1 Application topically as needed for dry skin., Disp: 385 g, Rfl: 3   atorvastatin (LIPITOR) 20 MG tablet, Take 1 tablet (20 mg total) by mouth daily., Disp: 90 tablet, Rfl: 1   furosemide (LASIX) 20 MG tablet, Take 1 tablet (20 mg total) by mouth daily. X 5days for swelling, Disp: 15 tablet, Rfl: 0   gabapentin (NEURONTIN) 300 MG capsule, Take 1 capsule (300 mg total) by mouth 3 (three) times daily., Disp: 90 capsule, Rfl: 4   hydrOXYzine (ATARAX) 10 MG tablet, Take 1 tablet (10 mg total) by mouth 3 (three) times daily as needed., Disp: 30 tablet, Rfl: 0   ketoconazole (NIZORAL) 2 % shampoo, Apply 1 Application topically 2 (two) times a week. X 2 weeks, Disp: 120 mL, Rfl: 0   lacosamide (VIMPAT) 50 MG TABS tablet, Take 1 tablet (50 mg total) by mouth 2 (two) times daily., Disp: 60 tablet, Rfl: 11   metFORMIN (GLUCOPHAGE) 1000 MG tablet, Take 1 tablet (1,000 mg total) by mouth 2 (two) times daily with a meal., Disp: 180 tablet, Rfl: 3   nystatin cream (MYCOSTATIN), Apply 1 Application topically 2 (two) times daily., Disp: 60 g, Rfl: 0   phenytoin (DILANTIN) 100 MG ER capsule, Take 2 capsules (200 mg total) by mouth 2 (two) times daily., Disp: 120 capsule, Rfl: 5    promethazine-dextromethorphan (PROMETHAZINE-DM) 6.25-15 MG/5ML syrup, Take 5 mLs by mouth at bedtime as needed for cough., Disp: 118 mL, Rfl: 0   tirzepatide (MOUNJARO) 2.5 MG/0.5ML Pen, Inject 2.5 mg into the skin once a week., Disp: 2 mL, Rfl: 1   topiramate (TOPAMAX) 200 MG tablet, Take 1 tablet (200 mg total) by mouth 2 (two) times daily., Disp: 60 tablet, Rfl: 5   Medications ordered in this encounter:  No orders of the defined types were placed in this encounter.    *If you need refills on other medications prior to your next appointment, please contact your pharmacy*  Follow-Up: Call back or seek an in-person evaluation if the symptoms worsen or if the condition fails to improve as anticipated.  [facility] [phone]  Other Instructions Try taking 1/2 tablet of the metformin morning and evening for the next two weeks to reduce side effects.  Then increase back to a full tablet.  Please follow-up with your provider if not improving.   If you have been instructed to have an in-person evaluation today at a local Urgent Care facility, please use the link below. It will take you to a list of all of our available [facility], including address, phone number and hours of operation. Please do not delay care.  [facility]  If you or a family member do not have a primary care provider, use the link below to  schedule a visit and establish care. When you choose a [facility] primary care physician or advanced practice provider, you gain a long-term partner in health. Find a Primary Care Provider  Learn more about [facility]'s in-office and virtual care options: [facility] - Get Care Now

## 2023-10-18 ENCOUNTER — Ambulatory Visit: Payer: Self-pay | Admitting: Licensed Clinical Social Worker

## 2023-10-18 NOTE — Patient Instructions (Signed)
Visit Information  Thank you for taking time to visit with me today. Please don't hesitate to contact me if I can be of assistance to you.   Following are the goals we discussed today:   Goals Addressed   None     Our next appointment is by telephone on 1/6 at 12:30 PM  Please call the care guide team at [phone] if you need to cancel or reschedule your appointment.   If you are experiencing a Mental Health or Behavioral Health Crisis or need someone to talk to, please call the Suicide and Crisis Lifeline: 988 call 911   Patient verbalizes understanding of instructions and care plan provided today and agrees to view in MyChart. Active MyChart status and patient understanding of how to access instructions and care plan via MyChart confirmed with patient.     Jenel Lucks, MSW, LCSW [facility] Care Management [place]  [facility] [email or web address] Phone [phone] 10:59 AM

## 2023-10-18 NOTE — Patient Outreach (Signed)
  Care Coordination   Initial Visit Note   10/18/2023 Name: Adalida Diego MRN: [id number] DOB: 1981-07-15  Laurna Plaza is a 42 y.o. year old female who sees Hoy Register, MD for primary care. I spoke with  [name] by phone today.  What matters to the patients health and wellness today?  LCSW introduced self and explained care coordination services. Patient is not feeling well and requested to r/s appt      SDOH assessments and interventions completed:  No     Care Coordination Interventions:  Yes, provided  Interventions Today    Flowsheet Row Most Recent Value  Chronic Disease   Chronic disease during today's visit Diabetes, Other  [Bipolar Affective Disorder]  General Interventions   General Interventions Discussed/Reviewed General Interventions Discussed       Follow up plan: Follow up call scheduled for 10/25/23    Encounter Outcome:  Patient Visit Completed   Jenel Lucks, MSW, LCSW [facility] Care Management [facility]  [facility] [email or web address] Phone [phone] 10:58 AM

## 2023-10-25 ENCOUNTER — Ambulatory Visit: Payer: Self-pay | Admitting: Licensed Clinical Social Worker

## 2023-10-26 NOTE — Patient Outreach (Signed)
  Care Coordination   Initial Visit Note   10/25/2023 Name: [name] [name] MRN: [id number] DOB: [birth year]  [name] [name] is a 43 y.o. year old female who sees Delbert Clam, MD for primary care. I spoke with  [name] [name] by phone today.  What matters to the patients health and wellness today?  Stress Management and Supportive Resources    Goals Addressed             This Visit's Progress    Obtain Supportive Resources-Stress Management   On track    Activities and task to complete in order to accomplish goals.   Keep all upcoming appointments discussed today Continue with compliance of taking medication prescribed by Doctor Implement healthy coping skills discussed to assist with management of symptoms         SDOH assessments and interventions completed:  No     Care Coordination Interventions:  Yes, provided  Interventions Today    Flowsheet Row Most Recent Value  Chronic Disease   Chronic disease during today's visit Diabetes, Other  [Epilepsy, Bipolar Affective Disorder]  General Interventions   General Interventions Discussed/Reviewed General Interventions Discussed, Community Resources, Doctor Visits  Doctor Visits Discussed/Reviewed Doctor Visits Discussed  Mental Health Interventions   Mental Health Discussed/Reviewed Mental Health Discussed, Coping Strategies, Anxiety, Depression  Nutrition Interventions   Nutrition Discussed/Reviewed Nutrition Discussed  Pharmacy Interventions   Pharmacy Dicussed/Reviewed Pharmacy Topics Discussed, Medication Adherence  Safety Interventions   Safety Discussed/Reviewed Safety Discussed       Follow up plan: Follow up call scheduled for 1-2 weeks    Encounter Outcome:  Patient Visit Completed   Rolin Kerns, MSW, LCSW [facility] Care Management [place]  [facility] [email or web address] Phone [phone] 5:08 PM

## 2023-10-26 NOTE — Patient Instructions (Signed)
 Visit Information  Thank you for taking time to visit with me today. Please don't hesitate to contact me if I can be of assistance to you.   Following are the goals we discussed today:   Goals Addressed             This Visit's Progress    Obtain Supportive Resources-Stress Management   On track    Activities and task to complete in order to accomplish goals.   Keep all upcoming appointments discussed today Continue with compliance of taking medication prescribed by Doctor Implement healthy coping skills discussed to assist with management of symptoms         Our next appointment is by telephone on 01/27 at 11:30 AM  Please call the care guide team at [phone] if you need to cancel or reschedule your appointment.   If you are experiencing a Mental Health or Behavioral Health Crisis or need someone to talk to, please call the Suicide and Crisis Lifeline: 988 call 911   Patient verbalizes understanding of instructions and care plan provided today and agrees to view in MyChart. Active MyChart status and patient understanding of how to access instructions and care plan via MyChart confirmed with patient.     [name] [name], MSW, LCSW [facility] Care Management [place]  [facility] [email or web address] Phone [phone] 5:10 PM

## 2023-11-03 ENCOUNTER — Telehealth: Payer: Medicare Other | Admitting: Physician Assistant

## 2023-11-03 ENCOUNTER — Encounter: Payer: Self-pay | Admitting: Physician Assistant

## 2023-11-03 DIAGNOSIS — R6889 Other general symptoms and signs: Secondary | ICD-10-CM | POA: Diagnosis not present

## 2023-11-03 MED ORDER — OSELTAMIVIR PHOSPHATE 75 MG PO CAPS: 75.0000 mg | ORAL_CAPSULE | Freq: Two times a day (BID) | ORAL | 0 refills | Status: DC

## 2023-11-03 MED ORDER — COVID-19 AT-HOME TEST VI KIT: PACK | 0 refills | Status: AC

## 2023-11-03 MED ORDER — PROMETHAZINE-DM 6.25-15 MG/5ML PO SYRP: 5.0000 mL | ORAL_SOLUTION | Freq: Four times a day (QID) | ORAL | 0 refills | Status: DC | PRN

## 2023-11-03 MED ORDER — ALBUTEROL SULFATE HFA 108 (90 BASE) MCG/ACT IN AERS: 2.0000 | INHALATION_SPRAY | Freq: Four times a day (QID) | RESPIRATORY_TRACT | 0 refills | Status: DC | PRN

## 2023-11-03 NOTE — Patient Instructions (Signed)
 [name] [name], thank you for joining [name], PA-C for today's virtual visit.  While this provider is not your primary care provider (PCP), if your PCP is located in our provider database this encounter information will be shared with them immediately following your visit.   A [facility] MyChart account gives you access to today's visit and all your visits, tests, and labs performed at [facility] " click here if you don't have a [facility] MyChart account or go to [email or web address]  Consent: (Patient) [name] [name] provided verbal consent for this virtual visit at the beginning of the encounter.  Current Medications:  Current Outpatient Medications:    ammonium lactate  (AMLACTIN) 12 % cream, Apply 1 Application topically as needed for dry skin., Disp: 385 g, Rfl: 3   atorvastatin  (LIPITOR) 20 MG tablet, Take 1 tablet (20 mg total) by mouth daily., Disp: 90 tablet, Rfl: 1   furosemide  (LASIX ) 20 MG tablet, Take 1 tablet (20 mg total) by mouth daily. X 5days for swelling, Disp: 15 tablet, Rfl: 0   gabapentin  (NEURONTIN ) 300 MG capsule, Take 1 capsule (300 mg total) by mouth 3 (three) times daily., Disp: 90 capsule, Rfl: 4   hydrOXYzine  (ATARAX ) 10 MG tablet, Take 1 tablet (10 mg total) by mouth 3 (three) times daily as needed., Disp: 30 tablet, Rfl: 0   ketoconazole  (NIZORAL ) 2 % shampoo, Apply 1 Application topically 2 (two) times a week. X 2 weeks, Disp: 120 mL, Rfl: 0   lacosamide  (VIMPAT ) 50 MG TABS tablet, Take 1 tablet (50 mg total) by mouth 2 (two) times daily., Disp: 60 tablet, Rfl: 11   metFORMIN  (GLUCOPHAGE ) 1000 MG tablet, Take 1 tablet (1,000 mg total) by mouth 2 (two) times daily with a meal., Disp: 180 tablet, Rfl: 3   nystatin  cream (MYCOSTATIN ), Apply 1 Application topically 2 (two) times daily., Disp: 60 g, Rfl: 0   phenytoin  (DILANTIN ) 100 MG ER capsule, Take 2 capsules (200 mg total) by mouth 2 (two) times daily., Disp: 120 capsule, Rfl: 5    tirzepatide (MOUNJARO) 2.5 MG/0.5ML Pen, Inject 2.5 mg into the skin once a week., Disp: 2 mL, Rfl: 1   topiramate  (TOPAMAX ) 200 MG tablet, Take 1 tablet (200 mg total) by mouth 2 (two) times daily., Disp: 60 tablet, Rfl: 5   Medications ordered in this encounter:  No orders of the defined types were placed in this encounter.    *If you need refills on other medications prior to your next appointment, please contact your pharmacy*  Follow-Up: Call back or seek an in-person evaluation if the symptoms worsen or if the condition fails to improve as anticipated.  [facility] [phone]  Other Instructions Take the COVID test as directed as a precaution. Message me back directly if this is positive.  Please keep well-hydrated and try to get plenty of rest. If you have a humidifier, place it in the bedroom and run it at night. Start a saline nasal rinse for nasal congestion. You can consider use of a nasal steroid spray like Flonase or Nasacort OTC. You can alternate between Tylenol  and Ibuprofen  if needed for fever, body aches, headache and/or throat pain. Salt water-gargles and chloraseptic spray can be very beneficial for sore throat. Mucinex-DM for congestion or cough. Please take all prescribed medications as directed.  Remain out of work until [employer] for 24 hours without a fever-reducing medication, and you are feeling better.  You should mask until symptoms are resolved.  If  anything worsens despite treatment, you need to be evaluated in-person. Please do not delay care.  Influenza, Adult Influenza is also called "the flu." It is an infection in the lungs, nose, and throat (respiratory tract). It spreads easily from person to person (is contagious). The flu causes symptoms that are like a cold, along with high fever and body aches. What are the causes? This condition is caused by the influenza virus. You can get the virus by: Breathing in droplets that are in the  air after a person infected with the flu coughed or sneezed. Touching something that has the virus on it and then touching your mouth, nose, or eyes. What increases the risk? Certain things may make you more likely to get the flu. These include: Not washing your hands often. Having close contact with many people during cold and flu season. Touching your mouth, eyes, or nose without first washing your hands. Not getting a flu shot every year. You may have a higher risk for the flu, and serious problems, such as a lung infection (pneumonia), if you: Are older than 65. Are pregnant. Have a weakened disease-fighting system (immune system) because of a disease or because you are taking certain medicines. Have a long-term (chronic) condition, such as: Heart, kidney, or lung disease. Diabetes. Asthma. Have a liver disorder. Are very overweight (morbidly obese). Have anemia. What are the signs or symptoms? Symptoms usually begin suddenly and last 4-14 days. They may include: Fever and chills. Headaches, body aches, or muscle aches. Sore throat. Cough. Runny or stuffy (congested) nose. Feeling discomfort in your chest. Not wanting to eat as much as normal. Feeling weak or tired. Feeling dizzy. Feeling sick to your stomach or throwing up. How is this treated? If the flu is found early, you can be treated with antiviral medicine. This can help to reduce how bad the illness is and how long it lasts. This may be given by mouth or through an IV tube. Taking care of yourself at home can help your symptoms get better. Your doctor may want you to: Take over-the-counter medicines. Drink plenty of fluids. The flu often goes away on its own. If you have very bad symptoms or other problems, you may be treated in a hospital. Follow these instructions at home:     Activity Rest as needed. Get plenty of sleep. Stay home from work or school as told by your doctor. Do not leave home until you do  not have a fever for 24 hours without taking medicine. Leave home only to go to your doctor. Eating and drinking Take an ORS (oral rehydration solution). This is a drink that is sold at pharmacies and stores. Drink enough fluid to keep your pee pale yellow. Drink clear fluids in small amounts as you are able. Clear fluids include: Water. Ice chips. Fruit juice mixed with water. Low-calorie sports drinks. Eat bland foods that are easy to digest. Eat small amounts as you are able. These foods include: Bananas. Applesauce. Rice. Lean meats. Toast. Crackers. Do not eat or drink: Fluids that have a lot of sugar or caffeine . Alcohol. Spicy or fatty foods. General instructions Take over-the-counter and prescription medicines only as told by your doctor. Use a cool mist humidifier to add moisture to the air in your home. This can make it easier for you to breathe. When using a cool mist humidifier, clean it daily. Empty water and replace with clean water. Cover your mouth and nose when you cough or sneeze.  Wash your hands with soap and water often and for at least 20 seconds. This is also important after you cough or sneeze. If you cannot use soap and water, use alcohol-based hand sanitizer. Keep all follow-up visits. How is this prevented?  Get a flu shot every year. You may get the flu shot in late summer, fall, or winter. Ask your doctor when you should get your flu shot. Avoid contact with people who are sick during fall and winter. This is cold and flu season. Contact a doctor if: You get new symptoms. You have: Chest pain. Watery poop (diarrhea). A fever. Your cough gets worse. You start to have more mucus. You feel sick to your stomach. You throw up. Get help right away if you: Have shortness of breath. Have trouble breathing. Have skin or nails that turn a bluish color. Have very bad pain or stiffness in your neck. Get a sudden headache. Get sudden pain in your face or  ear. Cannot eat or drink without throwing up. These symptoms may represent a serious problem that is an emergency. Get medical help right away. Call your local emergency services (911 in the [place] Do not wait to see if the symptoms will go away. Do not drive yourself to the hospital. Summary Influenza is also called "the flu." It is an infection in the lungs, nose, and throat. It spreads easily from person to person. Take over-the-counter and prescription medicines only as told by your doctor. Getting a flu shot every year is the best way to not get the flu. This information is not intended to replace advice given to you by your health care provider. Make sure you discuss any questions you have with your health care provider. Document Revised: 05/24/2020 Document Reviewed: 05/24/2020 Elsevier Patient Education  2023 Elsevier Inc.      If you have been instructed to have an in-person evaluation today at a local Urgent Care facility, please use the link below. It will take you to a list of all of our available [facility], including address, phone number and hours of operation. Please do not delay care.  [facility]  If you or a family member do not have a primary care provider, use the link below to schedule a visit and establish care. When you choose a [facility] primary care physician or advanced practice provider, you gain a long-term partner in health. Find a Primary Care Provider  Learn more about [facility]'s in-office and virtual care options: [facility] - Get Care Now

## 2023-11-03 NOTE — Progress Notes (Signed)
 Virtual Visit Consent   [name] [name], you are scheduled for a virtual visit with a [facility] provider today. Just as with appointments in the office, your consent must be obtained to participate. Your consent will be active for this visit and any virtual visit you may have with one of our providers in the next 365 days. If you have a MyChart account, a copy of this consent can be sent to you electronically.  As this is a virtual visit, video technology does not allow for your provider to perform a traditional examination. This may limit your provider's ability to fully assess your condition. If your provider identifies any concerns that need to be evaluated in person or the need to arrange testing (such as labs, EKG, etc.), we will make arrangements to do so. Although advances in technology are sophisticated, we cannot ensure that it will always work on either your end or our end. If the connection with a video visit is poor, the visit may have to be switched to a telephone visit. With either a video or telephone visit, we are not always able to ensure that we have a secure connection.  By engaging in this virtual visit, you consent to the provision of healthcare and authorize for your insurance to be billed (if applicable) for the services provided during this visit. Depending on your insurance coverage, you may receive a charge related to this service.  I need to obtain your verbal consent now. Are you willing to proceed with your visit today? Lena Stricklen has provided verbal consent on 11/03/2023 for a virtual visit (video or telephone). [name] [name], [place]  Date: 11/03/2023 9:24 AM  Virtual Visit via Video Note   I, [name] [name], [name]  ([id number], Nov 21, 1980) on 11/03/23 at  9:15 AM EST by a video-enabled telemedicine application and verified that I am speaking with the correct person using two identifiers.  Location: Patient: Virtual Visit Location  Patient: Home Provider: Virtual Visit Location Provider: Home Office   I discussed the limitations of evaluation and management by telemedicine and the availability of in person appointments. The patient expressed understanding and agreed to proceed.    History of Present Illness: [name] [name] is a 43 y.o. who identifies as a female who was assigned female at birth, and is being seen today for URI symptoms starting abruptly yesterday with fatigue, hoarseness, sore throat, nausea, congestion and cough. Notes body aches. Notes chest wall tenderness due to the cough. Denies fever. Denies known sick contact.  Denies SOB outside of a coughing spell. Has not tested for COVID.   OTC -- Vicks Vapor Rub, Cough drops, Mucinex  HPI: HPI  Problems:  Patient Active Problem List   Diagnosis Date Noted   Generalized edema 08/23/2023   Diabetes mellitus (HCC) 07/06/2022   Epilepsy (HCC) 05/27/2012   Bipolar affective disorder (HCC) 05/27/2012    Allergies:  Allergies  Allergen Reactions   Levetiracetam     Tramadol Other (See Comments)    Other Reaction(s): Other (see comments), Unknown  seizures  Other reaction(s): Other (see comments)  seizures  Other reaction(s): Other (see comments)  seizures  seizures  seizures  Other reaction(s): Other (see comments)  seizures  seizures  Other reaction(s): Other (see comments)  seizures  Other reaction(s): Other (see comments)  seizures  seizures  Other reaction(s): Other (see comments)  seizures  seizures  Other reaction(s): Other (see comments)  seizures  Other reaction(s): Other (see comments)  seizures  seizures   Medications:  Current Outpatient Medications:    ammonium lactate  (AMLACTIN) 12 % cream, Apply 1 Application topically as needed for dry skin., Disp: 385 g, Rfl: 3   atorvastatin  (LIPITOR) 20 MG tablet, Take 1 tablet (20 mg total) by mouth daily., Disp: 90 tablet, Rfl: 1   furosemide  (LASIX ) 20 MG tablet, Take 1 tablet  (20 mg total) by mouth daily. X 5days for swelling, Disp: 15 tablet, Rfl: 0   gabapentin  (NEURONTIN ) 300 MG capsule, Take 1 capsule (300 mg total) by mouth 3 (three) times daily., Disp: 90 capsule, Rfl: 4   hydrOXYzine  (ATARAX ) 10 MG tablet, Take 1 tablet (10 mg total) by mouth 3 (three) times daily as needed., Disp: 30 tablet, Rfl: 0   ketoconazole  (NIZORAL ) 2 % shampoo, Apply 1 Application topically 2 (two) times a week. X 2 weeks, Disp: 120 mL, Rfl: 0   lacosamide  (VIMPAT ) 50 MG TABS tablet, Take 1 tablet (50 mg total) by mouth 2 (two) times daily., Disp: 60 tablet, Rfl: 11   metFORMIN  (GLUCOPHAGE ) 1000 MG tablet, Take 1 tablet (1,000 mg total) by mouth 2 (two) times daily with a meal., Disp: 180 tablet, Rfl: 3   nystatin  cream (MYCOSTATIN ), Apply 1 Application topically 2 (two) times daily., Disp: 60 g, Rfl: 0   phenytoin  (DILANTIN ) 100 MG ER capsule, Take 2 capsules (200 mg total) by mouth 2 (two) times daily., Disp: 120 capsule, Rfl: 5   tirzepatide (MOUNJARO) 2.5 MG/0.5ML Pen, Inject 2.5 mg into the skin once a week., Disp: 2 mL, Rfl: 1   topiramate  (TOPAMAX ) 200 MG tablet, Take 1 tablet (200 mg total) by mouth 2 (two) times daily., Disp: 60 tablet, Rfl: 5  Observations/Objective: Patient is well-developed, well-nourished in no acute distress.  Resting comfortably at home.  Head is normocephalic, atraumatic.  No labored breathing. Speech is clear and coherent with logical content.  Patient is alert and oriented at baseline.   Assessment and Plan: 1. Flu-like symptoms (Primary)  Will have her COVID test as a precaution. However, giving Classic influenza symptoms will treat as influenza until testing shows otherwise. Supportive measures, OTC medications and Vitamin recommendations reviewed. Will start Tamiflu  per orders. Promethazine -DM and albuterol  per orders. Quarantine reviewed with patient.    Follow Up Instructions: I discussed the assessment and treatment plan with the patient.  The patient was provided an opportunity to ask questions and all were answered. The patient agreed with the plan and demonstrated an understanding of the instructions.  A copy of instructions were sent to the patient via MyChart unless otherwise noted below.   The patient was advised to call back or seek an in-person evaluation if the symptoms worsen or if the condition fails to improve as anticipated.    [name] Maillard, PA-C

## 2023-11-04 ENCOUNTER — Telehealth: Payer: Medicare Other

## 2023-11-04 ENCOUNTER — Telehealth: Payer: Medicare Other | Admitting: Physician Assistant

## 2023-11-04 DIAGNOSIS — R110 Nausea: Secondary | ICD-10-CM | POA: Diagnosis not present

## 2023-11-04 DIAGNOSIS — J020 Streptococcal pharyngitis: Secondary | ICD-10-CM

## 2023-11-04 MED ORDER — AMOXICILLIN 500 MG PO CAPS: 500.0000 mg | ORAL_CAPSULE | Freq: Two times a day (BID) | ORAL | 0 refills | Status: AC

## 2023-11-04 MED ORDER — ONDANSETRON 4 MG PO TBDP: 4.0000 mg | ORAL_TABLET | Freq: Three times a day (TID) | ORAL | 0 refills | Status: AC | PRN

## 2023-11-04 NOTE — Progress Notes (Signed)
Virtual Visit Consent   [name] [name], you are scheduled for a virtual visit with a [facility] provider today. Just as with appointments in the office, your consent must be obtained to participate. Your consent will be active for this visit and any virtual visit you may have with one of our providers in the next 365 days. If you have a MyChart account, a copy of this consent can be sent to you electronically.  As this is a virtual visit, video technology does not allow for your provider to perform a traditional examination. This may limit your provider's ability to fully assess your condition. If your provider identifies any concerns that need to be evaluated in person or the need to arrange testing (such as labs, EKG, etc.), we will make arrangements to do so. Although advances in technology are sophisticated, we cannot ensure that it will always work on either your end or our end. If the connection with a video visit is poor, the visit may have to be switched to a telephone visit. With either a video or telephone visit, we are not always able to ensure that we have a secure connection.  By engaging in this virtual visit, you consent to the provision of healthcare and authorize for your insurance to be billed (if applicable) for the services provided during this visit. Depending on your insurance coverage, you may receive a charge related to this service.  I need to obtain your verbal consent now. Are you willing to proceed with your visit today? Pasha Agers has provided verbal consent on 11/04/2023 for a virtual visit (video or telephone). [name], PA-C  Date: 11/04/2023 10:10 AM  Virtual Visit via Video Note   I, [name], [name]  ([id number], 09-18-1981) on 11/04/23 at 10:15 AM EST by a video-enabled telemedicine application and verified that I am speaking with the correct person using two identifiers.  Location: Patient: Virtual Visit Location  Patient: Home Provider: Virtual Visit Location Provider: Home Office   I discussed the limitations of evaluation and management by telemedicine and the availability of in person appointments. The patient expressed understanding and agreed to proceed.    History of Present Illness: [name] [name] is a 43 y.o. who identifies as a female who was assigned female at birth, and is being seen today for URI symptoms.  HPI: URI  This is a new problem. The current episode started in the past 7 days. The problem has been gradually worsening. There has been no fever. Associated symptoms include chest pain (worse at night with chest tightness when she lays down), congestion, coughing, ear pain, headaches, nausea (from drainage), rhinorrhea, a sore throat and wheezing. Pertinent negatives include no diarrhea, plugged ear sensation, sinus pain, swollen glands or vomiting. Associated symptoms comments: Hoarse voice, nasal passageways burning, does have hot - cold flashes, fatigue. Treatments tried: vicks vapor rub, mucinex. The treatment provided no relief.    Seen yesterday and prescribed Tamiflu for possible Influenza. Also prescribed Albuterol and Promethazine DM for cough and wheezing.   Problems:  Patient Active Problem List   Diagnosis Date Noted   Generalized edema 08/23/2023   Diabetes mellitus (HCC) 07/06/2022   Epilepsy (HCC) 05/27/2012   Bipolar affective disorder (HCC) 05/27/2012    Allergies:  Allergies  Allergen Reactions   Levetiracetam    Tramadol Other (See Comments)    Other Reaction(s): Other (see comments), Unknown  seizures  Other reaction(s): Other (see comments)  seizures  Other  reaction(s): Other (see comments)  seizures  seizures  seizures  Other reaction(s): Other (see comments)  seizures  seizures  Other reaction(s): Other (see comments)  seizures  Other reaction(s): Other (see comments)  seizures  seizures  Other reaction(s): Other (see comments)   seizures  seizures  Other reaction(s): Other (see comments)  seizures  Other reaction(s): Other (see comments)  seizures  seizures   Medications:  Current Outpatient Medications:    albuterol (VENTOLIN HFA) 108 (90 Base) MCG/ACT inhaler, Inhale 2 puffs into the lungs every 6 (six) hours as needed for wheezing or shortness of breath., Disp: 8 g, Rfl: 0   ammonium lactate (AMLACTIN) 12 % cream, Apply 1 Application topically as needed for dry skin., Disp: 385 g, Rfl: 3   atorvastatin (LIPITOR) 20 MG tablet, Take 1 tablet (20 mg total) by mouth daily., Disp: 90 tablet, Rfl: 1   COVID-19 At-Home Test KIT, Use to test for COVID., Disp: 1 kit, Rfl: 0   furosemide (LASIX) 20 MG tablet, Take 1 tablet (20 mg total) by mouth daily. X 5days for swelling, Disp: 15 tablet, Rfl: 0   gabapentin (NEURONTIN) 300 MG capsule, Take 1 capsule (300 mg total) by mouth 3 (three) times daily., Disp: 90 capsule, Rfl: 4   hydrOXYzine (ATARAX) 10 MG tablet, Take 1 tablet (10 mg total) by mouth 3 (three) times daily as needed., Disp: 30 tablet, Rfl: 0   ketoconazole (NIZORAL) 2 % shampoo, Apply 1 Application topically 2 (two) times a week. X 2 weeks, Disp: 120 mL, Rfl: 0   lacosamide (VIMPAT) 50 MG TABS tablet, Take 1 tablet (50 mg total) by mouth 2 (two) times daily., Disp: 60 tablet, Rfl: 11   metFORMIN (GLUCOPHAGE) 1000 MG tablet, Take 1 tablet (1,000 mg total) by mouth 2 (two) times daily with a meal., Disp: 180 tablet, Rfl: 3   nystatin cream (MYCOSTATIN), Apply 1 Application topically 2 (two) times daily., Disp: 60 g, Rfl: 0   oseltamivir (TAMIFLU) 75 MG capsule, Take 1 capsule (75 mg total) by mouth 2 (two) times daily., Disp: 10 capsule, Rfl: 0   phenytoin (DILANTIN) 100 MG ER capsule, Take 2 capsules (200 mg total) by mouth 2 (two) times daily., Disp: 120 capsule, Rfl: 5   promethazine-dextromethorphan (PROMETHAZINE-DM) 6.25-15 MG/5ML syrup, Take 5 mLs by mouth 4 (four) times daily as needed for cough., Disp: 118  mL, Rfl: 0   tirzepatide (MOUNJARO) 2.5 MG/0.5ML Pen, Inject 2.5 mg into the skin once a week., Disp: 2 mL, Rfl: 1   topiramate (TOPAMAX) 200 MG tablet, Take 1 tablet (200 mg total) by mouth 2 (two) times daily., Disp: 60 tablet, Rfl: 5  Observations/Objective: Patient is well-developed, well-nourished in no acute distress.  Resting comfortably at home.  Head is normocephalic, atraumatic.  No labored breathing.  Speech is clear and coherent with logical content.  Patient is alert and oriented at baseline.    Assessment and Plan: There are no diagnoses linked to this encounter. - Possible secondary infection with Strep over flu-like symptoms - Start Tamiflu ASAP - Add Amoxicillin - Continue Albuterol, Promethazine DM  - Add Zofran for nausea - Push fluids - Rest - Work note provided  Follow Up Instructions: I discussed the assessment and treatment plan with the patient. The patient was provided an opportunity to ask questions and all were answered. The patient agreed with the plan and demonstrated an understanding of the instructions.  A copy of instructions were sent to the patient via MyChart unless  otherwise noted below.    The patient was advised to call back or seek an in-person evaluation if the symptoms worsen or if the condition fails to improve as anticipated.    [name], PA-C

## 2023-11-04 NOTE — Patient Instructions (Signed)
[name] [name], thank you for joining [name] Loveless, PA-C for today's virtual visit.  While this provider is not your primary care provider (PCP), if your PCP is located in our provider database this encounter information will be shared with them immediately following your visit.   A [facility] MyChart account gives you access to today's visit and all your visits, tests, and labs performed at [facility] " click here if you don't have a [facility] MyChart account or go to [email or web address]  Consent: (Patient) [name] [name] provided verbal consent for this virtual visit at the beginning of the encounter.  Current Medications:  Current Outpatient Medications:    amoxicillin (AMOXIL) 500 MG capsule, Take 1 capsule (500 mg total) by mouth 2 (two) times daily for 10 days., Disp: 20 capsule, Rfl: 0   ondansetron (ZOFRAN-ODT) 4 MG disintegrating tablet, Take 1 tablet (4 mg total) by mouth every 8 (eight) hours as needed., Disp: 20 tablet, Rfl: 0   albuterol (VENTOLIN HFA) 108 (90 Base) MCG/ACT inhaler, Inhale 2 puffs into the lungs every 6 (six) hours as needed for wheezing or shortness of breath., Disp: 8 g, Rfl: 0   ammonium lactate (AMLACTIN) 12 % cream, Apply 1 Application topically as needed for dry skin., Disp: 385 g, Rfl: 3   atorvastatin (LIPITOR) 20 MG tablet, Take 1 tablet (20 mg total) by mouth daily., Disp: 90 tablet, Rfl: 1   COVID-19 At-Home Test KIT, Use to test for COVID., Disp: 1 kit, Rfl: 0   furosemide (LASIX) 20 MG tablet, Take 1 tablet (20 mg total) by mouth daily. X 5days for swelling, Disp: 15 tablet, Rfl: 0   gabapentin (NEURONTIN) 300 MG capsule, Take 1 capsule (300 mg total) by mouth 3 (three) times daily., Disp: 90 capsule, Rfl: 4   hydrOXYzine (ATARAX) 10 MG tablet, Take 1 tablet (10 mg total) by mouth 3 (three) times daily as needed., Disp: 30 tablet, Rfl: 0   ketoconazole (NIZORAL) 2 % shampoo, Apply 1 Application topically 2 (two) times a week.  X 2 weeks, Disp: 120 mL, Rfl: 0   lacosamide (VIMPAT) 50 MG TABS tablet, Take 1 tablet (50 mg total) by mouth 2 (two) times daily., Disp: 60 tablet, Rfl: 11   metFORMIN (GLUCOPHAGE) 1000 MG tablet, Take 1 tablet (1,000 mg total) by mouth 2 (two) times daily with a meal., Disp: 180 tablet, Rfl: 3   nystatin cream (MYCOSTATIN), Apply 1 Application topically 2 (two) times daily., Disp: 60 g, Rfl: 0   oseltamivir (TAMIFLU) 75 MG capsule, Take 1 capsule (75 mg total) by mouth 2 (two) times daily., Disp: 10 capsule, Rfl: 0   phenytoin (DILANTIN) 100 MG ER capsule, Take 2 capsules (200 mg total) by mouth 2 (two) times daily., Disp: 120 capsule, Rfl: 5   promethazine-dextromethorphan (PROMETHAZINE-DM) 6.25-15 MG/5ML syrup, Take 5 mLs by mouth 4 (four) times daily as needed for cough., Disp: 118 mL, Rfl: 0   tirzepatide (MOUNJARO) 2.5 MG/0.5ML Pen, Inject 2.5 mg into the skin once a week., Disp: 2 mL, Rfl: 1   topiramate (TOPAMAX) 200 MG tablet, Take 1 tablet (200 mg total) by mouth 2 (two) times daily., Disp: 60 tablet, Rfl: 5   Medications ordered in this encounter:  Meds ordered this encounter  Medications   amoxicillin (AMOXIL) 500 MG capsule    Sig: Take 1 capsule (500 mg total) by mouth 2 (two) times daily for 10 days.    Dispense:  20 capsule    Refill:  0  Supervising Provider:   Merrilee Jansky [[id number]]   ondansetron (ZOFRAN-ODT) 4 MG disintegrating tablet    Sig: Take 1 tablet (4 mg total) by mouth every 8 (eight) hours as needed.    Dispense:  20 tablet    Refill:  0    Supervising Provider:   Merrilee Jansky [[id number]]     *If you need refills on other medications prior to your next appointment, please contact your pharmacy*  Follow-Up: Call back or seek an in-person evaluation if the symptoms worsen or if the condition fails to improve as anticipated.  [facility] [phone]  Other Instructions  Influenza, Adult Influenza is also called the flu. It's an  infection that affects your respiratory tract. This includes your nose, throat, windpipe, and lungs. The flu is contagious. This means it spreads easily from person to person. It causes symptoms that are like a cold. It can also cause a high fever and body aches. What are the causes? The flu is caused by the influenza virus. You can get it by: Breathing in droplets that are in the air after an infected person coughs or sneezes. Touching something that has the virus on it and then touching your mouth, nose, or eyes. What increases the risk? You may be more likely to get the flu if: You don't wash your hands often. You're near a lot of people during cold and flu season. You touch your mouth, eyes, or nose without washing your hands first. You don't get a flu shot each year. You may also be more at risk for the flu and serious problems, such as a lung infection called pneumonia, if: You're older than 65. You're pregnant. Your immune system is weak. Your immune system is your body's defense system. You have a long-term, or chronic, condition, such as: Heart, kidney, or lung disease. Diabetes. A liver disorder. Asthma. You're very overweight. You have anemia. This is when you don't have enough red blood cells in your body. What are the signs or symptoms? Flu symptoms often start all of a sudden. They may last 4-14 days and include: Fever and chills. Headaches, body aches, or muscle aches. Sore throat. Cough. Runny or stuffy nose. Discomfort in your chest. Not wanting to eat as much as normal. Feeling weak or tired. Feeling dizzy. Nausea or vomiting. How is this diagnosed? The flu may be diagnosed based on your symptoms and medical history. You may also have a physical exam. A swab may be taken from your nose or throat and tested for the virus. How is this treated? If the flu is found early, you can be treated with antiviral medicine. This may be given to you by mouth or through an  IV. It can help you feel less sick and get better faster. Taking care of yourself at home can also help your symptoms get better. Your health care provider may tell you to: Take over-the-counter medicines. Drink lots of fluids. The flu often goes away on its own. If you have very bad symptoms or problems caused by the flu, you may need to be treated in a hospital. Follow these instructions at home: Activity Rest as needed. Get lots of sleep. Stay home from work or school as told by your provider. Leave home only to go see your provider. Do not leave home for other reasons until you don't have a fever for 24 hours without taking medicine. Eating and drinking Take an oral rehydration solution (ORS). This is  a drink that is sold at pharmacies and stores. Drink enough fluid to keep your pee pale yellow. Try to drink small amounts of clear fluids. These include water, ice chips, fruit juice mixed with water, and low-calorie sports drinks. Try to eat bland foods that are easy to digest. These include bananas, applesauce, rice, lean meats, toast, and crackers. Avoid drinks that have a lot of sugar or caffeine in them. These include energy drinks, regular sports drinks, and soda. Do not drink alcohol. Do not eat spicy or fatty foods. General instructions     Take your medicines only as told by your provider. Use a cool mist humidifier to add moisture to the air in your home. This can make it easier for you to breathe. You should also clean the humidifier every day. To do so: Empty the water. Pour clean water in. Cover your mouth and nose when you cough or sneeze. Wash your hands with soap and water often and for at least 20 seconds. It's extra important to do so after you cough or sneeze. If you can't use soap and water, use hand sanitizer. How is this prevented?  Get a flu shot every year. Ask your provider when you should get your flu shot. Stay away from people who are sick during fall and  winter. Fall and winter are cold and flu season. Contact a health care provider if: You get new symptoms. You have chest pain. You have watery poop, also called diarrhea. You have a fever. Your cough gets worse. You start to have more mucus. You feel like you may vomit, or you vomit. Get help right away if: You become short of breath or have trouble breathing. Your skin or nails turn blue. You have very bad pain or stiffness in your neck. You get a sudden headache or pain in your face or ear. You vomit each time you eat or drink. These symptoms may be an emergency. Call 911 right away. Do not wait to see if the symptoms will go away. Do not drive yourself to the hospital. This information is not intended to replace advice given to you by your health care provider. Make sure you discuss any questions you have with your health care provider. Document Revised: 11/12/2022 Document Reviewed: 11/12/2022 Elsevier Patient Education  2024 Elsevier Inc.    If you have been instructed to have an in-person evaluation today at a local Urgent Care facility, please use the link below. It will take you to a list of all of our available [facility], including address, phone number and hours of operation. Please do not delay care.  [facility]  If you or a family member do not have a primary care provider, use the link below to schedule a visit and establish care. When you choose a [facility] primary care physician or advanced practice provider, you gain a long-term partner in health. Find a Primary Care Provider  Learn more about [facility] in-office and virtual care options: [facility] - Get Care Now

## 2023-11-10 ENCOUNTER — Ambulatory Visit: Payer: Medicare Other

## 2023-11-11 ENCOUNTER — Ambulatory Visit (INDEPENDENT_AMBULATORY_CARE_PROVIDER_SITE_OTHER): Payer: Medicare Other

## 2023-11-11 ENCOUNTER — Ambulatory Visit
Admission: RE | Admit: 2023-11-11 | Discharge: 2023-11-11 | Disposition: A | Discharge status: Home / Self Care | Payer: Medicare Other | Source: Ambulatory Visit

## 2023-11-11 VITALS — BP 141/94 | HR 98 | Temp 98.2°F | Resp 20 | Ht 62.0 in | Wt 375.4 lb

## 2023-11-11 DIAGNOSIS — R051 Acute cough: Secondary | ICD-10-CM

## 2023-11-11 DIAGNOSIS — J209 Acute bronchitis, unspecified: Secondary | ICD-10-CM

## 2023-11-11 DIAGNOSIS — M1990 Unspecified osteoarthritis, unspecified site: Secondary | ICD-10-CM | POA: Insufficient documentation

## 2023-11-11 LAB — POC COVID19/FLU A&B COMBO
Covid Antigen, POC: NEGATIVE
Influenza A Antigen, POC: NEGATIVE
Influenza B Antigen, POC: NEGATIVE

## 2023-11-11 LAB — POCT FASTING CBG KUC MANUAL ENTRY: POCT Glucose (KUC): 202 mg/dL — AB (ref 70–99)

## 2023-11-11 MED ORDER — PREDNISONE 20 MG PO TABS: 40.0000 mg | ORAL_TABLET | Freq: Every day | ORAL | 0 refills | Status: AC

## 2023-11-11 NOTE — ED Triage Notes (Signed)
excessive coughing, sore throat, nausea, chills, hot spells. diarrhea, can't get comfortable, can't sleep, no taste or smell - Entered by patient   Symptoms onset last Wednesday. No fever per pt just hot flashes.

## 2023-11-15 ENCOUNTER — Ambulatory Visit: Payer: Self-pay | Admitting: Licensed Clinical Social Worker

## 2023-11-16 NOTE — Patient Instructions (Signed)
Visit Information  Thank you for taking time to visit with me today. Please don't hesitate to contact me if I can be of assistance to you.   Following are the goals we discussed today:   Goals Addressed             This Visit's Progress    Obtain Supportive Resources-Stress Management   On track    Activities and task to complete in order to accomplish goals.   Keep all upcoming appointments discussed today Continue with compliance of taking medication prescribed by Doctor Implement healthy coping skills discussed to assist with management of symptoms         Our next appointment is by telephone on 3/3 at 11 AM  Please call the care guide team at [phone] if you need to cancel or reschedule your appointment.   If you are experiencing a Mental Health or Behavioral Health Crisis or need someone to talk to, please call the Suicide and Crisis Lifeline: 988 call 911   Patient verbalizes understanding of instructions and care plan provided today and agrees to view in MyChart. Active MyChart status and patient understanding of how to access instructions and care plan via MyChart confirmed with patient.     Windy Fast [facility]  [facility], [facility] Clinical Social Worker Direct Dial: [phone]  Fax: [phone] Website: [email or web address] 3:45 PM

## 2023-11-16 NOTE — Patient Outreach (Signed)
  Care Coordination   Follow Up Visit Note   11/15/2023 Name: Nessie Nong MRN: [id number] DOB: 02/21/1981  Takia Runyon is a 43 y.o. year old female who sees Hoy Register, MD for primary care. I spoke with  [name] by phone today.  What matters to the patients health and wellness today?  Supportive Resources, Symptom Management    Goals Addressed             This Visit's Progress    Obtain Supportive Resources-Stress Management   On track    Activities and task to complete in order to accomplish goals.   Keep all upcoming appointments discussed today Continue with compliance of taking medication prescribed by Doctor Implement healthy coping skills discussed to assist with management of symptoms         SDOH assessments and interventions completed:  No     Care Coordination Interventions:  Yes, provided  Interventions Today    Flowsheet Row Most Recent Value  Chronic Disease   Chronic disease during today's visit Diabetes  [Epilepsy, Bipolar Affective Disorder]  General Interventions   General Interventions Discussed/Reviewed General Interventions Reviewed, Doctor Visits  [Pt discussed barriers in obtaining benefits through local agencies due to [employer] (eyewear) Strategies discussed. Pt agreed to f/up with LCSW regarding upcoming appt]  Doctor Visits Discussed/Reviewed Doctor Visits Reviewed  Mental Health Interventions   Mental Health Discussed/Reviewed Mental Health Reviewed, Coping Strategies       Follow up plan: Follow up call scheduled for 4-6 weeks    Encounter Outcome:  Patient Visit Completed   Jenel Lucks, LCSW [facility]  [facility], [facility] Clinical Social Worker Direct Dial: [phone]  Fax: [phone] Website: [email or web address] 3:44 PM

## 2023-11-17 ENCOUNTER — Telehealth: Payer: Medicare Other | Admitting: Physician Assistant

## 2023-11-17 DIAGNOSIS — B370 Candidal stomatitis: Secondary | ICD-10-CM

## 2023-11-17 DIAGNOSIS — L219 Seborrheic dermatitis, unspecified: Secondary | ICD-10-CM

## 2023-11-17 DIAGNOSIS — J208 Acute bronchitis due to other specified organisms: Secondary | ICD-10-CM | POA: Diagnosis not present

## 2023-11-17 DIAGNOSIS — B9689 Other specified bacterial agents as the cause of diseases classified elsewhere: Secondary | ICD-10-CM

## 2023-11-17 MED ORDER — KETOCONAZOLE 2 % EX SHAM: 1.0000 | MEDICATED_SHAMPOO | CUTANEOUS | 0 refills | Status: AC

## 2023-11-17 MED ORDER — AZITHROMYCIN 250 MG PO TABS: ORAL_TABLET | ORAL | 0 refills | Status: AC

## 2023-11-17 MED ORDER — FLUCONAZOLE 150 MG PO TABS: 150.0000 mg | ORAL_TABLET | ORAL | 0 refills | Status: DC

## 2023-11-17 MED ORDER — BENZONATATE 100 MG PO CAPS: 100.0000 mg | ORAL_CAPSULE | Freq: Three times a day (TID) | ORAL | 0 refills | Status: DC | PRN
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
## 2023-11-17 NOTE — Patient Instructions (Signed)
[name], thank you for joining [name], PA-C for today's virtual visit.  While this provider is not your primary care provider (PCP), if your PCP is located in our provider database this encounter information will be shared with them immediately following your visit.   A [facility] MyChart account gives you access to today's visit and all your visits, tests, and labs performed at [facility] " click here if you don't have a [facility] MyChart account or go to [email or web address]  Consent: (Patient) [name] [name] provided verbal consent for this virtual visit at the beginning of the encounter.  Current Medications:  Current Outpatient Medications:    azithromycin (ZITHROMAX) 250 MG tablet, Take 2 tablets on day 1, then 1 tablet daily on days 2 through 5, Disp: 6 tablet, Rfl: 0   benzonatate (TESSALON) 100 MG capsule, Take 1-2 capsules (100-200 mg total) by mouth 3 (three) times daily as needed., Disp: 30 capsule, Rfl: 0   fluconazole (DIFLUCAN) 150 MG tablet, Take 1 tablet (150 mg total) by mouth once a week., Disp: 4 tablet, Rfl: 0   albuterol (VENTOLIN HFA) 108 (90 Base) MCG/ACT inhaler, Inhale 2 puffs into the lungs every 6 (six) hours as needed for wheezing or shortness of breath., Disp: 8 g, Rfl: 0   ammonium lactate (AMLACTIN) 12 % cream, Apply 1 Application topically as needed for dry skin., Disp: 385 g, Rfl: 3   atorvastatin (LIPITOR) 20 MG tablet, Take 1 tablet (20 mg total) by mouth daily., Disp: 90 tablet, Rfl: 1   COVID-19 At-Home Test KIT, Use to test for COVID., Disp: 1 kit, Rfl: 0   furosemide (LASIX) 20 MG tablet, Take 1 tablet (20 mg total) by mouth daily. X 5days for swelling, Disp: 15 tablet, Rfl: 0   gabapentin (NEURONTIN) 300 MG capsule, Take 1 capsule (300 mg total) by mouth 3 (three) times daily., Disp: 90 capsule, Rfl: 4   gabapentin (NEURONTIN) 300 MG capsule, Take 300 mg by mouth 2 (two) times daily., Disp: , Rfl:    hydrOXYzine  (ATARAX) 10 MG tablet, Take 1 tablet (10 mg total) by mouth 3 (three) times daily as needed., Disp: 30 tablet, Rfl: 0   hydrOXYzine (ATARAX) 25 MG tablet, Take 25 mg by mouth 3 (three) times daily., Disp: , Rfl:    [START ON 11/18/2023] ketoconazole (NIZORAL) 2 % shampoo, Apply 1 Application topically 2 (two) times a week. X 2 weeks, Disp: 240 mL, Rfl: 0   lacosamide (VIMPAT) 50 MG TABS tablet, Take 1 tablet (50 mg total) by mouth 2 (two) times daily., Disp: 60 tablet, Rfl: 11   metFORMIN (GLUCOPHAGE) 1000 MG tablet, Take 1 tablet (1,000 mg total) by mouth 2 (two) times daily with a meal., Disp: 180 tablet, Rfl: 3   nystatin cream (MYCOSTATIN), Apply 1 Application topically 2 (two) times daily., Disp: 60 g, Rfl: 0   ondansetron (ZOFRAN-ODT) 4 MG disintegrating tablet, Take 1 tablet (4 mg total) by mouth every 8 (eight) hours as needed., Disp: 20 tablet, Rfl: 0   oseltamivir (TAMIFLU) 75 MG capsule, Take 1 capsule (75 mg total) by mouth 2 (two) times daily., Disp: 10 capsule, Rfl: 0   phenytoin (DILANTIN) 100 MG ER capsule, Take 2 capsules (200 mg total) by mouth 2 (two) times daily., Disp: 120 capsule, Rfl: 5   promethazine-dextromethorphan (PROMETHAZINE-DM) 6.25-15 MG/5ML syrup, Take 5 mLs by mouth 4 (four) times daily as needed for cough., Disp: 118 mL, Rfl: 0   tirzepatide (MOUNJARO) 2.5 MG/0.5ML  Pen, Inject 2.5 mg into the skin once a week., Disp: 2 mL, Rfl: 1   topiramate (TOPAMAX) 200 MG tablet, Take 1 tablet (200 mg total) by mouth 2 (two) times daily., Disp: 60 tablet, Rfl: 5   topiramate (TOPAMAX) 25 MG tablet, Take 25 mg by mouth daily., Disp: , Rfl:    Medications ordered in this encounter:  Meds ordered this encounter  Medications   azithromycin (ZITHROMAX) 250 MG tablet    Sig: Take 2 tablets on day 1, then 1 tablet daily on days 2 through 5    Dispense:  6 tablet    Refill:  0    Supervising Provider:   Merrilee Jansky [[id number]]   fluconazole (DIFLUCAN) 150 MG tablet    Sig:  Take 1 tablet (150 mg total) by mouth once a week.    Dispense:  4 tablet    Refill:  0    Supervising Provider:   Merrilee Jansky [[id number]]   benzonatate (TESSALON) 100 MG capsule    Sig: Take 1-2 capsules (100-200 mg total) by mouth 3 (three) times daily as needed.    Dispense:  30 capsule    Refill:  0    Supervising Provider:   Merrilee Jansky [[id number]]   ketoconazole (NIZORAL) 2 % shampoo    Sig: Apply 1 Application topically 2 (two) times a week. X 2 weeks    Dispense:  240 mL    Refill:  0    Supervising Provider:   Merrilee Jansky [[id number]]     *If you need refills on other medications prior to your next appointment, please contact your pharmacy*  Follow-Up: Call back or seek an in-person evaluation if the symptoms worsen or if the condition fails to improve as anticipated.  [facility] [phone]  Other Instructions  Oral Thrush, Adult Oral thrush, also called oral candidiasis, is a fungal infection that develops in the mouth and throat and on the tongue. It causes white patches to form in the mouth and on the tongue. Many cases of thrush are mild, but this infection can also be serious. Ginette Pitman can be a repeated (recurrent) problem for certain people who have a weak body defense system (immune system). The weakness can be caused by chronic illnesses, or by taking medicines that limit the body's ability to fight infection. If a person has difficulty fighting infection, the fungus that causes thrush can spread through the body. This can cause life-threatening blood or organ infections. What are the causes? This condition is caused by a fungus (yeast) called Candida albicans. This fungus is normally present in small amounts in the mouth and on other mucous membranes. It usually causes no harm. If conditions are present that allow the fungus to grow without control, it invades surrounding tissues and becomes an infection. Other Candida species can also lead  to thrush, though this is rare. What increases the risk? The following factors may make you more likely to develop this condition: Having a weakened immune system. Being an older adult. Having diabetes, cancer, or HIV (human immunodeficiency virus). Having dry mouth (xerostomia). Being pregnant or breastfeeding. Having poor dental care, especially in those who have dentures. Using antibiotic or steroid medicines. What are the signs or symptoms? Symptoms of this condition can vary from mild and moderate to severe and persistent. Symptoms may include: A burning feeling in the mouth and throat. This can occur at the start of a thrush infection. White patches that stick  to the mouth and tongue. The tissue around the patches may be red, raw, and painful. If rubbed (during tooth brushing, for example), the patches and the tissue of the mouth may bleed easily. A bad taste in the mouth or difficulty tasting foods. A cottony feeling in the mouth. Pain during eating and swallowing. Poor appetite. Cracking at the corners of the mouth. How is this diagnosed? This condition is diagnosed based on: A physical exam. Your medical history. How is this treated? This condition is treated with medicines called antifungals, which prevent the growth of fungi. These medicines are either applied directly to the affected area (topical) or swallowed (oral). The treatment will depend on the severity of the condition. Mild cases of thrush may be treated with an antifungal mouth rinse or lozenges. Treatment usually lasts about 14 days. Moderate to severe cases of thrush can be treated with oral antifungal medicine, if they have spread to the esophagus. A topical antifungal medicine may also be used. For some severe infections, treatment may need to continue for more than 14 days. Oral antifungal medicines are rarely used during pregnancy because they may be harmful to the unborn child. If you are pregnant, talk with  your health care provider about options for treatment. Persistent or recurrent thrush. For cases of thrush that do not go away or keep coming back: Treatment may be needed twice as long as the symptoms last. Treatment will include both oral and topical antifungal medicines. People with a weakened immune system can take an antifungal medicine on a continuous basis to prevent thrush infections. It is important to treat conditions that make a person more likely to get thrush, such as diabetes or HIV. Follow these instructions at home: Relieving soreness and discomfort To help reduce the discomfort of thrush: Drink cold liquids such as water or iced tea. Try flavored ice treats or frozen juices. Eat foods that are easy to swallow, such as gelatin, ice cream, or custard. Try drinking from a straw if the patches in your mouth are painful.  General instructions Take or use over-the-counter and prescription medicines only as told by your health care provider. Eat plain, unflavored yogurt as directed by your health care provider. Check the label to make sure the yogurt contains live cultures. This yogurt can help healthy bacteria grow in the mouth and can stop the growth of the fungus that causes thrush. If you wear dentures, remove the dentures before going to bed, brush them vigorously, and soak them in a cleaning solution as directed by your health care provider. Rinse your mouth with a warm salt-water mixture several times a day. To make a salt-water mixture, dissolve -1 tsp (3-6 g) of salt in 1 cup (237 mL) of warm water. Contact a health care provider if: Your symptoms are getting worse or are not improving within 7 days of starting treatment. You have symptoms of a spreading infection, such as white patches on the skin outside of the mouth. You are breastfeeding your baby and you have redness and pain in the nipples. Summary Oral thrush, also called oral candidiasis, is a fungal infection that  develops in the mouth and throat and on the tongue. It causes white patches to form in the mouth and on the tongue. You are more likely to get this condition if you have a weakened immune system or an underlying condition, such as HIV, cancer, or diabetes. This condition is treated with medicines called antifungals, which prevent the growth of fungi.  Contact a health care provider if your symptoms do not improve, or get worse, within 7 days of starting treatment. This information is not intended to replace advice given to you by your health care provider. Make sure you discuss any questions you have with your health care provider. Document Revised: 09/21/2022 Document Reviewed: 09/21/2022 Elsevier Patient Education  2024 Elsevier Inc.   If you have been instructed to have an in-person evaluation today at a local Urgent Care facility, please use the link below. It will take you to a list of all of our available [facility], including address, phone number and hours of operation. Please do not delay care.  [facility]  If you or a family member do not have a primary care provider, use the link below to schedule a visit and establish care. When you choose a [facility] primary care physician or advanced practice provider, you gain a long-term partner in health. Find a Primary Care Provider  Learn more about [facility]'s in-office and virtual care options: [facility] Get Care Now
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
## 2023-11-19 ENCOUNTER — Ambulatory Visit (INDEPENDENT_AMBULATORY_CARE_PROVIDER_SITE_OTHER): Payer: Medicare Other | Admitting: Neurology

## 2023-11-19 ENCOUNTER — Encounter: Payer: Self-pay | Admitting: Neurology

## 2023-11-19 ENCOUNTER — Other Ambulatory Visit: Payer: Self-pay

## 2023-11-19 VITALS — BP 146/83 | HR 92 | Ht 61.0 in | Wt 359.8 lb

## 2023-11-19 DIAGNOSIS — M8589 Other specified disorders of bone density and structure, multiple sites: Secondary | ICD-10-CM | POA: Diagnosis not present

## 2023-11-19 DIAGNOSIS — G40109 Localization-related (focal) (partial) symptomatic epilepsy and epileptic syndromes with simple partial seizures, not intractable, without status epilepticus: Secondary | ICD-10-CM

## 2023-11-19 DIAGNOSIS — Z79899 Other long term (current) drug therapy: Secondary | ICD-10-CM
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
## 2023-11-19 NOTE — Addendum Note (Signed)
Addended by: Dimas Chyle on: 11/19/2023 11:13 AM   Modules accepted: Orders

## 2023-11-29 ENCOUNTER — Telehealth: Payer: Self-pay | Admitting: Neurology

## 2023-11-29 NOTE — Telephone Encounter (Signed)
 The pt needs sheena to call her regarding the paperwork she dropped off for her to get her DL.

## 2023-11-29 NOTE — Telephone Encounter (Signed)
Paperwork pending review

## 2023-11-30 DIAGNOSIS — Z0279 Encounter for issue of other medical certificate: Secondary | ICD-10-CM

## 2023-11-30 NOTE — Telephone Encounter (Signed)
Per patient paperwork faxed over to the [facility], and copy at the front desk.

## 2023-12-01 ENCOUNTER — Ambulatory Visit: Payer: Medicare Other | Admitting: Dermatology

## 2023-12-01 ENCOUNTER — Telehealth: Payer: Self-pay | Admitting: Neurology

## 2023-12-01 NOTE — Telephone Encounter (Signed)
Pt called in stating the paperwork did not get to the [facility], she would like it faxed in again. Their fax number is [phone] ATTN Medical [facility]

## 2023-12-02 ENCOUNTER — Telehealth: Payer: Medicare Other

## 2023-12-02 NOTE — Telephone Encounter (Signed)
Patient states that the [facility] gave her a different fax number [phone]  Can you please try this number

## 2023-12-02 NOTE — Telephone Encounter (Signed)
Pt called in stating the paperwork did not get to the [facility], she would like it faxed in again. Their fax number is [phone] ATTN Medical Review Unit   Pt called in again 12/02/23 2:45pm. She is very angry that no one has called her back regarding this matter. She is upset the forms have not been faxed. She said she has been calling non stop for a while now and nothing has been done. She is requesting a call today and the forms faxed

## 2023-12-06 NOTE — Telephone Encounter (Signed)
 Paperwork faxed

## 2023-12-08 ENCOUNTER — Ambulatory Visit: Payer: Medicare Other | Attending: Physician Assistant | Admitting: Physician Assistant

## 2023-12-08 DIAGNOSIS — B370 Candidal stomatitis: Secondary | ICD-10-CM | POA: Diagnosis not present

## 2023-12-08 DIAGNOSIS — R432 Parageusia: Secondary | ICD-10-CM

## 2023-12-08 DIAGNOSIS — Z7985 Long-term (current) use of injectable non-insulin antidiabetic drugs: Secondary | ICD-10-CM

## 2023-12-08 DIAGNOSIS — Z79899 Other long term (current) drug therapy: Secondary | ICD-10-CM | POA: Insufficient documentation

## 2023-12-08 DIAGNOSIS — R439 Unspecified disturbances of smell and taste: Secondary | ICD-10-CM | POA: Diagnosis not present

## 2023-12-08 DIAGNOSIS — R6889 Other general symptoms and signs: Secondary | ICD-10-CM

## 2023-12-08 DIAGNOSIS — R062 Wheezing: Secondary | ICD-10-CM | POA: Insufficient documentation

## 2023-12-08 DIAGNOSIS — E1165 Type 2 diabetes mellitus with hyperglycemia: Secondary | ICD-10-CM | POA: Insufficient documentation

## 2023-12-08 DIAGNOSIS — G8929 Other chronic pain: Secondary | ICD-10-CM | POA: Diagnosis not present

## 2023-12-08 DIAGNOSIS — M25512 Pain in left shoulder: Secondary | ICD-10-CM | POA: Diagnosis not present

## 2023-12-08 DIAGNOSIS — Z7984 Long term (current) use of oral hypoglycemic drugs: Secondary | ICD-10-CM

## 2023-12-08 MED ORDER — MELOXICAM 7.5 MG PO TABS: 7.5000 mg | ORAL_TABLET | Freq: Every day | ORAL | 0 refills | Status: DC

## 2023-12-08 MED ORDER — GABAPENTIN 300 MG PO CAPS: 300.0000 mg | ORAL_CAPSULE | Freq: Three times a day (TID) | ORAL | 4 refills | Status: AC

## 2023-12-08 MED ORDER — ALBUTEROL SULFATE HFA 108 (90 BASE) MCG/ACT IN AERS: 2.0000 | INHALATION_SPRAY | Freq: Four times a day (QID) | RESPIRATORY_TRACT | 0 refills | Status: DC | PRN

## 2023-12-08 MED ORDER — FLUCONAZOLE 150 MG PO TABS: 150.0000 mg | ORAL_TABLET | ORAL | 0 refills | Status: DC

## 2023-12-08 NOTE — Progress Notes (Signed)
 Virtual Visit via Video Note  I connected with [name] [name] on 12/08/23 at  3:50 PM EST by a video enabled telemedicine application and verified that I am speaking with the correct person using two identifiers.  Location: Patient: home Provider: home office due to inclement weather   I discussed the limitations of evaluation and management by telemedicine and the availability of in person appointments. The patient expressed understanding and agreed to proceed.  History of Present Illness:  patient would like referral for chronic L shoulder pain and probable arthritis that was seen on xray a couple months ago.  Also wants ENT referral bc since having strep throat a few months ago and taste buds have changed since then.  Not f=drinking enough water  Says her blood sugars are running 130-160.  However, c/o "yeast in my mouth" like she has had before.  She has not had a normal A1C in over a year.  10.5, 11.4, and 9.7.    Needs RF on a few meds   Observations/Objective:  NAD.  Non-labored breathing   Assessment and Plan:   1. Type 2 diabetes mellitus with hyperglycemia, unspecified whether long term insulin use (HCC) Likely uncontrolled.  Doubt compliance.  Reviewed importance of following diabetic diet and taking meds/checking blood sugars as directed - gabapentin (NEURONTIN) 300 MG capsule; Take 1 capsule (300 mg total) by mouth 3 (three) times daily.  Dispense: 90 capsule; Refill: 4  2. Flu-like symptoms(a month or so ago.  Residual wheeze) Mostly resolved - albuterol (VENTOLIN HFA) 108 (90 Base) MCG/ACT inhaler; Inhale 2 puffs into the lungs every 6 (six) hours as needed for wheezing or shortness of breath.  Dispense: 18 g; Refill: 0  3. Left shoulder pain, unspecified chronicity (Primary) - gabapentin (NEURONTIN) 300 MG capsule; Take 1 capsule (300 mg total) by mouth 3 (three) times daily.  Dispense: 90 capsule; Refill: 4 - meloxicam (MOBIC) 7.5 MG tablet; Take 1 tablet (7.5 mg  total) by mouth daily. Prn pain  Dispense: 30 tablet; Refill: 0 - Ambulatory referral to Orthopedic Surgery  4. Thrush, oral Needs blood sugar controlled - fluconazole (DIFLUCAN) 150 MG tablet; Take 1 tablet (150 mg total) by mouth once a week.  Dispense: 4 tablet; Refill: 0  5. Altered taste since having strep - Ambulatory referral to ENT  Follow Up Instructions: See PCP in about 6-8 weeks   I discussed the assessment and treatment plan with the patient. The patient was provided an opportunity to ask questions and all were answered. The patient agreed with the plan and demonstrated an understanding of the instructions.   The patient was advised to call back or seek an in-person evaluation if the symptoms worsen or if the condition fails to improve as anticipated.  I provided 19 minutes of non-face-to-face time during this encounter.   Georgian Co, PA-C  Patient ID: [name] [name], female   DOB: 02-12-1981, 43 y.o.   MRN: [id number]

## 2023-12-10 ENCOUNTER — Other Ambulatory Visit (INDEPENDENT_AMBULATORY_CARE_PROVIDER_SITE_OTHER): Payer: Medicare Other

## 2023-12-10 ENCOUNTER — Ambulatory Visit (INDEPENDENT_AMBULATORY_CARE_PROVIDER_SITE_OTHER): Payer: Medicare Other | Admitting: Orthopaedic Surgery

## 2023-12-10 DIAGNOSIS — M25511 Pain in right shoulder: Secondary | ICD-10-CM

## 2023-12-10 DIAGNOSIS — M25512 Pain in left shoulder: Secondary | ICD-10-CM

## 2023-12-10 DIAGNOSIS — G8929 Other chronic pain: Secondary | ICD-10-CM

## 2023-12-10 MED ORDER — DICLOFENAC SODIUM 75 MG PO TBEC: 75.0000 mg | DELAYED_RELEASE_TABLET | Freq: Two times a day (BID) | ORAL | 2 refills | Status: DC | PRN
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
## 2023-12-15 ENCOUNTER — Ambulatory Visit: Payer: Medicare Other

## 2023-12-16 ENCOUNTER — Ambulatory Visit: Payer: Medicare Other

## 2023-12-17 ENCOUNTER — Ambulatory Visit (INDEPENDENT_AMBULATORY_CARE_PROVIDER_SITE_OTHER): Payer: Medicare Other | Admitting: Orthopaedic Surgery

## 2023-12-17 ENCOUNTER — Other Ambulatory Visit (INDEPENDENT_AMBULATORY_CARE_PROVIDER_SITE_OTHER): Payer: Self-pay

## 2023-12-17 ENCOUNTER — Encounter: Payer: Self-pay | Admitting: Orthopaedic Surgery

## 2023-12-17 DIAGNOSIS — M79672 Pain in left foot: Secondary | ICD-10-CM
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
## 2023-12-20 ENCOUNTER — Ambulatory Visit: Payer: Self-pay | Admitting: Licensed Clinical Social Worker

## 2023-12-20 NOTE — Patient Instructions (Signed)
 Visit Information  Thank you for taking time to visit with me today. Please don't hesitate to contact me if I can be of assistance to you.   Following are the goals we discussed today:   Goals Addressed             This Visit's Progress    Obtain Supportive Resources-Stress Management   On track    Activities and task to complete in order to accomplish goals.   Keep all upcoming appointments discussed today Continue with compliance of taking medication prescribed by Doctor Implement healthy coping skills discussed to assist with management of symptoms Visit psychology today to review therapy options with your preferences F/up with providers about referral to a chiropractor, per your request         Our next appointment is by telephone on 4/14 at 11 AM  Please call the care guide team at [phone] if you need to cancel or reschedule your appointment.   If you are experiencing a Mental Health or Behavioral Health Crisis or need someone to talk to, please call the Suicide and Crisis Lifeline: 988 call 911   Patient verbalizes understanding of instructions and care plan provided today and agrees to view in MyChart. Active MyChart status and patient understanding of how to access instructions and care plan via MyChart confirmed with patient.     Windy Fast [facility]  [facility], [facility] Clinical Social Worker Direct Dial: [phone]  Fax: [phone] Website: [email or web address] 2:53 PM

## 2023-12-20 NOTE — Patient Outreach (Signed)
 Care Coordination   Follow Up Visit Note   12/20/2023 Name: [name] [name] MRN: [id number] DOB: 1981/01/15  [name] [name] is a 43 y.o. year old female who sees Hoy Register, MD for primary care. I spoke with  [name] by phone today.  What matters to the patients health and wellness today?  Symptom Management    Goals Addressed             This Visit's Progress    Obtain Supportive Resources-Stress Management   On track    Activities and task to complete in order to accomplish goals.   Keep all upcoming appointments discussed today Continue with compliance of taking medication prescribed by Doctor Implement healthy coping skills discussed to assist with management of symptoms Visit psychology today to review therapy options with your preferences F/up with providers about referral to a chiropractor, per your request         SDOH assessments and interventions completed:  No     Care Coordination Interventions:  Yes, provided  Interventions Today    Flowsheet Row Most Recent Value  Chronic Disease   Chronic disease during today's visit Hypertension (HTN), Diabetes, Other  [Epilepsy, Bipolar Disorder, Anxiety]  General Interventions   General Interventions Discussed/Reviewed General Interventions Reviewed, [facility], Doctor Visits  Doctor Visits Discussed/Reviewed Doctor Visits Reviewed  Mental Health Interventions   Mental Health Discussed/Reviewed Mental Health Reviewed, Coping Strategies, Anxiety  [Resources for obtaining a therapist was provided via email]  Pharmacy Interventions   Pharmacy Dicussed/Reviewed Pharmacy Topics Reviewed, Medication Adherence  Safety Interventions   Safety Discussed/Reviewed Safety Reviewed       Follow up plan: Follow up call scheduled for 4-6 weeks    Encounter Outcome:  Patient Visit Completed   Jenel Lucks, LCSW   [facility], [facility] Clinical Social Worker Direct Dial:  [phone]  Fax: [phone] Website: [email or web address] 2:53 PM

## 2023-12-30 ENCOUNTER — Ambulatory Visit
Admission: RE | Admit: 2023-12-30 | Discharge: 2023-12-30 | Discharge status: Against Medical Advice | Source: Ambulatory Visit

## 2023-12-30 ENCOUNTER — Other Ambulatory Visit: Payer: Self-pay

## 2023-12-30 NOTE — ED Triage Notes (Addendum)
 foot is swollen and frequent pain. stomach pain that comes and goes, chills/nervousness - Entered by patient  Pt states foot pain started around 2 weeks ago - says she went to ortho but the middle toe on left foot is still swelling and hurting. Pt states ortho took an xray and stated there no breakage.   Pt also states she has stomach pains, chills, x 1wk  Pt also request to have her glucose checked.

## 2023-12-31 ENCOUNTER — Ambulatory Visit
Admission: RE | Admit: 2023-12-31 | Discharge: 2023-12-31 | Disposition: A | Discharge status: Home / Self Care | Source: Ambulatory Visit | Attending: Emergency Medicine | Admitting: Emergency Medicine

## 2023-12-31 VITALS — BP 157/69 | HR 100 | Temp 98.3°F | Resp 18

## 2023-12-31 DIAGNOSIS — M79672 Pain in left foot: Secondary | ICD-10-CM | POA: Diagnosis not present

## 2023-12-31 DIAGNOSIS — R739 Hyperglycemia, unspecified: Secondary | ICD-10-CM

## 2023-12-31 DIAGNOSIS — R600 Localized edema: Secondary | ICD-10-CM

## 2023-12-31 LAB — POCT FASTING CBG KUC MANUAL ENTRY: POCT Glucose (KUC): 266 mg/dL — AB (ref 70–99)

## 2023-12-31 NOTE — Discharge Instructions (Addendum)
 Continue to take Tylenol, 500 mg every 6-8 hours for any pain in your foot.  You can ice and elevate your foot as well.  To help with your leg swelling please elevate your legs.  You can use compression stockings throughout the day to prevent and help with swelling.  Your blood sugar was elevated today at 266.  Please follow-up with your primary care provider regarding further management of your type 2 diabetes.  Return to clinic for any new or urgent symptoms.

## 2023-12-31 NOTE — ED Triage Notes (Signed)
 Pt presents for bilateral leg swelling and left leg pain. States she hit foot about two weeks ago. She was seen by ortho and told it just needed time to heal.
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
## 2024-01-07 DIAGNOSIS — R432 Parageusia: Secondary | ICD-10-CM | POA: Insufficient documentation

## 2024-01-07 DIAGNOSIS — H9311 Tinnitus, right ear: Secondary | ICD-10-CM | POA: Insufficient documentation

## 2024-01-14 ENCOUNTER — Telehealth: Payer: Self-pay

## 2024-01-14 NOTE — Telephone Encounter (Signed)
 Patient was identified as falling into the [place] Measure - Diabetes.   Patient was: Appointment scheduled for lab or office visit for A1c.   Transportation arranged for OV visit and Labs

## 2024-01-16 ENCOUNTER — Other Ambulatory Visit: Payer: Self-pay | Admitting: Physician Assistant

## 2024-01-16 DIAGNOSIS — M25512 Pain in left shoulder: Secondary | ICD-10-CM

## 2024-01-21 ENCOUNTER — Telehealth: Payer: Self-pay | Admitting: Family Medicine

## 2024-01-21 NOTE — Telephone Encounter (Signed)
Contacted pt confirmed appt

## 2024-01-26 ENCOUNTER — Ambulatory Visit: Attending: Physician Assistant | Admitting: Physician Assistant

## 2024-01-26 ENCOUNTER — Telehealth: Payer: Self-pay | Admitting: Neurology

## 2024-01-26 ENCOUNTER — Encounter: Payer: Self-pay | Admitting: Neurology

## 2024-01-26 VITALS — BP 111/73 | HR 101 | Temp 98.3°F | Ht 61.0 in | Wt 355.0 lb

## 2024-01-26 DIAGNOSIS — R109 Unspecified abdominal pain: Secondary | ICD-10-CM | POA: Diagnosis present

## 2024-01-26 DIAGNOSIS — Z7985 Long-term (current) use of injectable non-insulin antidiabetic drugs: Secondary | ICD-10-CM | POA: Insufficient documentation

## 2024-01-26 DIAGNOSIS — E119 Type 2 diabetes mellitus without complications: Secondary | ICD-10-CM | POA: Diagnosis present

## 2024-01-26 DIAGNOSIS — R601 Generalized edema: Secondary | ICD-10-CM | POA: Insufficient documentation

## 2024-01-26 DIAGNOSIS — R062 Wheezing: Secondary | ICD-10-CM | POA: Insufficient documentation

## 2024-01-26 DIAGNOSIS — K219 Gastro-esophageal reflux disease without esophagitis: Secondary | ICD-10-CM | POA: Insufficient documentation

## 2024-01-26 DIAGNOSIS — E1165 Type 2 diabetes mellitus with hyperglycemia: Secondary | ICD-10-CM | POA: Diagnosis not present

## 2024-01-26 DIAGNOSIS — M25512 Pain in left shoulder: Secondary | ICD-10-CM | POA: Diagnosis not present

## 2024-01-26 DIAGNOSIS — Z7984 Long term (current) use of oral hypoglycemic drugs: Secondary | ICD-10-CM | POA: Diagnosis not present

## 2024-01-26 LAB — POCT GLYCOSYLATED HEMOGLOBIN (HGB A1C): HbA1c, POC (controlled diabetic range): 10.6 % — AB (ref 0.0–7.0)

## 2024-01-26 LAB — GLUCOSE, POCT (MANUAL RESULT ENTRY): POC Glucose: 252 mg/dL — AB (ref 70–99)

## 2024-01-26 MED ORDER — MELOXICAM 7.5 MG PO TABS
7.5000 mg | ORAL_TABLET | Freq: Every day | ORAL | 1 refills | Status: DC
Start: 2024-01-26 — End: 2024-04-10

## 2024-01-26 MED ORDER — ALBUTEROL SULFATE HFA 108 (90 BASE) MCG/ACT IN AERS: 2.0000 | INHALATION_SPRAY | Freq: Four times a day (QID) | RESPIRATORY_TRACT | 0 refills | Status: DC | PRN

## 2024-01-26 MED ORDER — FUROSEMIDE 20 MG PO TABS
20.0000 mg | ORAL_TABLET | Freq: Every day | ORAL | 3 refills | Status: AC
Start: 2024-01-26 — End: ?

## 2024-01-26 MED ORDER — ATORVASTATIN CALCIUM 20 MG PO TABS: 20.0000 mg | ORAL_TABLET | Freq: Every day | ORAL | 1 refills | Status: AC

## 2024-01-26 MED ORDER — TIRZEPATIDE 5 MG/0.5ML ~~LOC~~ SOAJ: 5.0000 mg | SUBCUTANEOUS | 0 refills | Status: DC

## 2024-01-26 NOTE — Progress Notes (Signed)
 Patient ID: [name] [name], female   DOB: 05-21-1981, 43 y.o.   MRN: [id number]   Farrin Shadle, is a 43 y.o. female  [id number]:[id number]  [id number]  DOB - April 03, 1981  Chief Complaint  Patient presents with   Diabetes    DM f/u.  Abdomina pain - "feels like something moving inside"  Acid reflux  Swelling of bilateral legs - L leg swelling more        Subjective:   [name] [name] is a 43 y.o. female here today for diabetes check.  Still on 2.5mg  mounjaro weekly bc did not come in for titration appt as needed.  She is also taking metformin.  No diet changes.  She has lost 4 pounds.  Not checking blood sugars.  Tolerating mounjaro without problems.  Needs handicap placard  Still has swelling in LLE>R and wants to have vessel testing.  She has been to UC for this.  Lasix does help prn and does not take it often.    No problems updated.  ALLERGIES: Allergies  Allergen Reactions   Levetiracetam Other (See Comments)   Tramadol Other (See Comments)    Other Reaction(s): Other (see comments), Unknown  seizures  Other reaction(s): Other (see comments)  seizures  Other reaction(s): Other (see comments)  seizures  seizures  seizures  Other reaction(s): Other (see comments)  seizures  seizures  Other reaction(s): Other (see comments)  seizures  Other reaction(s): Other (see comments)  seizures  seizures  Other reaction(s): Other (see comments)  seizures  seizures  Other reaction(s): Other (see comments)  seizures  Other reaction(s): Other (see comments)  seizures  seizures  seizures Other reaction(s): Other (see comments) seizures Other reaction(s): Other (see comments) seizures seizures    seizures    Other Reaction(s): Other (see comments), Unknown  seizures Other reaction(s): Other (see comments) seizures Other reaction(s): Other (see comments) seizures seizures  seizures  Other reaction(s): Other (see comments) seizures seizures  Other reaction(s): Other (see  comments) seizures  Other reaction(s): Other (see comments) seizures seizures Other reaction(s): Other (see comments) seizures seizures Other reaction(s): Other (see comments) seizures Other reaction(s): Other (see comments) seizures seizures    PAST MEDICAL HISTORY: Past Medical History:  Diagnosis Date   Allergy    Anemia    has taken iron supplements in the past   Arthritis    Bipolar affective disorder (HCC)    no meds currently;has been years since taking meds   Carpal tunnel syndrome on both sides    Gestational diabetes    H/O varicella    Hypertension    Pt states she had pre-eclampsia w last pregnancy    Infection    Yeast inf;not frequent   Migraines    would be rx'd Percacet for relief   Pregnancy induced hypertension    Seizures (HCC) 05/20/2011   unsure what started seizures;Neurology appt on 05/30/12;on Kepra 2000mg  bid;was on Topramax 50mg  tid   Wears glasses    Yeast infection     MEDICATIONS AT HOME: Prior to Admission medications   Medication Sig Start Date End Date Taking? Authorizing Provider  ammonium lactate (AMLACTIN) 12 % cream Apply 1 Application topically as needed for dry skin. 08/23/23  Yes McCaughan, Dia D, DPM  benzonatate (TESSALON) 100 MG capsule Take 1-2 capsules (100-200 mg total) by mouth 3 (three) times daily as needed. 11/17/23  Yes [name], PA-C  COVID-19 At-Home Test KIT Use to test for COVID. 11/03/23  Yes Waldon Merl, PA-C  diclofenac (  VOLTAREN) 75 MG EC tablet Take 1 tablet (75 mg total) by mouth 2 (two) times daily as needed. 12/10/23  Yes Cristie Hem, PA-C  fluconazole (DIFLUCAN) 150 MG tablet Take 1 tablet (150 mg total) by mouth once a week. 12/08/23  Yes Anders Simmonds, PA-C  gabapentin (NEURONTIN) 300 MG capsule Take 1 capsule (300 mg total) by mouth 3 (three) times daily. 12/08/23  Yes Anders Simmonds, PA-C  hydrOXYzine (ATARAX) 10 MG tablet Take 1 tablet (10 mg total) by mouth 3 (three) times daily as needed.  03/04/23  Yes [name], PA-C  hydrOXYzine (ATARAX) 25 MG tablet Take 25 mg by mouth 3 (three) times daily. 10/01/23  Yes [provider]  ketoconazole (NIZORAL) 2 % shampoo Apply 1 Application topically 2 (two) times a week. X 2 weeks 11/18/23  Yes [name], PA-C  metFORMIN (GLUCOPHAGE) 1000 MG tablet Take 1 tablet (1,000 mg total) by mouth 2 (two) times daily with a meal. 10/07/23  Yes Alan Riles M, PA-C  nystatin cream (MYCOSTATIN) Apply 1 Application topically 2 (two) times daily. 03/08/23  Yes [name], PA-C  ondansetron (ZOFRAN-ODT) 4 MG disintegrating tablet Take 1 tablet (4 mg total) by mouth every 8 (eight) hours as needed. 11/04/23  Yes [name], PA-C  oseltamivir (TAMIFLU) 75 MG capsule Take 1 capsule (75 mg total) by mouth 2 (two) times daily. 11/03/23  Yes Waldon Merl, PA-C  phenytoin (DILANTIN) 100 MG ER capsule Take 2 capsules (200 mg total) by mouth 2 (two) times daily. 05/14/23  Yes Jaffe, Adam R, DO  promethazine-dextromethorphan (PROMETHAZINE-DM) 6.25-15 MG/5ML syrup Take 5 mLs by mouth 4 (four) times daily as needed for cough. 11/03/23  Yes Waldon Merl, PA-C  tirzepatide [facility]) 5 MG/0.5ML Pen Inject 5 mg into the skin once a week. 01/26/24  Yes Anders Simmonds, PA-C  tiZANidine (ZANAFLEX) 4 MG tablet Take by mouth. 08/14/15  Yes [provider]  topiramate (TOPAMAX) 200 MG tablet Take 1 tablet (200 mg total) by mouth 2 (two) times daily. 05/14/23  Yes Jaffe, Adam R, DO  topiramate (TOPAMAX) 25 MG tablet Take 25 mg by mouth daily.   Yes [provider]  albuterol (VENTOLIN HFA) 108 (90 Base) MCG/ACT inhaler Inhale 2 puffs into the lungs every 6 (six) hours as needed for wheezing or shortness of breath. 01/26/24   Anders Simmonds, PA-C  atorvastatin (LIPITOR) 20 MG tablet Take 1 tablet (20 mg total) by mouth daily. 01/26/24   Anders Simmonds, PA-C  furosemide (LASIX) 20 MG tablet Take 1 tablet (20  mg total) by mouth daily. X 5days for swelling 01/26/24   Anders Simmonds, PA-C  lacosamide (VIMPAT) 50 MG TABS tablet Take 1 tablet (50 mg total) by mouth 2 (two) times daily. Patient not taking: Reported on 01/26/2024 11/13/22   Drema Dallas, DO  meloxicam (MOBIC) 7.5 MG tablet Take 1 tablet (7.5 mg total) by mouth daily. Prn pain 01/26/24   Raunak Antuna, Marzella Schlein, PA-C    ROS: Neg HEENT Neg resp Neg cardiac Neg GI Neg GU Neg MS Neg psych Neg neuro  Objective:   Vitals:   01/26/24 1043  BP: 111/73  Pulse: (!) 101  Temp: 98.3 F (36.8 C)  TempSrc: Oral  SpO2: 97%  Weight: (!) 355 lb (161 kg)  Height: 5\' 1"  (1.549 m)   Exam General appearance : Awake, alert, not in any distress. Speech Clear. Not toxic looking;  obese  HEENT: Atraumatic and Normocephalic Neck: Supple, no JVD. No cervical lymphadenopathy.  Chest: Good air entry bilaterally, CTAB.  No rales/rhonchi/wheezing CVS: S1 S2 regular, no murmurs.  Abdomen: Bowel sounds present, Non tender and not distended with no gaurding, rigidity or rebound. Extremities: B/L Lower Ext shows minimal edema L>R, both legs are warm to touch Neurology: Awake alert, and oriented X 3, CN II-XII intact, Non focal Skin: No Rash  Data Review Lab Results  Component Value Date   HGBA1C 10.6 (A) 01/26/2024   HGBA1C 10.5 (A) 10/07/2023   HGBA1C 11.4 (A) 03/16/2023    Assessment & Plan   1. Type 2 diabetes mellitus with hyperglycemia, unspecified whether long term insulin use (HCC) (Primary) - Glucose (CBG) - HgB A1c - tirzepatide (MOUNJARO) 5 MG/0.5ML Pen; Inject 5 mg into the skin once a week.  Dispense: 6 mL; Refill: 0 - Basic Metabolic Panel; Future Please increase your water intake.  Be more active.  Compression stockings-put these on in the morning to prevent swelling.  Check your blood sugars fasting and at bedtime and record and follow up in 1 month with [facility]  2. Long-term current use of injectable noninsulin antidiabetic  medication  3. wheezing Due to pollen - albuterol (VENTOLIN HFA) 108 (90 Base) MCG/ACT inhaler; Inhale 2 puffs into the lungs every 6 (six) hours as needed for wheezing or shortness of breath.  Dispense: 18 g; Refill: 0  4. Type 2 diabetes mellitus with hyperglycemia, without long-term current use of insulin (HCC) - atorvastatin (LIPITOR) 20 MG tablet; Take 1 tablet (20 mg total) by mouth daily.  Dispense: 90 tablet; Refill: 1  5. Generalized edema - furosemide (LASIX) 20 MG tablet; Take 1 tablet (20 mg total) by mouth daily. X 5days for swelling  Dispense: 15 tablet; Refill: 3 - Ambulatory referral to Vascular Surgery  6. Left shoulder pain, unspecified chronicity - meloxicam (MOBIC) 7.5 MG tablet; Take 1 tablet (7.5 mg total) by mouth daily. Prn pain  Dispense: 30 tablet; Refill: 1  Patient is poor to keep follow up  Return in about 4 weeks (around 02/23/2024) for [facility] for DM then 3 months with PCP([place]).  The patient was given clear instructions to go to ER or return to medical center if symptoms don't improve, worsen or new problems develop. The patient verbalized understanding. The patient was told to call to get lab results if they haven't heard anything in the next week.      Georgian Co, PA-C [facility] [place], [place] [phone]   01/26/2024, 12:02 PM

## 2024-01-26 NOTE — Telephone Encounter (Signed)
 Pt came into the office wanting to see if Dr. Everlena Cooper could write a letter for her. She needs to give it for her student loans. They will forgive her student loans if she has a disability. She is wanting the letter to state she has epilepsy.

## 2024-01-26 NOTE — Patient Instructions (Signed)
 Please increase your water intake.  Be more active.  Compression stockings-put these on in the morning to prevent swelling.  Check your blood sugars fasting and at bedtime and record and follow up in 1 month with [facility]  Edema  Edema is when you have too much fluid in your body or under your skin. Edema may make your legs, feet, and ankles swell. Swelling often happens in looser tissues, such as around your eyes. This is a common condition. It gets more common as you get older. There are many possible causes of edema. These include: Eating too much salt (sodium). Being on your feet or sitting for a long time. Certain medical conditions, such as: Pregnancy. Heart failure. Liver disease. Kidney disease. Cancer. Hot weather may make edema worse. Edema is usually painless. Your skin may look swollen or shiny. Follow these instructions at home: Medicines Take over-the-counter and prescription medicines only as told by your doctor. Your doctor may prescribe a medicine to help your body get rid of extra water (diuretic). Take this medicine if you are told to take it. Eating and drinking Eat a low-salt (low-sodium) diet as told by your doctor. Sometimes, eating less salt may reduce swelling. Depending on the cause of your swelling, you may need to limit how much fluid you drink (fluid restriction). General instructions Raise the injured area above the level of your heart while you are sitting or lying down. Do not sit still or stand for a long time. Do not wear tight clothes. Do not wear garters on your upper legs. Exercise your legs. This can help the swelling go down. Wear compression stockings as told by your doctor. It is important that these are the right size. These should be prescribed by your doctor to prevent possible injuries. If elastic bandages or wraps are recommended, use them as told by your doctor. Contact a doctor if: Treatment is not working. You have heart, liver, or kidney  disease and have symptoms of edema. You have sudden and unexplained weight gain. Get help right away if: You have shortness of breath or chest pain. You cannot breathe when you lie down. You have pain, redness, or warmth in the swollen areas. You have heart, liver, or kidney disease and get edema all of a sudden. You have a fever and your symptoms get worse all of a sudden. These symptoms may be an emergency. Get help right away. Call 911. Do not wait to see if the symptoms will go away. Do not drive yourself to the hospital. Summary Edema is when you have too much fluid in your body or under your skin. Edema may make your legs, feet, and ankles swell. Swelling often happens in looser tissues, such as around your eyes. Raise the injured area above the level of your heart while you are sitting or lying down. Follow your doctor's instructions about diet and how much fluid you can drink. This information is not intended to replace advice given to you by your health care provider. Make sure you discuss any questions you have with your health care provider. Document Revised: 06/09/2021 Document Reviewed: 06/09/2021 Elsevier Patient Education  2024 [employer].

## 2024-01-27 NOTE — Telephone Encounter (Signed)
Patient advised letter is at the front desk.

## 2024-01-31 ENCOUNTER — Ambulatory Visit: Payer: Self-pay | Admitting: Licensed Clinical Social Worker

## 2024-02-02 NOTE — Patient Outreach (Signed)
 Complex Care Management   Visit Note  01/31/2024  Name:  [name] [name] MRN: [id number] DOB: 28-Apr-1981  Situation: Referral received for Complex Care Management related to [facility]/Behavioral Health diagnosis Bipolar and Anxiety  I obtained verbal consent from Patient.  Visit completed with Pt  on the phone  Background:   Past Medical History:  Diagnosis Date   Allergy    Anemia    has taken iron supplements in the past   Arthritis    Bipolar affective disorder (HCC)    no meds currently;has been years since taking meds   Carpal tunnel syndrome on both sides    Gestational diabetes    H/O varicella    Hypertension    Pt states she had pre-eclampsia w last pregnancy    Infection    Yeast inf;not frequent   Migraines    would be rx'd Percacet for relief   Pregnancy induced hypertension    Seizures (HCC) 05/20/2011   unsure what started seizures;Neurology appt on 05/30/12;on Kepra 2000mg  bid;was on Topramax 50mg  tid   Wears glasses    Yeast infection     Assessment: Patient Reported Symptoms:  Cognitive Cognitive Status: Alert and oriented to person, place, and time      Neurological      HEENT HEENT Symptoms Reported: No symptoms reported      Cardiovascular Cardiovascular Symptoms Reported: No symptoms reported    Respiratory Respiratory Symptoms Reported: No symptoms reported    Endocrine Patient reports the following symptoms related to hypoglycemia or hyperglycemia : No symptoms reported    Gastrointestinal Gastrointestinal Symptoms Reported: No symptoms reported      Genitourinary      Integumentary Integumentary Symptoms Reported: No symptoms reported    Musculoskeletal Musculoskelatal Symptoms Reviewed: No symptoms reported        Psychosocial Psychosocial Symptoms Reported: No symptoms reported Behavioral Health Conditions: Other, Anxiety (Bipolar) Behavioral Management Strategies: Coping strategies, Medication therapy Major Change/Loss/Stressor/Fears  (CP): Medical condition, self Techniques to Cope with Loss/Stress/Change: Diversional activities, Medication Do you feel physically threatened by others?: No      01/26/2024   10:51 AM  Depression screen PHQ 2/9  Decreased Interest 0  Down, Depressed, Hopeless 1  PHQ - 2 Score 1  Altered sleeping 0  Tired, decreased energy 0  Change in appetite 0  Feeling bad or failure about yourself  0  Trouble concentrating 0  Moving slowly or fidgety/restless 0  Suicidal thoughts 0  PHQ-9 Score 1  Difficult doing work/chores Not difficult at all    There were no vitals filed for this visit.  Medications Reviewed Today     Reviewed by Bridgett Larsson, LCSW (Social Worker) on 02/02/24 at 1040  Med List Status: <None>   Medication Order Taking? Sig Documenting Provider Last Dose Status Informant  albuterol (VENTOLIN HFA) 108 (90 Base) MCG/ACT inhaler [id number]  Inhale 2 puffs into the lungs every 6 (six) hours as needed for wheezing or shortness of breath. Georgian Co M, PA-C  Active   ammonium lactate (AMLACTIN) 12 % cream [id number] No Apply 1 Application topically as needed for dry skin. McCaughan, Dia D, DPM Taking Active   atorvastatin (LIPITOR) 20 MG tablet [id number]  Take 1 tablet (20 mg total) by mouth daily. Georgian Co M, PA-C  Active   benzonatate (TESSALON) 100 MG capsule [id number] No Take 1-2 capsules (100-200 mg total) by mouth 3 (three) times daily as needed. [name], PA-C Taking Active   COVID-19 At-Home Test  KIT [id number] No Use to test for COVID. Waldon Merl, PA-C Taking Active   diclofenac (VOLTAREN) 75 MG EC tablet [id number] No Take 1 tablet (75 mg total) by mouth 2 (two) times daily as needed. Cristie Hem, PA-C Taking Active   fluconazole (DIFLUCAN) 150 MG tablet [id number] No Take 1 tablet (150 mg total) by mouth once a week. Anders Simmonds, PA-C Taking Active   furosemide (LASIX) 20 MG tablet [id number]  Take 1 tablet (20 mg total) by mouth  daily. X 5days for swelling Anders Simmonds, PA-C  Active   gabapentin (NEURONTIN) 300 MG capsule [id number] No Take 1 capsule (300 mg total) by mouth 3 (three) times daily. Georgian Co M, PA-C Taking Active   hydrOXYzine (ATARAX) 10 MG tablet [id number] No Take 1 tablet (10 mg total) by mouth 3 (three) times daily as needed. [name], PA-C Taking Active   hydrOXYzine (ATARAX) 25 MG tablet [id number] No Take 25 mg by mouth 3 (three) times daily. [provider] Taking Active   ketoconazole (NIZORAL) 2 % shampoo [id number] No Apply 1 Application topically 2 (two) times a week. X 2 weeks Joycelyn Man M, [place] Taking Active   lacosamide (VIMPAT) 50 MG TABS tablet [id number] No Take 1 tablet (50 mg total) by mouth 2 (two) times daily.  Patient not taking: Reported on 01/26/2024   Drema Dallas, DO Not Taking Active   meloxicam (MOBIC) 7.5 MG tablet [id number]  Take 1 tablet (7.5 mg total) by mouth daily. Prn pain Georgian Co M, [place]  Active   metFORMIN (GLUCOPHAGE) 1000 MG tablet [id number] No Take 1 tablet (1,000 mg total) by mouth 2 (two) times daily with a meal. Anders Simmonds, PA-C Taking Active   nystatin cream (MYCOSTATIN) [id number] No Apply 1 Application topically 2 (two) times daily. Joycelyn Man M, PA-C Taking Active   ondansetron (ZOFRAN-ODT) 4 MG disintegrating tablet [id number] No Take 1 tablet (4 mg total) by mouth every 8 (eight) hours as needed. [name], PA-C Taking Active   oseltamivir (TAMIFLU) 75 MG capsule [id number] No Take 1 capsule (75 mg total) by mouth 2 (two) times daily. Waldon Merl, PA-C Taking Active   phenytoin (DILANTIN) 100 MG ER capsule [id number] No Take 2 capsules (200 mg total) by mouth 2 (two) times daily. Drema Dallas, DO Taking Active   promethazine-dextromethorphan (PROMETHAZINE-DM) 6.25-15 MG/5ML syrup [id number] No Take 5 mLs by mouth 4 (four) times daily as needed for cough. Waldon Merl, PA-C Taking Active    tirzepatide [facility]) 5 MG/0.5ML Pen [id number]  Inject 5 mg into the skin once a week. Georgian Co M, PA-C  Active   tiZANidine (ZANAFLEX) 4 MG tablet [id number] No Take by mouth. [provider] Taking Active   topiramate (TOPAMAX) 200 MG tablet [id number] No Take 1 tablet (200 mg total) by mouth 2 (two) times daily. Drema Dallas, DO Taking Active   topiramate (TOPAMAX) 25 MG tablet [id number] No Take 25 mg by mouth daily. [provider] Taking Active             Recommendation:   Continue utilizing strategies discussed to assist with symptom management  Follow Up Plan:   Telephone follow-up 2-4 weeks  Jenel Lucks, LCSW [place]  [facility], [facility] Clinical Social Worker Direct Dial: [phone]  Fax: [phone] Website: [email or web address] 10:48 AM

## 2024-02-02 NOTE — Patient Instructions (Signed)
 Visit Information  Thank you for taking time to visit with me today. Please don't hesitate to contact me if I can be of assistance to you before our next scheduled appointment.  Our next appointment is by telephone on 5/19 at 11 AM Please call the care guide team at [phone] if you need to cancel or reschedule your appointment.   Following is a copy of your care plan:   Goals Addressed             This Visit's Progress    LCSW VBCI Social Work Care Plan   On track    Problems:   Disease Management support and education needs related to Anxiety with Excessive Worry, and Bipolar Disorder  CSW Clinical Goal(s):   Over the next 90 days the Patient will attend all scheduled medical appointments as evidenced by patient report and care team review of appointment completion in electronic MEDICAL RECORD NUMBER  demonstrate a reduction in symptoms related to Anxiety with Excessive Worry, Bipolar Disorder .  Interventions:  Mental Health:  Evaluation of current treatment plan related to Anxiety with Excessive Worry, and Bipolar Disorder Active listening / Reflection utilized Depression screen reviewed Emotional Support Provided Participation in counseling encourage : Patient will review previous referrals and supportive resources provided Solution-Focued Strategies employed:  Patient Goals/Self-Care Activities:  Increase coping skills, healthy habits, and stress reduction  Plan:   Telephone follow up appointment with care management team member scheduled for:  2-4 weeks     COMPLETED: Obtain Supportive Resources-Stress Management       Activities and task to complete in order to accomplish goals.   Keep all upcoming appointments discussed today Continue with compliance of taking medication prescribed by Doctor Implement healthy coping skills discussed to assist with management of symptoms Visit psychology today to review therapy options with your preferences F/up with providers  about referral to a chiropractor, per your request         Please call the Suicide and Crisis Lifeline: 988 go to [facility] [address] [place] [phone]) call 911 if you are experiencing a Mental Health or Behavioral Health Crisis or need someone to talk to.  Patient verbalizes understanding of instructions and care plan provided today and agrees to view in MyChart. Active MyChart status and patient understanding of how to access instructions and care plan via MyChart confirmed with patient.     Arlis Bent [facility]  [facility] Clinical Social Worker Direct Dial: [phone]  Fax: [phone] Website: [email or web address] 10:48 AM

## 2024-02-06 ENCOUNTER — Telehealth: Admitting: Physician Assistant

## 2024-02-06 DIAGNOSIS — J302 Other seasonal allergic rhinitis: Secondary | ICD-10-CM

## 2024-02-06 MED ORDER — CETIRIZINE HCL 10 MG PO TABS: 10.0000 mg | ORAL_TABLET | Freq: Every day | ORAL | 0 refills | Status: AC
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
## 2024-02-11 ENCOUNTER — Ambulatory Visit: Payer: Self-pay

## 2024-02-11 NOTE — Telephone Encounter (Signed)
 Noted.

## 2024-02-11 NOTE — Telephone Encounter (Signed)
  Chief Complaint: body aches Symptoms: generalized body aches, fatigue, hot flashes Frequency: worsening over the last month Pertinent Negatives: Patient denies fever, rash, numbness,  Disposition: [] ED /[x] Urgent Care (no appt availability in office) / [] Appointment(In office/virtual)/ []  [facility]/ [] Home Care/ [] Refused Recommended Disposition /[] [facility]/ []  Follow-up with PCP Additional Notes: Patient reports she has been having "all over" severe body aches that have been worsening over the last month. Patient reports with the severe pain she is also experiencing fatigue, and hot flashes. Attempted to schedule in office appt, no availability, so patient advised to go to UC at this time for further eval. Patient advised to call back with worsening symptoms. Patient verbalized understanding and states she will go to UC now.   Copied from CRM [id number]. Topic: Clinical - Red Word Triage >> Feb 11, 2024  8:34 AM Carlatta H wrote: [facility] that prompted transfer to Nurse Triage: Patient is having pain all over her body//Severe pain all over body// Reason for Disposition  [1] SEVERE pain (e.g., excruciating, unable to do any normal activities) AND [2] not improved 2 hours after pain medicine  Answer Assessment - Initial Assessment Questions 1. ONSET: "When did the muscle aches or body pains start?"      Worsening over the last month 2. LOCATION: "What part of your body is hurting?" (e.g., entire body, arms, legs)      Entire body 3. SEVERITY: "How bad is the pain?" (Scale 1-10; or mild, moderate, severe)   - MILD (1-3): doesn't interfere with normal activities    - MODERATE (4-7): interferes with normal activities or awakens from sleep    - SEVERE (8-10):  excruciating pain, unable to do any normal activities      Severe 4. CAUSE: "What do you think is causing the pains?"     unsure 5. FEVER: "Have you been having fever?"     denies 6. OTHER SYMPTOMS: "Do you  have any other symptoms?" (e.g., chest pain, weakness, rash, cold or flu symptoms, weight loss)     Fatigue, hot flashes  Protocols used: Muscle Aches and Body Pain-A-AH

## 2024-02-17 ENCOUNTER — Other Ambulatory Visit: Payer: Self-pay | Admitting: Family Medicine

## 2024-02-17 DIAGNOSIS — B9689 Other specified bacterial agents as the cause of diseases classified elsewhere: Secondary | ICD-10-CM

## 2024-02-17 NOTE — Telephone Encounter (Unsigned)
 Copied from CRM [id number]. Topic: Clinical - Medication Refill >> Feb 17, 2024  8:19 AM Jorie Newness J wrote: Most Recent Primary Care Visit:  Provider: Hassie Lint  Department: [facility]  Visit Type: OFFICE VISIT  Date: 01/26/2024  Medication: benzonatate  (TESSALON ) 100 MG capsule  Has the patient contacted their pharmacy? No (Agent: If no, request that the patient contact the pharmacy for the refill. If patient does not wish to contact the pharmacy document the reason why and proceed with request.) (Agent: If yes, when and what did the pharmacy advise?) There are no refills available   Is this the correct pharmacy for this prescription? No If no, delete pharmacy and type the correct one.  This is the patient's preferred pharmacy:      [facility] [address] ([address] [place] - [address] [place] ([place]) [place] [address] Phone: [phone] Fax: [phone]   Has the prescription been filled recently? No  Is the patient out of the medication? Yes  Has the patient been seen for an appointment in the last year OR does the patient have an upcoming appointment? No  Can we respond through MyChart? No  Agent: Please be advised that Rx refills may take up to 3 business days. We ask that you follow-up with your pharmacy.

## 2024-02-20 NOTE — Telephone Encounter (Signed)
 Requested medication (s) are due for refill today -no  Requested medication (s) are on the active medication list -yes  Future visit scheduled -no  Last refill: 11/17/23 #30  Notes to clinic: outside provider Rx- UC  Requested Prescriptions  Pending Prescriptions Disp Refills   benzonatate  (TESSALON ) 100 MG capsule 30 capsule 0    Sig: Take 1-2 capsules (100-200 mg total) by mouth 3 (three) times daily as needed.     Ear, Nose, and Throat:  Antitussives/Expectorants Passed - 02/20/2024 11:39 AM      Passed - Valid encounter within last 12 months    Recent Outpatient Visits           3 weeks ago Type 2 diabetes mellitus with hyperglycemia, unspecified whether long term insulin  use (HCC)   [facility] [facility] [place]. [facility] [place], [place], [place]   2 months ago Left shoulder pain, unspecified chronicity   [facility] [place]. [facility], Shelvy Dickens M, PA-C   4 months ago Long-term current use of injectable noninsulin antidiabetic medication   [place] [facility] [place]. [facility], Shelvy Dickens M, [place]   11 months ago Type 2 diabetes mellitus with other specified complication, without long-term current use of insulin  [facility])   [place] [facility] [place]. [facility] Joaquin Mulberry, MD   1 year ago Type 2 diabetes mellitus with other specified complication, without long-term current use of insulin  [facility])   [place] [facility] [place]. [facility] Joaquin Mulberry, MD                 Requested Prescriptions  Pending Prescriptions Disp Refills   benzonatate  (TESSALON ) 100 MG capsule 30 capsule 0    Sig: Take 1-2 capsules (100-200 mg total) by mouth 3 (three) times daily as needed.     Ear, Nose, and Throat:  Antitussives/Expectorants Passed - 02/20/2024 11:39 AM      Passed - Valid  encounter within last 12 months    Recent Outpatient Visits           3 weeks ago Type 2 diabetes mellitus with hyperglycemia, unspecified whether long term insulin  use (HCC)   [place] [facility] [place]. [facility] [place], [place], [place]   2 months ago Left shoulder pain, unspecified chronicity   [place] [facility] [place]. [facility], Shelvy Dickens M, PA-C   4 months ago Long-term current use of injectable noninsulin antidiabetic medication   [place] [facility] [place]. [facility], Shelvy Dickens M, [place]   11 months ago Type 2 diabetes mellitus with other specified complication, without long-term current use of insulin  [facility])   [place] [facility] [place]. [facility] Joaquin Mulberry, MD   1 year ago Type 2 diabetes mellitus with other specified complication, without long-term current use of insulin  [facility])    [facility] [place]. [facility] Joaquin Mulberry, MD

## 2024-02-21 ENCOUNTER — Other Ambulatory Visit: Payer: Self-pay

## 2024-02-21 ENCOUNTER — Emergency Department (HOSPITAL_COMMUNITY)
Admission: EM | Admit: 2024-02-21 | Discharge: 2024-02-21 | Disposition: A | Discharge status: Home / Self Care | Attending: Emergency Medicine | Admitting: Emergency Medicine

## 2024-02-21 DIAGNOSIS — R197 Diarrhea, unspecified: Secondary | ICD-10-CM | POA: Diagnosis present

## 2024-02-21 DIAGNOSIS — M25571 Pain in right ankle and joints of right foot: Secondary | ICD-10-CM | POA: Diagnosis not present

## 2024-02-21 DIAGNOSIS — M25471 Effusion, right ankle: Secondary | ICD-10-CM

## 2024-02-21 DIAGNOSIS — R519 Headache, unspecified: Secondary | ICD-10-CM | POA: Insufficient documentation

## 2024-02-21 DIAGNOSIS — R109 Unspecified abdominal pain: Secondary | ICD-10-CM | POA: Diagnosis not present

## 2024-02-21 DIAGNOSIS — I872 Venous insufficiency (chronic) (peripheral): Secondary | ICD-10-CM

## 2024-02-21 LAB — COMPREHENSIVE METABOLIC PANEL WITH GFR
ALT: 23 U/L (ref 0–44)
AST: 29 U/L (ref 15–41)
Albumin: 3.6 g/dL (ref 3.5–5.0)
Alkaline Phosphatase: 71 U/L (ref 38–126)
Anion gap: 10 (ref 5–15)
BUN: 13 mg/dL (ref 6–20)
CO2: 20 mmol/L — ABNORMAL LOW (ref 22–32)
Calcium: 8.3 mg/dL — ABNORMAL LOW (ref 8.9–10.3)
Chloride: 106 mmol/L (ref 98–111)
Creatinine, Ser: 0.5 mg/dL (ref 0.44–1.00)
GFR, Estimated: >60 mL/min (ref >60)
Glucose, Bld: 257 mg/dL — ABNORMAL HIGH (ref 70–99)
Potassium: 3.4 mmol/L — ABNORMAL LOW (ref 3.5–5.1)
Sodium: 136 mmol/L (ref 135–145)
Total Bilirubin: 0.5 mg/dL (ref 0.0–1.2)
Total Protein: 7.1 g/dL (ref 6.5–8.1)

## 2024-02-21 LAB — URINALYSIS, ROUTINE W REFLEX MICROSCOPIC
Bacteria, UA: NONE SEEN
Bilirubin Urine: NEGATIVE
Glucose, UA: 150 mg/dL — AB
Hgb urine dipstick: NEGATIVE
Ketones, ur: NEGATIVE mg/dL
Leukocytes,Ua: NEGATIVE
Nitrite: NEGATIVE
Protein, ur: NEGATIVE mg/dL
Specific Gravity, Urine: 1.029 (ref 1.005–1.030)
pH: 5 (ref 5.0–8.0)

## 2024-02-21 LAB — CBC
HCT: 41.8 % (ref 36.0–46.0)
Hemoglobin: 12.6 g/dL (ref 12.0–15.0)
MCH: 26.2 pg (ref 26.0–34.0)
MCHC: 30.1 g/dL (ref 30.0–36.0)
MCV: 86.9 fL (ref 80.0–100.0)
Platelets: 330 10*3/uL (ref 150–400)
RBC: 4.81 MIL/uL (ref 3.87–5.11)
RDW: 14.4 % (ref 11.5–15.5)
WBC: 5.9 10*3/uL (ref 4.0–10.5)
nRBC: 0 % (ref 0.0–0.2)

## 2024-02-21 LAB — HCG, SERUM, QUALITATIVE: Preg, Serum: NEGATIVE

## 2024-02-21 LAB — LIPASE, BLOOD: Lipase: 29 U/L (ref 11–51)

## 2024-02-21 MED ORDER — BENZONATATE 100 MG PO CAPS: 100.0000 mg | ORAL_CAPSULE | Freq: Three times a day (TID) | ORAL | 0 refills | Status: DC | PRN

## 2024-02-21 NOTE — Discharge Instructions (Signed)
 It was a pleasure to take care of you today! Based on your history and physical exam, it is unlikely that there is an acute life threatening cause of your diarrhea and associated symptoms. Please follow-up with your PCP for continued diagnosis and treatment. If symptoms worsen or new symptoms including but not limited to fever, chills, severe abdominal pain, bloody diarrhea, or excessive vomiting arise please return to the ED for further medical care.

## 2024-02-21 NOTE — ED Triage Notes (Signed)
 Pt arrived with sinus pressure, left side abdominal, ble ankle pain, no injury. Also reports a few episodes of diarrhea, no blood. Denies any other symptoms

## 2024-02-21 NOTE — ED Provider Notes (Signed)
 [place] EMERGENCY DEPARTMENT AT [facility] Provider Note   CSN: [id number] Arrival date & time: 02/21/24  4098     History  Chief Complaint  Patient presents with   Abdominal Pain   Ankle Pain   Joint Swelling   Diarrhea   Sinus Problem    [name] [name] is a 43 y.o. female who presents today to the ED with a chief complaint of left sided abdominal discomfort, diarrhea, ankle swelling, and headache. Patient states that she also has been experiencing some sinus pressure but believes this is due to allergies which she has had in the past. Patient states that the diarrhea began last night and has continued in to today, stating she has had diarrhea approximately 7 times since this morning. She describes the diarrhea as watery and non-bloody. Denies vomiting, fever, chills, known sick contacts, flank pain, hematuria, etc. Patient states the abdominal discomfort is not really pain and has been going on for months, also states the ankle swelling has been going on for quite some time. States that her PCP is following her abdominal symptoms and has scheduled an outpatient ultrasound for her.    Abdominal Pain Associated symptoms: diarrhea   Associated symptoms: no chest pain, no chills, no constipation, no fever, no hematuria, no nausea, no shortness of breath and no vomiting   Ankle Pain Associated symptoms: no fever   Diarrhea Associated symptoms: abdominal pain and headaches   Associated symptoms: no chills, no fever and no vomiting   Sinus Problem Associated symptoms include abdominal pain and headaches. Pertinent negatives include no chest pain and no shortness of breath.       Home Medications Prior to Admission medications   Medication Sig Start Date End Date Taking? Authorizing Provider  albuterol  (VENTOLIN  HFA) 108 (90 Base) MCG/ACT inhaler Inhale 2 puffs into the lungs every 6 (six) hours as needed for wheezing or shortness of breath. 01/26/24   Hassie Lint,  PA-C  ammonium lactate  (AMLACTIN) 12 % cream Apply 1 Application topically as needed for dry skin. 08/23/23   McCaughan, Dia D, DPM  atorvastatin  (LIPITOR) 20 MG tablet Take 1 tablet (20 mg total) by mouth daily. 01/26/24   Hassie Lint, PA-C  benzonatate  (TESSALON ) 100 MG capsule Take 1-2 capsules (100-200 mg total) by mouth 3 (three) times daily as needed. 02/21/24   Newlin, Enobong, MD  cetirizine  (ZYRTEC  ALLERGY) 10 MG tablet Take 1 tablet (10 mg total) by mouth daily for 14 days. 02/06/24 02/20/24  Marciana Settle, PA-C  COVID-19 At-Home Test KIT Use to test for COVID. 11/03/23   Farris Hong, PA-C  diclofenac  (VOLTAREN ) 75 MG EC tablet Take 1 tablet (75 mg total) by mouth 2 (two) times daily as needed. 12/10/23   Sandie Cross, PA-C  fluconazole  (DIFLUCAN ) 150 MG tablet Take 1 tablet (150 mg total) by mouth once a week. 12/08/23   Hassie Lint, PA-C  furosemide  (LASIX ) 20 MG tablet Take 1 tablet (20 mg total) by mouth daily. X 5days for swelling 01/26/24   Hassie Lint, PA-C  gabapentin  (NEURONTIN ) 300 MG capsule Take 1 capsule (300 mg total) by mouth 3 (three) times daily. 12/08/23   Hassie Lint, PA-C  hydrOXYzine  (ATARAX ) 10 MG tablet Take 1 tablet (10 mg total) by mouth 3 (three) times daily as needed. 03/04/23   Angelia Kelp, PA-C  hydrOXYzine  (ATARAX ) 25 MG tablet Take 25 mg by mouth 3 (three) times daily. 10/01/23   [provider]  ketoconazole  (NIZORAL ) 2 % shampoo Apply 1 Application topically 2 (two) times a week. X 2 weeks 11/18/23   Angelia Kelp, PA-C  lacosamide  (VIMPAT ) 50 MG TABS tablet Take 1 tablet (50 mg total) by mouth 2 (two) times daily. Patient not taking: Reported on 01/26/2024 11/13/22   Merriam Abbey, DO  meloxicam  (MOBIC ) 7.5 MG tablet Take 1 tablet (7.5 mg total) by mouth daily. Prn pain 01/26/24   Hassie Lint, PA-C  metFORMIN  (GLUCOPHAGE ) 1000 MG tablet Take 1 tablet (1,000 mg total) by mouth 2 (two) times daily with a meal.  10/07/23   McClung, Stan Eans, PA-C  nystatin  cream (MYCOSTATIN ) Apply 1 Application topically 2 (two) times daily. 03/08/23   Angelia Kelp, PA-C  ondansetron  (ZOFRAN -ODT) 4 MG disintegrating tablet Take 1 tablet (4 mg total) by mouth every 8 (eight) hours as needed. 11/04/23   Angelia Kelp, PA-C  oseltamivir  (TAMIFLU ) 75 MG capsule Take 1 capsule (75 mg total) by mouth 2 (two) times daily. 11/03/23   Farris Hong, PA-C  phenytoin  (DILANTIN ) 100 MG ER capsule Take 2 capsules (200 mg total) by mouth 2 (two) times daily. 05/14/23   Merriam Abbey, DO  promethazine -dextromethorphan (PROMETHAZINE -DM) 6.25-15 MG/5ML syrup Take 5 mLs by mouth 4 (four) times daily as needed for cough. 11/03/23   Farris Hong, PA-C  tirzepatide [facility]) 5 MG/0.5ML Pen Inject 5 mg into the skin once a week. 01/26/24   Hassie Lint, PA-C  tiZANidine  (ZANAFLEX ) 4 MG tablet Take by mouth. 08/14/15   [provider]  topiramate  (TOPAMAX ) 200 MG tablet Take 1 tablet (200 mg total) by mouth 2 (two) times daily. 05/14/23   Merriam Abbey, DO  topiramate  (TOPAMAX ) 25 MG tablet Take 25 mg by mouth daily.    [provider]      Allergies    Levetiracetam  and Tramadol    Review of Systems   Review of Systems  Constitutional:  Negative for appetite change, chills and fever.  HENT:  Positive for sinus pressure.   Respiratory:  Negative for chest tightness and shortness of breath.   Cardiovascular:  Positive for leg swelling. Negative for chest pain.  Gastrointestinal:  Positive for diarrhea. Negative for abdominal distention, blood in stool, constipation, nausea and vomiting.  Genitourinary:  Negative for flank pain and hematuria.  Skin:  Negative for rash and wound.  Allergic/Immunologic: Positive for environmental allergies.  Neurological:  Positive for headaches. Negative for syncope and weakness.    Physical Exam Updated Vital Signs BP (!) 148/97 (BP Location: Left Arm)   Pulse  96   Temp 98.2 F (36.8 C) (Oral)   Resp 16   Ht 5\' 1"  (1.549 m)   Wt (!) 161 kg   LMP 01/11/2024   SpO2 95%   BMI 67.07 kg/m  Physical Exam Vitals and nursing note reviewed.  Constitutional:      General: She is awake. She is not in acute distress.    Appearance: She is well-developed. She is obese. She is not ill-appearing, toxic-appearing or diaphoretic.  HENT:     Head: Normocephalic and atraumatic.  Cardiovascular:     Rate and Rhythm: Normal rate and regular rhythm.     Heart sounds: No murmur heard. Pulmonary:     Effort: Pulmonary effort is normal.     Breath sounds: Normal breath sounds.  Abdominal:     General: Abdomen is flat. There is no distension. There are no signs  of injury.     Palpations: Abdomen is soft.     Tenderness: There is no abdominal tenderness. There is no guarding or rebound.  Musculoskeletal:     Right lower leg: Edema present.     Left lower leg: Edema present.  Skin:    General: Skin is warm and dry.     Capillary Refill: Capillary refill takes less than 2 seconds.  Neurological:     General: No focal deficit present.     Mental Status: She is alert and oriented to person, place, and time.     Motor: No weakness.  Psychiatric:        Mood and Affect: Mood normal.        Behavior: Behavior normal. Behavior is cooperative.    ED Results / Procedures / Treatments   Labs (all labs ordered are listed, but only abnormal results are displayed) Labs Reviewed  COMPREHENSIVE METABOLIC PANEL WITH GFR - Abnormal; Notable for the following components:      Result Value   Potassium 3.4 (*)    CO2 20 (*)    Glucose, Bld 257 (*)    Calcium  8.3 (*)    All other components within normal limits  LIPASE, BLOOD  CBC  HCG, SERUM, QUALITATIVE  URINALYSIS, ROUTINE W REFLEX MICROSCOPIC    EKG None  Radiology No results found.  Procedures Procedures    Medications Ordered in ED Medications - No data to display  ED Course/ Medical Decision  Making/ A&P    Patient presents to the ED for concern of diarrhea, abdominal discomfort, leg swelling, and headache this involves an extensive number of treatment options, and is a complaint that carries with it a high risk of complications and morbidity.  The differential diagnosis includes bowel obstruction, appendicitis, cholecystitis, diverticulitis, viral gastroenteritis, bacterial infection, etc.   Patient has multiple complaints including diarrhea, abdominal discomfort but not pain, leg swelling, headache, and sinus pressure. States she does have a PCP which she can follow-up with. Lab work reassuring today along with history and physical exam, no indication today for abdominal imaging or further work-up. Patient should follow-up with PCP and return if new symptoms arise or current symptoms worsen.    Co morbidities that complicate the patient evaluation  none   Lab Tests:  I Ordered, and personally interpreted labs.  The pertinent results include:  UA- turbid appearance, UA glucose 150, potassium 3.4, CO2 20, glucose 257, calcium  8.3, CBC unremarkable, serum HCG negative, lipase 29    Medicines ordered and prescription drug management:  I have reviewed the patients home medicines and have made adjustments as needed   Test Considered:  CT abdomen: patient vitals stable, denies fever, chills, no pain at time of exam, lab work not suggestive of acute infectious process    Problem List / ED Course:  Abdominal discomfort, headache, edema, sinus pressure   Reevaluation:  After the interventions noted above, I reevaluated the patient and found that they have :stayed the same   Social Determinants of Health:  none   Dispostion:  After consideration of the diagnostic results and the patients response to treatment, I feel that the patent would benefit from discharge and follow-up with PCP for continued diagnosis and treatment.   Click here for ABCD2, HEART and other  calculatorsREFRESH Note before signing :1}  Medical Decision Making Amount and/or Complexity of Data Reviewed Labs: ordered.           Final Clinical Impression(s) / ED Diagnoses Final diagnoses:  None    Rx / DC Orders ED Discharge Orders     None         Fonda Hymen, PA-C 02/21/24 1512    Deatra Face, MD 02/22/24 [phone]

## 2024-02-22 ENCOUNTER — Ambulatory Visit: Admitting: Obstetrics and Gynecology

## 2024-02-22 ENCOUNTER — Encounter: Payer: Self-pay | Admitting: Obstetrics and Gynecology

## 2024-02-22 ENCOUNTER — Other Ambulatory Visit: Payer: Self-pay

## 2024-02-22 VITALS — BP 120/72 | HR 89 | Ht 61.0 in | Wt 352.0 lb

## 2024-02-22 DIAGNOSIS — R232 Flushing: Secondary | ICD-10-CM

## 2024-02-22 DIAGNOSIS — L680 Hirsutism: Secondary | ICD-10-CM

## 2024-02-22 DIAGNOSIS — R102 Pelvic and perineal pain: Secondary | ICD-10-CM

## 2024-02-22 DIAGNOSIS — N898 Other specified noninflammatory disorders of vagina: Secondary | ICD-10-CM

## 2024-02-22 DIAGNOSIS — N926 Irregular menstruation, unspecified: Secondary | ICD-10-CM | POA: Diagnosis not present

## 2024-02-22 NOTE — Progress Notes (Unsigned)
 GYNECOLOGY OFFICE NOTE  History:  43 y.o. [id number] here today for heavy bleeding, cramping. Reports she has bad cramping with periods. Had monthly periods until November 2024 and since then, she has missed 3 of her periods. She reports that she has monthly cramping and it feels like her period will start but then doesn't. When she does bleed, it is about as heavy as it has always been. Bleeds 5-7 days.   Has new dark hair growing under her chin within the last few months. She is concerned about PCOS. Also reports having hot flashes starting recently.    Reports having vaginal yeast infection with itching every other period. Self-diagnosed. Uses dial orange bar.  Past Medical History:  Diagnosis Date   Allergy    Anemia    has taken iron supplements in the past   Arthritis    Bipolar affective disorder (HCC)    no meds currently;has been years since taking meds   Carpal tunnel syndrome on both sides    Gestational diabetes    H/O varicella    Hypertension    Pt states she had pre-eclampsia w last pregnancy    Infection    Yeast inf;not frequent   Migraines    would be rx'd Percacet for relief   Pregnancy induced hypertension    Seizures (HCC) 05/20/2011   unsure what started seizures;Neurology appt on 05/30/12;on Kepra 2000mg  bid;was on Topramax 50mg  tid   Wears glasses    Yeast infection     Past Surgical History:  Procedure Laterality Date   CESAREAN SECTION     CESAREAN SECTION N/A 12/26/2012   Procedure: Repeat cesarean section with delivery of baby;  Surgeon: Ana Balling, MD;  Location: [facility];  Service: Obstetrics;  Laterality: N/A;   TONSILLECTOMY     During high school years   WISDOM TOOTH EXTRACTION     All 4 removed     Current Outpatient Medications:    albuterol  (VENTOLIN  HFA) 108 (90 Base) MCG/ACT inhaler, Inhale 2 puffs into the lungs every 6 (six) hours as needed for wheezing or shortness of breath., Disp: 18 g, Rfl: 0   ammonium lactate  (AMLACTIN) 12 %  cream, Apply 1 Application topically as needed for dry skin., Disp: 385 g, Rfl: 3   atorvastatin  (LIPITOR) 20 MG tablet, Take 1 tablet (20 mg total) by mouth daily., Disp: 90 tablet, Rfl: 1   benzonatate  (TESSALON ) 100 MG capsule, Take 1-2 capsules (100-200 mg total) by mouth 3 (three) times daily as needed., Disp: 30 capsule, Rfl: 0   COVID-19 At-Home Test KIT, Use to test for COVID., Disp: 1 kit, Rfl: 0   diclofenac  (VOLTAREN ) 75 MG EC tablet, Take 1 tablet (75 mg total) by mouth 2 (two) times daily as needed., Disp: 60 tablet, Rfl: 2   fluconazole  (DIFLUCAN ) 150 MG tablet, Take 1 tablet (150 mg total) by mouth once a week., Disp: 4 tablet, Rfl: 0   furosemide  (LASIX ) 20 MG tablet, Take 1 tablet (20 mg total) by mouth daily. X 5days for swelling, Disp: 15 tablet, Rfl: 3   gabapentin  (NEURONTIN ) 300 MG capsule, Take 1 capsule (300 mg total) by mouth 3 (three) times daily., Disp: 90 capsule, Rfl: 4   hydrOXYzine  (ATARAX ) 10 MG tablet, Take 1 tablet (10 mg total) by mouth 3 (three) times daily as needed., Disp: 30 tablet, Rfl: 0   hydrOXYzine  (ATARAX ) 25 MG tablet, Take 25 mg by mouth 3 (three) times daily., Disp: , Rfl:  ketoconazole  (NIZORAL ) 2 % shampoo, Apply 1 Application topically 2 (two) times a week. X 2 weeks, Disp: 240 mL, Rfl: 0   lacosamide  (VIMPAT ) 50 MG TABS tablet, Take 1 tablet (50 mg total) by mouth 2 (two) times daily., Disp: 60 tablet, Rfl: 11   meloxicam  (MOBIC ) 7.5 MG tablet, Take 1 tablet (7.5 mg total) by mouth daily. Prn pain, Disp: 30 tablet, Rfl: 1   metFORMIN  (GLUCOPHAGE ) 1000 MG tablet, Take 1 tablet (1,000 mg total) by mouth 2 (two) times daily with a meal., Disp: 180 tablet, Rfl: 3   nystatin  cream (MYCOSTATIN ), Apply 1 Application topically 2 (two) times daily., Disp: 60 g, Rfl: 0   ondansetron  (ZOFRAN -ODT) 4 MG disintegrating tablet, Take 1 tablet (4 mg total) by mouth every 8 (eight) hours as needed., Disp: 20 tablet, Rfl: 0   oseltamivir  (TAMIFLU ) 75 MG capsule,  Take 1 capsule (75 mg total) by mouth 2 (two) times daily., Disp: 10 capsule, Rfl: 0   phenytoin  (DILANTIN ) 100 MG ER capsule, Take 2 capsules (200 mg total) by mouth 2 (two) times daily., Disp: 120 capsule, Rfl: 5   promethazine -dextromethorphan (PROMETHAZINE -DM) 6.25-15 MG/5ML syrup, Take 5 mLs by mouth 4 (four) times daily as needed for cough., Disp: 118 mL, Rfl: 0   tirzepatide (MOUNJARO) 5 MG/0.5ML Pen, Inject 5 mg into the skin once a week., Disp: 6 mL, Rfl: 0   tiZANidine  (ZANAFLEX ) 4 MG tablet, Take by mouth., Disp: , Rfl:    topiramate  (TOPAMAX ) 200 MG tablet, Take 1 tablet (200 mg total) by mouth 2 (two) times daily., Disp: 60 tablet, Rfl: 5   topiramate  (TOPAMAX ) 25 MG tablet, Take 25 mg by mouth daily., Disp: , Rfl:    cetirizine  (ZYRTEC  ALLERGY) 10 MG tablet, Take 1 tablet (10 mg total) by mouth daily for 14 days., Disp: 14 tablet, Rfl: 0  The following portions of the patient's history were reviewed and updated as appropriate: allergies, current medications, past family history, past medical history, past social history, past surgical history and problem list.   Review of Systems:  Pertinent items noted in HPI and remainder of comprehensive ROS otherwise negative.   Objective:  Physical Exam BP 120/72   Pulse 89   Ht 5\' 1"  (1.549 m)   Wt (!) 352 lb (159.7 kg)   LMP 01/11/2024   BMI 66.51 kg/m  CONSTITUTIONAL: Well-developed, well-nourished female in no acute distress.  HENT:  Normocephalic, atraumatic. External right and left ear normal. Oropharynx is clear and moist EYES: Conjunctivae and EOM are normal. Pupils are equal, round, and reactive to light. No scleral icterus.  NECK: Normal range of motion, supple, no masses SKIN: Skin is warm and dry. No rash noted. Not diaphoretic. No erythema. No pallor. NEUROLOGIC: Alert and oriented to person, place, and time. Normal reflexes, muscle tone coordination. No cranial nerve deficit noted. PSYCHIATRIC: Normal mood and affect.  Normal behavior. Normal judgment and thought content. CARDIOVASCULAR: Normal heart rate noted RESPIRATORY: Effort normal, no problems with respiration noted ABDOMEN: Soft, no distention noted.  Limited by habitus PELVIC: deferred MUSCULOSKELETAL: Normal range of motion. No edema noted.  Labs and Imaging No results found. Pap from care everywhere 12/09/21: normal, negative HPV  Assessment & Plan:  There are no diagnoses linked to this encounter.  1. Irregular bleeding (Primary) Not interested in hormonal management of periods for cramping Not overly bothered by periods, worried about irregular periods and cramping - DHEA-sulfate - Follicle stimulating hormone - Prolactin - Testosterone,Free and Total -  US  PELVIC COMPLETE WITH TRANSVAGINAL; Future - likely poorly controlled DM contributing, encouraged improved control of DM  2. Hot flashes  3. Vaginal itching Come in for self-swab whenever she has symptoms Reviewed symptoms may be yeast, may be other infection  4. Pelvic cramping  5. Hirsutism Check testosterone   Routine preventative health maintenance measures emphasized. Please refer to After Visit Summary for other counseling recommendations.   No follow-ups on file.   Larence Pleas, MD, [facility] Attending Center for [facility])

## 2024-02-23 LAB — TESTOSTERONE,FREE AND TOTAL
Testosterone, Free: 0.5 pg/mL (ref 0.0–4.2)
Testosterone: 12 ng/dL (ref 4–50)

## 2024-02-23 LAB — DHEA-SULFATE: DHEA-SO4: 26.1 ug/dL — ABNORMAL LOW (ref 57.3–279.2)

## 2024-02-23 LAB — PROLACTIN: Prolactin: 11.3 ng/mL (ref 4.8–33.4)

## 2024-02-23 LAB — FOLLICLE STIMULATING HORMONE: FSH: 6.5 m[IU]/mL

## 2024-02-29 ENCOUNTER — Ambulatory Visit (INDEPENDENT_AMBULATORY_CARE_PROVIDER_SITE_OTHER): Payer: Self-pay

## 2024-02-29 ENCOUNTER — Ambulatory Visit (INDEPENDENT_AMBULATORY_CARE_PROVIDER_SITE_OTHER): Admitting: Physician Assistant

## 2024-02-29 ENCOUNTER — Ambulatory Visit (HOSPITAL_COMMUNITY)
Admission: RE | Admit: 2024-02-29 | Discharge: 2024-02-29 | Disposition: A | Discharge status: Home / Self Care | Source: Ambulatory Visit | Attending: Vascular Surgery | Admitting: Vascular Surgery

## 2024-02-29 DIAGNOSIS — M79672 Pain in left foot: Secondary | ICD-10-CM

## 2024-02-29 DIAGNOSIS — M79605 Pain in left leg: Secondary | ICD-10-CM

## 2024-02-29 DIAGNOSIS — M79604 Pain in right leg: Secondary | ICD-10-CM

## 2024-02-29 DIAGNOSIS — I872 Venous insufficiency (chronic) (peripheral): Secondary | ICD-10-CM | POA: Diagnosis present

## 2024-02-29 MED ORDER — ACETAMINOPHEN-CODEINE 300-30 MG PO TABS: 1.0000 | ORAL_TABLET | Freq: Two times a day (BID) | ORAL | 1 refills | Status: DC | PRN
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
## 2024-03-02 ENCOUNTER — Ambulatory Visit (HOSPITAL_COMMUNITY): Attending: Vascular Surgery | Admitting: Physician Assistant

## 2024-03-02 ENCOUNTER — Encounter (HOSPITAL_COMMUNITY): Payer: Self-pay

## 2024-03-02 ENCOUNTER — Ambulatory Visit (HOSPITAL_COMMUNITY)

## 2024-03-02 VITALS — BP 142/91 | HR 72 | Temp 98.1°F | Ht 61.0 in | Wt 358.9 lb

## 2024-03-02 DIAGNOSIS — I872 Venous insufficiency (chronic) (peripheral): Secondary | ICD-10-CM | POA: Diagnosis not present
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
## 2024-03-06 ENCOUNTER — Other Ambulatory Visit: Payer: Self-pay | Admitting: Licensed Clinical Social Worker

## 2024-03-06 NOTE — Patient Instructions (Signed)
 Visit Information  Thank you for taking time to visit with me today. Please don't hesitate to contact me if I can be of assistance to you before our next scheduled appointment.  Your next care management appointment is by telephone on 0/16 at 11 AM   Please call the care guide team at [phone] if you need to cancel, schedule, or reschedule an appointment.   Please call the Suicide and Crisis Lifeline: 988 go to [facility] [address], [place] [phone]) call 911 if you are experiencing a Mental Health or Behavioral Health Crisis or need someone to talk to.  Alease Hunter, LCSW [place]  [facility], [facility] Clinical Social Worker Direct Dial: [phone]  Fax: [phone] Website: [email or web address] 12:18 PM

## 2024-03-06 NOTE — Patient Outreach (Signed)
 Complex Care Management   Visit Note  03/06/2024  Name:  [name] [name] MRN: [id number] DOB: 1981-06-01  Situation: Referral received for Complex Care Management related to Mental/Behavioral Health diagnosis Bipolar/Depression I obtained verbal consent from Patient.  Visit completed with pt  on the phone  Background:   Past Medical History:  Diagnosis Date   Allergy    Anemia    has taken iron supplements in the past   Arthritis    Bipolar affective disorder (HCC)    no meds currently;has been years since taking meds   Carpal tunnel syndrome on both sides    Gestational diabetes    H/O varicella    Hypertension    Pt states she had pre-eclampsia w last pregnancy    Infection    Yeast inf;not frequent   Migraines    would be rx'd Percacet for relief   Pregnancy induced hypertension    Seizures (HCC) 05/20/2011   unsure what started seizures;Neurology appt on 05/30/12;on Kepra 2000mg  bid;was on Topramax 50mg  tid   Wears glasses    Yeast infection     Assessment: Patient Reported Symptoms:  Cognitive Cognitive Status: Alert and oriented to person, place, and time, Normal speech and language skills Cognitive/Intellectual Conditions Management [RPT]: None reported or documented in medical history or problem list      Neurological Neurological Review of Symptoms: No symptoms reported    HEENT HEENT Symptoms Reported: No symptoms reported      Cardiovascular Cardiovascular Symptoms Reported: No symptoms reported Cardiovascular Conditions: Hypertension  Respiratory Respiratory Symptoms Reported: No symptoms reported Respiratory Conditions:  (ILD)  Endocrine Patient reports the following symptoms related to hypoglycemia or hyperglycemia : No symptoms reported Is patient diabetic?: Yes Endocrine Conditions: Diabetes Endocrine Management Strategies: Routine screening, Medication therapy  Gastrointestinal Gastrointestinal Symptoms Reported: No symptoms reported       Genitourinary Genitourinary Symptoms Reported: No symptoms reported    Integumentary Integumentary Symptoms Reported: No symptoms reported    Musculoskeletal Musculoskelatal Symptoms Reviewed: Difficulty walking, Unsteady gait Additional Musculoskeletal Details: Edema in both feet Musculoskeletal Conditions: Unsteady gait      Psychosocial Psychosocial Symptoms Reported: No symptoms reported Behavioral Health Conditions: Depression, Bipolar affective disorder Behavioral Management Strategies: Coping strategies, Support system Major Change/Loss/Stressor/Fears (CP): Medical condition, self        02/22/2024    8:54 AM  Depression screen PHQ 2/9  Decreased Interest 0  Down, Depressed, Hopeless 0  PHQ - 2 Score 0  Altered sleeping 1  Tired, decreased energy 0  Change in appetite 0  Feeling bad or failure about yourself  0  Trouble concentrating 0  Moving slowly or fidgety/restless 0  Suicidal thoughts 0  PHQ-9 Score 1    There were no vitals filed for this visit.  Medications Reviewed Today     Reviewed by Adriana Albany, LCSW (Social Worker) on 03/06/24 at 1201  Med List Status: <None>   Medication Order Taking? Sig Documenting Provider Last Dose Status Informant  acetaminophen -codeine  (TYLENOL  #3) 300-30 MG tablet [id number] No Take 1 tablet by mouth 2 (two) times daily as needed.  Patient not taking: Reported on 03/02/2024   Sandie Cross, PA-C Not Taking Active   albuterol  (VENTOLIN  HFA) 108 [phone] Base) MCG/ACT inhaler [id number] No Inhale 2 puffs into the lungs every 6 (six) hours as needed for wheezing or shortness of breath. Dulce Gibbs M, PA-C Taking Active   ammonium lactate  (AMLACTIN) 12 % cream [id number] No Apply 1 Application topically as needed  for dry skin. McCaughan, Dia D, DPM Taking Active   atorvastatin  (LIPITOR) 20 MG tablet [id number] No Take 1 tablet (20 mg total) by mouth daily. Dulce Gibbs M, PA-C Taking Active   benzonatate  (TESSALON ) 100 MG  capsule [id number] No Take 1-2 capsules (100-200 mg total) by mouth 3 (three) times daily as needed.  Patient not taking: Reported on 03/02/2024   Newlin, Enobong, MD Not Taking Active   cetirizine  (ZYRTEC  ALLERGY) 10 MG tablet [id number]  Take 1 tablet (10 mg total) by mouth daily for 14 days. Marciana Settle, PA-C  Expired 02/20/24 2359   COVID-19 At-Home Test KIT [id number] No Use to test for COVID. Farris Hong, PA-C Taking Active   diclofenac  (VOLTAREN ) 75 MG EC tablet [id number] No Take 1 tablet (75 mg total) by mouth 2 (two) times daily as needed. Sandie Cross, PA-C Taking Active   fluconazole  (DIFLUCAN ) 150 MG tablet [id number] No Take 1 tablet (150 mg total) by mouth once a week. Dulce Gibbs M, PA-C Taking Active   furosemide  (LASIX ) 20 MG tablet [id number] No Take 1 tablet (20 mg total) by mouth daily. X 5days for swelling Hassie Lint, PA-C Taking Active   gabapentin  (NEURONTIN ) 300 MG capsule [id number] No Take 1 capsule (300 mg total) by mouth 3 (three) times daily. Dulce Gibbs M, PA-C Taking Active   hydrOXYzine  (ATARAX ) 10 MG tablet [id number] No Take 1 tablet (10 mg total) by mouth 3 (three) times daily as needed. Angelia Kelp, PA-C Taking Active   hydrOXYzine  (ATARAX ) 25 MG tablet [id number] No Take 25 mg by mouth 3 (three) times daily. [provider] Taking Active   ketoconazole  (NIZORAL ) 2 % shampoo [id number] No Apply 1 Application topically 2 (two) times a week. X 2 weeks Nellie Banas M, PA-C Taking Active   lacosamide  (VIMPAT ) 50 MG TABS tablet [id number] No Take 1 tablet (50 mg total) by mouth 2 (two) times daily. Merriam Abbey, DO Taking Active   meloxicam  (MOBIC ) 7.5 MG tablet [id number] No Take 1 tablet (7.5 mg total) by mouth daily. Prn pain Dulce Gibbs M, PA-C Taking Active   metFORMIN  (GLUCOPHAGE ) 1000 MG tablet [id number] No Take 1 tablet (1,000 mg total) by mouth 2 (two) times daily with a meal. Hassie Lint, PA-C Taking Active    nystatin  cream (MYCOSTATIN ) [id number] No Apply 1 Application topically 2 (two) times daily. Nellie Banas M, PA-C Taking Active   ondansetron  (ZOFRAN -ODT) 4 MG disintegrating tablet [id number] No Take 1 tablet (4 mg total) by mouth every 8 (eight) hours as needed. Angelia Kelp, PA-C Taking Active   oseltamivir  (TAMIFLU ) 75 MG capsule [id number] No Take 1 capsule (75 mg total) by mouth 2 (two) times daily. Farris Hong, PA-C Taking Active   phenytoin  (DILANTIN ) 100 MG ER capsule [id number] No Take 2 capsules (200 mg total) by mouth 2 (two) times daily. Merriam Abbey, DO Taking Active   promethazine -dextromethorphan (PROMETHAZINE -DM) 6.25-15 MG/5ML syrup [id number] No Take 5 mLs by mouth 4 (four) times daily as needed for cough. Farris Hong, PA-C Taking Active   tirzepatide [facility]) 5 MG/0.5ML Pen [id number] No Inject 5 mg into the skin once a week. Dulce Gibbs M, PA-C Taking Active   tiZANidine  (ZANAFLEX ) 4 MG tablet [id number] No Take by mouth. [provider] Taking Active   topiramate  (TOPAMAX ) 200 MG tablet [id number] No Take 1 tablet (200 mg total) by mouth 2 (two) times daily. Merriam Abbey, DO Taking Active   topiramate  (  TOPAMAX ) 25 MG tablet [id number] No Take 25 mg by mouth daily. [provider] Taking Active             Recommendation:   PCP Follow-up  Follow Up Plan:   Telephone follow-up in 1 month  Alease Hunter, LCSW [facility]  [facility] Clinical Social Worker Direct Dial: [phone]  Fax: [phone] Website: [email or web address] 12:16 PM

## 2024-03-07 ENCOUNTER — Ambulatory Visit: Payer: Self-pay | Admitting: Obstetrics and Gynecology

## 2024-03-07 DIAGNOSIS — R899 Unspecified abnormal finding in specimens from other organs, systems and tissues: Secondary | ICD-10-CM

## 2024-03-07 NOTE — Telephone Encounter (Signed)
-----   Message from Jan Mcgill sent at 03/07/2024  1:01 PM EDT ----- Normal FSH, testosterone , prolactin. Low DHEAS, will obtain am cortisol, needs lab appt to get blood drawn between 6-9 am.

## 2024-03-07 NOTE — Telephone Encounter (Signed)
 Called patient and gave her lab results. She was informed that she needs an AM fasting cortisol level and was scheduled, reviewed to please be at the office by 8:30 for lab to be drawn by 9.   Patient reports she needs to reschedule her pelvic US , she was given number to call radiology to schedule at her convenience.   Patient voiced understanding to all the above.

## 2024-03-15 ENCOUNTER — Other Ambulatory Visit

## 2024-03-15 ENCOUNTER — Other Ambulatory Visit: Payer: Self-pay

## 2024-03-15 DIAGNOSIS — E1165 Type 2 diabetes mellitus with hyperglycemia: Secondary | ICD-10-CM

## 2024-03-16 LAB — BASIC METABOLIC PANEL WITH GFR
BUN/Creatinine Ratio: 20 (ref 9–23)
BUN: 11 mg/dL (ref 6–24)
CO2: 17 mmol/L — ABNORMAL LOW (ref 20–29)
Calcium: 8.8 mg/dL (ref 8.7–10.2)
Chloride: 107 mmol/L — ABNORMAL HIGH (ref 96–106)
Creatinine, Ser: 0.56 mg/dL — ABNORMAL LOW (ref 0.57–1.00)
Glucose: 247 mg/dL — ABNORMAL HIGH (ref 70–99)
Potassium: 4.2 mmol/L (ref 3.5–5.2)
Sodium: 141 mmol/L (ref 134–144)
eGFR: 118 mL/min/{1.73_m2} (ref >59)

## 2024-03-21 ENCOUNTER — Ambulatory Visit: Payer: Self-pay | Admitting: Podiatry

## 2024-03-28 ENCOUNTER — Encounter: Payer: Self-pay | Admitting: Obstetrics and Gynecology

## 2024-03-28 ENCOUNTER — Ambulatory Visit (INDEPENDENT_AMBULATORY_CARE_PROVIDER_SITE_OTHER): Admitting: Obstetrics and Gynecology

## 2024-03-28 ENCOUNTER — Other Ambulatory Visit: Payer: Self-pay | Admitting: Physician Assistant

## 2024-03-28 ENCOUNTER — Other Ambulatory Visit: Payer: Self-pay

## 2024-03-28 VITALS — BP 131/81 | HR 98 | Wt 352.9 lb

## 2024-03-28 DIAGNOSIS — R899 Unspecified abnormal finding in specimens from other organs, systems and tissues: Secondary | ICD-10-CM | POA: Diagnosis not present

## 2024-03-28 DIAGNOSIS — R062 Wheezing: Secondary | ICD-10-CM

## 2024-03-28 DIAGNOSIS — N926 Irregular menstruation, unspecified: Secondary | ICD-10-CM | POA: Diagnosis not present

## 2024-03-28 NOTE — Progress Notes (Signed)
 GYNECOLOGY OFFICE NOTE  History:  43 y.o. [id number] here today for f/u for abnormal uterine bleeding. Still having bleeding and it has clots. She did not have US  done yet. Otherwise, doing fine.    Past Medical History:  Diagnosis Date   Allergy    Anemia    has taken iron supplements in the past   Arthritis    Bipolar affective disorder (HCC)    no meds currently;has been years since taking meds   Carpal tunnel syndrome on both sides    Gestational diabetes    H/O varicella    Hypertension    Pt states she had pre-eclampsia w last pregnancy    Infection    Yeast inf;not frequent   Migraines    would be rx'd Percacet for relief   Pregnancy induced hypertension    Seizures (HCC) 05/20/2011   unsure what started seizures;Neurology appt on 05/30/12;on Kepra 2000mg  bid;was on Topramax 50mg  tid   Wears glasses    Yeast infection     Past Surgical History:  Procedure Laterality Date   CESAREAN SECTION     CESAREAN SECTION N/A 12/26/2012   Procedure: Repeat cesarean section with delivery of baby;  Surgeon: Ana Balling, MD;  Location: [facility];  Service: Obstetrics;  Laterality: N/A;   TONSILLECTOMY     During high school years   WISDOM TOOTH EXTRACTION     All 4 removed     Current Outpatient Medications:    albuterol  (VENTOLIN  HFA) 108 (90 Base) MCG/ACT inhaler, Inhale 2 puffs into the lungs every 6 (six) hours as needed for wheezing or shortness of breath., Disp: 18 g, Rfl: 0   ammonium lactate  (AMLACTIN) 12 % cream, Apply 1 Application topically as needed for dry skin., Disp: 385 g, Rfl: 3   atorvastatin  (LIPITOR) 20 MG tablet, Take 1 tablet (20 mg total) by mouth daily., Disp: 90 tablet, Rfl: 1   diclofenac  (VOLTAREN ) 75 MG EC tablet, Take 1 tablet (75 mg total) by mouth 2 (two) times daily as needed., Disp: 60 tablet, Rfl: 2   fluconazole  (DIFLUCAN ) 150 MG tablet, Take 1 tablet (150 mg total) by mouth once a week., Disp: 4 tablet, Rfl: 0   furosemide  (LASIX ) 20 MG tablet,  Take 1 tablet (20 mg total) by mouth daily. X 5days for swelling, Disp: 15 tablet, Rfl: 3   gabapentin  (NEURONTIN ) 300 MG capsule, Take 1 capsule (300 mg total) by mouth 3 (three) times daily., Disp: 90 capsule, Rfl: 4   hydrOXYzine  (ATARAX ) 10 MG tablet, Take 1 tablet (10 mg total) by mouth 3 (three) times daily as needed., Disp: 30 tablet, Rfl: 0   hydrOXYzine  (ATARAX ) 25 MG tablet, Take 25 mg by mouth 3 (three) times daily., Disp: , Rfl:    ketoconazole  (NIZORAL ) 2 % shampoo, Apply 1 Application topically 2 (two) times a week. X 2 weeks, Disp: 240 mL, Rfl: 0   meloxicam  (MOBIC ) 7.5 MG tablet, Take 1 tablet (7.5 mg total) by mouth daily. Prn pain, Disp: 30 tablet, Rfl: 1   metFORMIN  (GLUCOPHAGE ) 1000 MG tablet, Take 1 tablet (1,000 mg total) by mouth 2 (two) times daily with a meal., Disp: 180 tablet, Rfl: 3   nystatin  cream (MYCOSTATIN ), Apply 1 Application topically 2 (two) times daily., Disp: 60 g, Rfl: 0   ondansetron  (ZOFRAN -ODT) 4 MG disintegrating tablet, Take 1 tablet (4 mg total) by mouth every 8 (eight) hours as needed., Disp: 20 tablet, Rfl: 0   oseltamivir  (TAMIFLU ) 75 MG  capsule, Take 1 capsule (75 mg total) by mouth 2 (two) times daily., Disp: 10 capsule, Rfl: 0   phenytoin  (DILANTIN ) 100 MG ER capsule, Take 2 capsules (200 mg total) by mouth 2 (two) times daily., Disp: 120 capsule, Rfl: 5   promethazine -dextromethorphan (PROMETHAZINE -DM) 6.25-15 MG/5ML syrup, Take 5 mLs by mouth 4 (four) times daily as needed for cough., Disp: 118 mL, Rfl: 0   tirzepatide (MOUNJARO) 5 MG/0.5ML Pen, Inject 5 mg into the skin once a week., Disp: 6 mL, Rfl: 0   tiZANidine  (ZANAFLEX ) 4 MG tablet, Take by mouth., Disp: , Rfl:    topiramate  (TOPAMAX ) 200 MG tablet, Take 1 tablet (200 mg total) by mouth 2 (two) times daily., Disp: 60 tablet, Rfl: 5   topiramate  (TOPAMAX ) 25 MG tablet, Take 25 mg by mouth daily., Disp: , Rfl:    benzonatate  (TESSALON ) 100 MG capsule, Take 1-2 capsules (100-200 mg total) by  mouth 3 (three) times daily as needed. (Patient not taking: Reported on 03/28/2024), Disp: 30 capsule, Rfl: 0   cetirizine  (ZYRTEC  ALLERGY) 10 MG tablet, Take 1 tablet (10 mg total) by mouth daily for 14 days., Disp: 14 tablet, Rfl: 0   COVID-19 At-Home Test KIT, Use to test for COVID., Disp: 1 kit, Rfl: 0   lacosamide  (VIMPAT ) 50 MG TABS tablet, Take 1 tablet (50 mg total) by mouth 2 (two) times daily. (Patient not taking: Reported on 03/28/2024), Disp: 60 tablet, Rfl: 11  The following portions of the patient's history were reviewed and updated as appropriate: allergies, current medications, past family history, past medical history, past social history, past surgical history and problem list.   Review of Systems:  Pertinent items noted in HPI and remainder of comprehensive ROS otherwise negative.   Objective:  Physical Exam BP 131/81 (BP Location: Right Arm, Patient Position: Sitting, Cuff Size: Large)   Pulse 98   Wt (!) 352 lb 14.4 oz (160.1 kg)   SpO2 97%   BMI 66.68 kg/m  CONSTITUTIONAL: Well-developed, well-nourished female in no acute distress.  HENT:  Normocephalic, atraumatic. External right and left ear normal. Oropharynx is clear and moist EYES: Conjunctivae and EOM are normal. Pupils are equal, round, and reactive to light. No scleral icterus.  NECK: Normal range of motion, supple, no masses SKIN: Skin is warm and dry. No rash noted. Not diaphoretic. No erythema. No pallor. NEUROLOGIC: Alert and oriented to person, place, and time. Normal reflexes, muscle tone coordination. No cranial nerve deficit noted. PSYCHIATRIC: Normal mood and affect. Normal behavior. Normal judgment and thought content. CARDIOVASCULAR: Normal heart rate noted RESPIRATORY: Effort normal, no problems with respiration noted ABDOMEN: Soft, no distention noted.   PELVIC: deferred MUSCULOSKELETAL: Normal range of motion. No edema noted.  Labs and Imaging   Assessment & Plan:  1. Abnormal laboratory  test (Primary) - reviewed need for f/u with endocrinology for low DHEAs and concern for possible long term suppression of adrenals, she verbalizes understanding - Cortisol-am, blood - Ambulatory referral to Endocrinology  2. Irregular bleeding - Reviewed again that improvement in A1c will likely improve her bleeding and encouraged improved control of her DM - other labs wnl - rescheduled US  to r/u fibroids - discussed use of lysteda, she has a history of blood clots in legs and neck after termination and is concerned about use, will review at f/u appt   Routine preventative health maintenance measures emphasized. Please refer to After Visit Summary for other counseling recommendations.   Return in about 1 month (around  04/27/2024) for Followup.   Larence Pleas, MD, [facility] Attending Center for [facility])

## 2024-03-29 ENCOUNTER — Telehealth: Payer: Self-pay

## 2024-03-29 ENCOUNTER — Telehealth: Payer: Self-pay | Admitting: Neurology

## 2024-03-29 LAB — CORTISOL-AM, BLOOD: Cortisol - AM: 10 ug/dL (ref 6.2–19.4)

## 2024-03-29 NOTE — Telephone Encounter (Signed)
 Patient advised of Dr.Jaffe note.  I can't write this.  There is no indication for this letter.

## 2024-03-29 NOTE — Telephone Encounter (Signed)
 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 Her complex wants to give her her own dedicated spot, but they need a letter stating what could happen to her in regards to her epilepsy. For example, can get dizzy, forget, etc. She says the letter can be put in [place].

## 2024-03-29 NOTE — Telephone Encounter (Signed)
 Patient is requesting a letter from PCP stating her disability problems for her landlord as there are no more handicap parking spots for her and they need this letter to make her a new one.

## 2024-03-29 NOTE — Telephone Encounter (Signed)
 LVM for patient to return call to schedule a virtual visit to discuss the letter.

## 2024-03-30 ENCOUNTER — Other Ambulatory Visit (HOSPITAL_BASED_OUTPATIENT_CLINIC_OR_DEPARTMENT_OTHER): Admitting: Pharmacist

## 2024-03-30 ENCOUNTER — Encounter: Payer: Self-pay | Admitting: Pharmacist

## 2024-03-30 DIAGNOSIS — E119 Type 2 diabetes mellitus without complications: Secondary | ICD-10-CM

## 2024-03-30 DIAGNOSIS — Z7984 Long term (current) use of oral hypoglycemic drugs: Secondary | ICD-10-CM

## 2024-03-30 DIAGNOSIS — Z7985 Long-term (current) use of injectable non-insulin antidiabetic drugs: Secondary | ICD-10-CM

## 2024-03-30 NOTE — Progress Notes (Signed)
 Pharmacy TNM Diabetes Measure Review  S:  Patient was identified in a report as being at risk for failing the True [place] Metric of A1c control (<8%). Last A1c was 10.6. Last PCP visit was 03/16/2023. Has been seen by our PA a couple of times since then with her most recent visit on 01/26/2024.   Call placed to patient to discuss diabetes control and medication management. I have not seen her before.   She is in good spirits today. Reports that she is doing well. Adherent to her Mounjaro and metformin , however, she endorses nausea and diarrhea since starting it. This is not to a point where she wants to discontinue therapy at this time, however, she wishes to remain on 5 mg weekly.   Current diabetes medications include: metformin  100 mg BID, Mounjaro 5 mg weekly Patient reports adherence to taking all medications as prescribed.   Insurance coverage: Medicare  O: Lab Results  Component Value Date   HGBA1C 10.6 (A) 01/26/2024   There were no vitals filed for this visit.  Lipid Panel  No results found for: CHOL, TRIG, HDL, CHOLHDL, VLDL, LDLCALC, LDLDIRECT  Clinical Atherosclerotic Cardiovascular Disease (ASCVD): No  The ASCVD Risk score (Arnett DK, et al., 2019) failed to calculate for the following reasons:   Cannot find a previous HDL lab   Cannot find a previous total cholesterol lab   Patient is participating in a Managed Medicaid Plan: No   A/P: Diabetes longstanding currently above goal. Patient is able to verbalize appropriate hypoglycemia management plan. Medication adherence appears to be appropriate but she is currently mitigating side effects associated with her DM regimen. She is amenable to seeing me and I have scheduled her for a visit on 04/28/2024 for DM management. Will hold off on any changes until I can see her in person. Of note, she qualifies for LIBERATE depending on next month's A1c. I could also enroll her in hopes that we can improve glycemic  control. -Continued current regimen.  -Patient educated on purpose, proper use, and potential adverse effects of Mounjaro, metformin .  -Extensively discussed pathophysiology of diabetes, recommended lifestyle interventions, dietary effects on blood sugar control.  -Counseled on s/sx of and management of hypoglycemia.  -Next A1c anticipated 04/28/2024.   Follow-up:  Pharmacist 04/28/2024  Marene Shape, PharmD, BCACP, CPP Clinical Pharmacist [facility] [phone]

## 2024-04-03 ENCOUNTER — Other Ambulatory Visit: Payer: Self-pay | Admitting: Licensed Clinical Social Worker

## 2024-04-03 NOTE — Patient Outreach (Signed)
 Complex Care Management   Visit Note  04/03/2024  Name:  [name] [name] MRN: [id number] DOB: 1981/10/01  Situation: Referral received for Complex Care Management related to Mental/Behavioral Health diagnosis Bipolar Affective Disorder and Anxiety I obtained verbal consent from Patient.  Visit completed with pt  on the phone  Background:   Past Medical History:  Diagnosis Date   Allergy    Anemia    has taken iron supplements in the past   Arthritis    Bipolar affective disorder (HCC)    no meds currently;has been years since taking meds   Carpal tunnel syndrome on both sides    Gestational diabetes    H/O varicella    Hypertension    Pt states she had pre-eclampsia w last pregnancy    Infection    Yeast inf;not frequent   Migraines    would be rx'd Percacet for relief   Pregnancy induced hypertension    Seizures (HCC) 05/20/2011   unsure what started seizures;Neurology appt on 05/30/12;on Kepra 2000mg  bid;was on Topramax 50mg  tid   Wears glasses    Yeast infection     Assessment: Patient Reported Symptoms:  Cognitive Cognitive Status: Alert and oriented to person, place, and time, Normal speech and language skills Cognitive/Intellectual Conditions Management [RPT]: None reported or documented in medical history or problem list      Neurological Neurological Review of Symptoms: No symptoms reported    HEENT HEENT Symptoms Reported: No symptoms reported      Cardiovascular Cardiovascular Symptoms Reported: No symptoms reported Does patient have uncontrolled Hypertension?: No Cardiovascular Conditions: Hypertension Cardiovascular Management Strategies: Coping strategies, Medication therapy, Routine screening  Respiratory Respiratory Symptoms Reported: No symptoms reported Respiratory Conditions:  (ILD)  Endocrine Patient reports the following symptoms related to hypoglycemia or hyperglycemia : No symptoms reported Is patient diabetic?: Yes Endocrine Conditions:  Diabetes Endocrine Management Strategies: Medication therapy, Routine screening  Gastrointestinal Gastrointestinal Symptoms Reported: Cramping, Constipation, Abdominal pain or discomfort Additional Gastrointestinal Details: From time to time. It all depends      Genitourinary Genitourinary Symptoms Reported: No symptoms reported    Integumentary Integumentary Symptoms Reported: No symptoms reported    Musculoskeletal Musculoskelatal Symptoms Reviewed: Difficulty walking, Unsteady gait Additional Musculoskeletal Details: Ongoing Edema Musculoskeletal Conditions: Unsteady gait, Carpal tunnel syndrome Musculoskeletal Management Strategies: Coping strategies, Medication therapy, Routine screening Falls in the past year?: Yes Number of falls in past year: 1 or less Was there an injury with Fall?: Yes Fall Risk Category Calculator: 2 Patient Fall Risk Level: Moderate Fall Risk Fall risk Follow up: Falls prevention discussed  Psychosocial Psychosocial Symptoms Reported: No symptoms reported Additional Psychological Details: Pt reports stress from managing chronic health conditions. Self-care encouraged Behavioral Health Conditions: Depression, Bipolar affective disorder Behavioral Management Strategies: Coping strategies, Support system Major Change/Loss/Stressor/Fears (CP): Medical condition, self Techniques to Cope with Loss/Stress/Change: Diversional activities Quality of Family Relationships: involved, supportive Do you feel physically threatened by others?: No      03/28/2024   12:07 PM  Depression screen PHQ 2/9  Decreased Interest 0  Down, Depressed, Hopeless 1  PHQ - 2 Score 1  Tired, decreased energy 1  Change in appetite 1  Feeling bad or failure about yourself  1  Trouble concentrating 0  Moving slowly or fidgety/restless 0  Suicidal thoughts 0    There were no vitals filed for this visit.  Medications Reviewed Today     Reviewed by Adriana Albany, LCSW (Social  Worker) on 04/03/24 at 1051  Med  List Status: <None>   Medication Order Taking? Sig Documenting Provider Last Dose Status Informant  ammonium lactate  (AMLACTIN) 12 % cream [id number] No Apply 1 Application topically as needed for dry skin. McCaughan, Dia D, DPM Taking Active   atorvastatin  (LIPITOR) 20 MG tablet [id number] No Take 1 tablet (20 mg total) by mouth daily. Dulce Gibbs M, PA-C Taking Active   benzonatate  (TESSALON ) 100 MG capsule [id number] No Take 1-2 capsules (100-200 mg total) by mouth 3 (three) times daily as needed.  Patient not taking: Reported on 03/28/2024   Newlin, Enobong, MD Not Taking Active   cetirizine  (ZYRTEC  ALLERGY) 10 MG tablet [id number]  Take 1 tablet (10 mg total) by mouth daily for 14 days. Marciana Settle, PA-C  Expired 02/20/24 2359   COVID-19 At-Home Test KIT [id number] No Use to test for COVID. Farris Hong, PA-C Taking Active   diclofenac  (VOLTAREN ) 75 MG EC tablet [id number] No Take 1 tablet (75 mg total) by mouth 2 (two) times daily as needed. Sandie Cross, PA-C Taking Active   fluconazole  (DIFLUCAN ) 150 MG tablet [id number] No Take 1 tablet (150 mg total) by mouth once a week. Hassie Lint, PA-C Taking Active   furosemide  (LASIX ) 20 MG tablet [id number] No Take 1 tablet (20 mg total) by mouth daily. X 5days for swelling Hassie Lint, PA-C Taking Active   gabapentin  (NEURONTIN ) 300 MG capsule [id number] No Take 1 capsule (300 mg total) by mouth 3 (three) times daily. Dulce Gibbs M, PA-C Taking Active   hydrOXYzine  (ATARAX ) 10 MG tablet [id number] No Take 1 tablet (10 mg total) by mouth 3 (three) times daily as needed. Angelia Kelp, PA-C Taking Active   hydrOXYzine  (ATARAX ) 25 MG tablet [id number] No Take 25 mg by mouth 3 (three) times daily. [provider] Taking Active   ketoconazole  (NIZORAL ) 2 % shampoo [id number] No Apply 1 Application topically 2 (two) times a week. X 2 weeks Nellie Banas M, PA-C Taking Active    lacosamide  (VIMPAT ) 50 MG TABS tablet [id number] No Take 1 tablet (50 mg total) by mouth 2 (two) times daily.  Patient not taking: Reported on 03/28/2024   Merriam Abbey, DO Not Taking Active   meloxicam  (MOBIC ) 7.5 MG tablet [id number] No Take 1 tablet (7.5 mg total) by mouth daily. Prn pain Hassie Lint, PA-C Taking Active   metFORMIN  (GLUCOPHAGE ) 1000 MG tablet [id number] No Take 1 tablet (1,000 mg total) by mouth 2 (two) times daily with a meal. Hassie Lint, PA-C Taking Active   nystatin  cream (MYCOSTATIN ) [id number] No Apply 1 Application topically 2 (two) times daily. Angelia Kelp, PA-C Taking Active   ondansetron  (ZOFRAN -ODT) 4 MG disintegrating tablet [id number] No Take 1 tablet (4 mg total) by mouth every 8 (eight) hours as needed. Angelia Kelp, PA-C Taking Active   oseltamivir  (TAMIFLU ) 75 MG capsule [id number] No Take 1 capsule (75 mg total) by mouth 2 (two) times daily. Farris Hong, PA-C Taking Active   phenytoin  (DILANTIN ) 100 MG ER capsule [id number] No Take 2 capsules (200 mg total) by mouth 2 (two) times daily. Merriam Abbey, DO Taking Active   promethazine -dextromethorphan (PROMETHAZINE -DM) 6.25-15 MG/5ML syrup [id number] No Take 5 mLs by mouth 4 (four) times daily as needed for cough. Farris Hong, PA-C Taking Active   tirzepatide  (MOUNJARO ) 5 MG/0.5ML Pen [id number] No Inject 5 mg into the skin once a week. Hassie Lint, PA-C Taking Active   tiZANidine  (ZANAFLEX ) 4 MG tablet [id number]  No Take by mouth. [provider] Taking Active   topiramate  (TOPAMAX ) 200 MG tablet [id number] No Take 1 tablet (200 mg total) by mouth 2 (two) times daily. Merriam Abbey, DO Taking Active   topiramate  (TOPAMAX ) 25 MG tablet [id number] No Take 25 mg by mouth daily. [provider] Taking Active   VENTOLIN  HFA 108 (90 Base) MCG/ACT inhaler [id number]  INHALE 2 PUFFS INTO THE LUNGS EVERY 6 HOURS AS NEEDED FOR WHEEZING OR SHORTNESS OF BREATH Newlin,  Enobong, MD  Active             Recommendation:   PCP Follow-up Continue Current Plan of Care  Follow Up Plan:   Telephone follow-up in 1 month  Arlis Bent [facility]  [facility], [facility] Clinical Social Worker Direct Dial: [phone]  Fax: [phone] Website: [email or web address] 11:23 AM

## 2024-04-03 NOTE — Patient Instructions (Signed)
 Visit Information  Thank you for taking time to visit with me today. Please don't hesitate to contact me if I can be of assistance to you before our next scheduled appointment.  Your next care management appointment is by telephone on 07/14 at 11 AM  Please call the care guide team at [phone] if you need to cancel, schedule, or reschedule an appointment.   Please call the Suicide and Crisis Lifeline: 988 go to [facility] [address] [place] [id number]) call 911 if you are experiencing a Mental Health or Behavioral Health Crisis or need someone to talk to.  Alease Hunter, LCSW [place]  [facility], [facility] Clinical Social Worker Direct Dial: [phone]  Fax: [phone] Website: [email or web address] 11:23 AM

## 2024-04-04 ENCOUNTER — Ambulatory Visit: Payer: Self-pay | Admitting: Obstetrics and Gynecology

## 2024-04-04 ENCOUNTER — Ambulatory Visit (HOSPITAL_COMMUNITY)
Admission: RE | Admit: 2024-04-04 | Discharge: 2024-04-04 | Disposition: A | Discharge status: Home / Self Care | Source: Ambulatory Visit | Attending: Obstetrics and Gynecology | Admitting: Obstetrics and Gynecology

## 2024-04-04 DIAGNOSIS — N926 Irregular menstruation, unspecified: Secondary | ICD-10-CM | POA: Insufficient documentation

## 2024-04-06 ENCOUNTER — Other Ambulatory Visit: Payer: Self-pay | Admitting: Neurology

## 2024-04-09 ENCOUNTER — Other Ambulatory Visit: Payer: Self-pay | Admitting: Physician Assistant

## 2024-04-09 DIAGNOSIS — E1165 Type 2 diabetes mellitus with hyperglycemia: Secondary | ICD-10-CM

## 2024-04-09 DIAGNOSIS — M25512 Pain in left shoulder: Secondary | ICD-10-CM

## 2024-04-13 ENCOUNTER — Ambulatory Visit

## 2024-04-21 ENCOUNTER — Ambulatory Visit (INDEPENDENT_AMBULATORY_CARE_PROVIDER_SITE_OTHER)

## 2024-04-21 ENCOUNTER — Ambulatory Visit
Admission: EM | Admit: 2024-04-21 | Discharge: 2024-04-21 | Disposition: A | Discharge status: Home / Self Care | Attending: Family Medicine | Admitting: Family Medicine

## 2024-04-21 DIAGNOSIS — R0789 Other chest pain: Secondary | ICD-10-CM | POA: Diagnosis not present

## 2024-04-21 MED ORDER — HYDROXYZINE HCL 25 MG PO TABS: 25.0000 mg | ORAL_TABLET | Freq: Three times a day (TID) | ORAL | 0 refills | Status: AC | PRN

## 2024-04-21 NOTE — Discharge Instructions (Addendum)
 The EKG showed normal rhythm  Your x-ray is read as negative and does not show any fluid or pneumonia.  Hydroxyzine  25 mg--1 every 8 hours as needed for anxiety.  Please follow-up with your primary care about this issue  Also please go to the behavioral health urgent care if your stress or anxiety or worsening or you feel you are in crisis.
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
## 2024-04-28 ENCOUNTER — Ambulatory Visit: Attending: Family Medicine | Admitting: Pharmacist

## 2024-04-28 ENCOUNTER — Telehealth: Payer: Self-pay | Admitting: Pharmacist

## 2024-04-28 ENCOUNTER — Other Ambulatory Visit: Payer: Self-pay | Admitting: Pharmacist

## 2024-04-28 ENCOUNTER — Encounter: Payer: Self-pay | Admitting: Pharmacist

## 2024-04-28 DIAGNOSIS — Z7985 Long-term (current) use of injectable non-insulin antidiabetic drugs: Secondary | ICD-10-CM | POA: Diagnosis not present

## 2024-04-28 DIAGNOSIS — E119 Type 2 diabetes mellitus without complications: Secondary | ICD-10-CM

## 2024-04-28 DIAGNOSIS — E1165 Type 2 diabetes mellitus with hyperglycemia: Secondary | ICD-10-CM

## 2024-04-28 LAB — POCT GLYCOSYLATED HEMOGLOBIN (HGB A1C): HbA1c, POC (controlled diabetic range): 11.3 % — AB (ref 0.0–7.0)

## 2024-04-28 NOTE — Progress Notes (Signed)
    S:     No chief complaint on file.  43 y.o. female who presents for diabetes evaluation, education, and management in the context of the LIBERATE Study. Today, patient arrives in good spirits and presents without any assistance.  Patient was referred and last seen by Jon Moores on 01/26/2024. Of note, she has not seen her PCP since 03/16/2023. I called her on 03/30/2024 as a patient outreach initiative for [facility]. At the time of that call, she was amenable to seeing me.   PMH is significant for T2DM, HTN, hx of acute DVT/bilateral PE, ILD, epilepsy, arthritis, bipolar affective disorder, anxiety.   She presents today with symptoms of mania. Endorses racing thoughts with paranoia of someone in the office watching. She tells me she does not wish to be seen and needs to get out of here. Very anxious and irritable today. I was able to room her and check an A1c. Additionally, while she initially consented for LIBERATE and meets criteria for our study she declines further participation as she is not comfortable with the insertion of the sensor under her skin.   Current diabetes medications include: metformin  1000 mg BID, Mounjaro  5 mg weekly Patient reports adherence to taking all medications as prescribed.   Insurance coverage: Medicare  Patient denies hypoglycemic events.   O:  Lab Results  Component Value Date   HGBA1C 11.3 (A) 04/28/2024    There were no vitals filed for this visit.   Lipid Panel  No results found for: CHOL, TRIG, HDL, CHOLHDL, VLDL, LDLCALC, LDLDIRECT  Clinical Atherosclerotic Cardiovascular Disease (ASCVD): No  The ASCVD Risk score (Arnett DK, et al., 2019) failed to calculate for the following reasons:   Cannot find a previous HDL lab   Cannot find a previous total cholesterol lab   Patient is participating in a Managed Medicaid Plan: No     A/P:  LIBERATE Study:  -Patient declined to provide verbal consent to participate in the  study.   Due to her presentation, diabetes unable to be addressed today. Our practice administrator has seen her and spoken to her. She has a hx of Bipolar disorder and appears to be in acute mania. Above her other concerns, she voices concerns that someone in the clinic is watching her but also that someone followed her on the way to today's office visit. She is afraid to leave the clinic. Practice administrator is working with triage and our supervising physician to determine next steps.   Total time in face to face counseling 10 minutes.    Follow-up:  Pharmacist prn. PCP clinic visit has not been scheduled at this time.   Herlene Fleeta Morris, PharmD, JAQUELINE, CPP Clinical Pharmacist [facility] [phone]

## 2024-04-28 NOTE — Telephone Encounter (Signed)
 Upon arrival in the lobby, the patient immediately expressed concern, stating, "I need you to call the police because someone is after me." I calmly asked the patient to clarify what was happening. She reported feeling "weird" and believed someone was following her.  I, along with another front desk representative, immediately informed our Research officer, political party. After the update, I spoke again with the patient and guided her to a private room to meet with the Practice Administrator for further discussion and support.  After consulting with staff and the scheduled provider, a plan was developed to address the patient's concerns and ensure she felt safe. I remained with the patient as requested and engaged in a supportive conversation. She shared that she simply needed someone to listen to her and appreciated having the space to talk. I reassured her that I was there to support her and encouraged her to take her time.  The patient opened up about issues she was experiencing at home, including tension with neighbors after assisting someone with car trouble. She expressed feeling misunderstood, emphasizing that she's a good person who enjoys helping others. We spoke for approximately 25-30 minutes, during which she expressed that she felt comfortable talking to me and did not wish to speak with anyone else at that time.  Her provider later entered the room to continue care. He asked if he could install an app on her phone to help her track her health results. The patient momentarily became anxious, stating she believed he triggered a car alarm. The provider calmly explained the purpose of the app, and the patient stated she understood and was okay with it.  Once the provider stepped out, I spoke to the patient again about protecting her peace of mind, avoiding distractions, and focusing on her well-being. She responded positively and said she was ready to leave. I offered to walk her out to ensure her  safety.  As we exited, the patient stopped and said she needed to sit down. I accommodated her, and she sat in the lobby for about five minutes, during which I updated the staff on the situation. When I returned to check on her, she said she was ready to leave and began discussing her next appointment with Zara at the front desk. She expressed interest in following up with her previous PCP and asked about scheduling.  Outside, the patient became concerned again, pointing out a black vehicle in the parking lot that she believed was there for her. I explained that it belonged to a Hydrographic surveyor and offered to bring the officer over if that would make her feel safer. She declined but remained outside as we revisited our earlier conversation about maintaining a positive mindset. She agreed with the advice and thanked me for listening.  Before leaving, I reminded her that we are always available if she needs someone to talk to. I also informed her about additional community resources that may be helpful. She reassured me that she was not a danger to herself or others and emphasized that she just needed someone to listen. I was grateful to be there to support both the patient and staff during this situation.

## 2024-05-01 ENCOUNTER — Encounter: Payer: Self-pay | Admitting: Licensed Clinical Social Worker

## 2024-05-01 ENCOUNTER — Telehealth: Payer: Self-pay | Admitting: Licensed Clinical Social Worker

## 2024-05-10 ENCOUNTER — Ambulatory Visit: Admitting: Obstetrics and Gynecology

## 2024-05-10 NOTE — Progress Notes (Deleted)
    GYNECOLOGY VISIT  Patient name: [name] [name] MRN [id number]  Date of birth: 06-18-1981 Chief Complaint:   No chief complaint on file.   History:  [name] [name] is a 43 y.o. G2P2002 being seen today for ***.    Last seen 03/28/24 (MD): presented with AUB. 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 Pelvic US  oredered. X of   Past Medical History:  Diagnosis Date  . Allergy   . Anemia    has taken iron supplements in the past  . Arthritis   . Bipolar affective disorder (HCC)    no meds currently;has been years since taking meds  . Carpal tunnel syndrome on both sides   . Gestational diabetes   . H/O varicella   . Hypertension    Pt states she had pre-eclampsia w last pregnancy   . Infection    Yeast inf;not frequent  . Migraines    would be rx'd Percacet for relief  . Pregnancy induced hypertension   . Seizures (HCC) 05/20/2011   unsure what started seizures;Neurology appt on 05/30/12;on Kepra 2000mg  bid;was on Topramax 50mg  tid  . Wears glasses   . Yeast infection     Past Surgical History:  Procedure Laterality Date  . CESAREAN SECTION    . CESAREAN SECTION N/A 12/26/2012   Procedure: Repeat cesarean section with delivery of baby;  Surgeon: [name] JAYSON Birkenhead, MD;  Location: [facility];  Service: Obstetrics;  Laterality: N/A;  . TONSILLECTOMY     During high school years  . WISDOM TOOTH EXTRACTION     All 4 removed    The following portions of the patient's history were reviewed and updated as appropriate: allergies, current medications, past family history, past medical history, past social history, past surgical history and problem list.   Health Maintenance:   Last pap ***. Results were: {Pap findings:25134}. H/O abnormal pap: {yes/yes***/no:23866} Last mammogram: ***. Results were: {normal, abnormal, n/a:23837}. Family h/o breast cancer: {yes***/no:23838}   Review of Systems:  {Ros - complete:30496} Comprehensive review of systems was otherwise negative.   Objective:  Physical  Exam LMP 04/03/2024 (Approximate)    Physical Exam   Labs and Imaging Pelvic US  (03/2024) FINDINGS: Uterus   Measurements: 10.4 x 6 x 7.1 cm = volume: 232.3 mL. No fibroids or other mass visualized.   Endometrium   Thickness: 15.1 mm.  No focal abnormality visualized.   Right ovary   Not seen   Left ovary   Measurements: 3.7 x 2.3 x 2.8 cm = volume: 12.3 mL. Normal appearance/no adnexal mass.   Other findings   No abnormal free fluid.   IMPRESSION: Nonvisualized right ovary. Otherwise negative pelvic ultrasound.     Assessment & Plan:   There are no diagnoses linked to this encounter.   *** Routine preventative health maintenance measures emphasized.  Carter Quarry, MD [facility], [facility]

## 2024-05-11 ENCOUNTER — Ambulatory Visit: Admitting: Pulmonary Disease

## 2024-05-11 ENCOUNTER — Encounter: Payer: Self-pay | Admitting: Pulmonary Disease

## 2024-05-12 ENCOUNTER — Telehealth: Payer: Self-pay | Admitting: Family Medicine

## 2024-05-12 NOTE — Telephone Encounter (Signed)
 Pt drop off paperwork

## 2024-05-15 ENCOUNTER — Telehealth: Payer: Self-pay | Admitting: Neurology

## 2024-05-15 DIAGNOSIS — E1165 Type 2 diabetes mellitus with hyperglycemia: Secondary | ICD-10-CM

## 2024-05-15 NOTE — Telephone Encounter (Signed)
 1. Which medications need refilled? (List name and dosage, if known) dilantin  - she is out  2. Which pharmacy/location is medication to be sent to? (include street and city if local pharmacy) [facility] [place]

## 2024-05-16 ENCOUNTER — Telehealth: Admitting: Physician Assistant

## 2024-05-16 ENCOUNTER — Ambulatory Visit: Payer: Medicare Other | Attending: Family Medicine

## 2024-05-16 ENCOUNTER — Other Ambulatory Visit: Payer: Self-pay | Admitting: Neurology

## 2024-05-16 VITALS — Ht 61.0 in | Wt 342.0 lb

## 2024-05-16 DIAGNOSIS — B379 Candidiasis, unspecified: Secondary | ICD-10-CM

## 2024-05-16 DIAGNOSIS — Z0000 Encounter for general adult medical examination without abnormal findings: Secondary | ICD-10-CM

## 2024-05-16 DIAGNOSIS — N898 Other specified noninflammatory disorders of vagina: Secondary | ICD-10-CM

## 2024-05-16 MED ORDER — PHENYTOIN SODIUM EXTENDED 100 MG PO CAPS: 200.0000 mg | ORAL_CAPSULE | Freq: Two times a day (BID) | ORAL | 5 refills | Status: DC

## 2024-05-16 MED ORDER — FLUCONAZOLE 150 MG PO TABS: ORAL_TABLET | ORAL | 0 refills | Status: AC

## 2024-05-16 MED ORDER — MOUNJARO 5 MG/0.5ML ~~LOC~~ SOAJ: 5.0000 mg | SUBCUTANEOUS | 1 refills | Status: AC

## 2024-05-16 NOTE — Telephone Encounter (Signed)
 Medications requested were already sent to her pharmacy and she needs to contact them.  Her med list reveals she is on Mounjaro  not Wegovy and I have sent prescription for Mounjaro  to her pharmacy.  She is due for an office visit with me.  Thank you.

## 2024-05-16 NOTE — Patient Instructions (Signed)
 [name] [name], thank you for joining [name], PA-C for today's virtual visit.  While this provider is not your primary care provider (PCP), if your PCP is located in our provider database this encounter information will be shared with them immediately following your visit.   A [facility] MyChart account gives you access to today's visit and all your visits, tests, and labs performed at [facility]  click here if you don't have a [facility] MyChart account or go to [email or web address]  Consent: (Patient) [name] [name] provided verbal consent for this virtual visit at the beginning of the encounter.  Current Medications:  Current Outpatient Medications:    fluconazole  (DIFLUCAN ) 150 MG tablet, Take one tablet by mouth x 1 day. May repeat in 72 hours if symptoms don't improve., Disp: 2 tablet, Rfl: 0   ammonium lactate  (AMLACTIN) 12 % cream, Apply 1 Application topically as needed for dry skin., Disp: 385 g, Rfl: 3   atorvastatin  (LIPITOR) 20 MG tablet, Take 1 tablet (20 mg total) by mouth daily., Disp: 90 tablet, Rfl: 1   cetirizine  (ZYRTEC  ALLERGY) 10 MG tablet, Take 1 tablet (10 mg total) by mouth daily for 14 days., Disp: 14 tablet, Rfl: 0   COVID-19 At-Home Test KIT, Use to test for COVID., Disp: 1 kit, Rfl: 0   diclofenac  (VOLTAREN ) 75 MG EC tablet, Take 1 tablet (75 mg total) by mouth 2 (two) times daily as needed., Disp: 60 tablet, Rfl: 2   furosemide  (LASIX ) 20 MG tablet, Take 1 tablet (20 mg total) by mouth daily. X 5days for swelling, Disp: 15 tablet, Rfl: 3   gabapentin  (NEURONTIN ) 300 MG capsule, Take 1 capsule (300 mg total) by mouth 3 (three) times daily., Disp: 90 capsule, Rfl: 4   hydrOXYzine  (ATARAX ) 25 MG tablet, Take 1 tablet (25 mg total) by mouth every 8 (eight) hours as needed for anxiety., Disp: 12 tablet, Rfl: 0   ibuprofen  (ADVIL ) 800 MG tablet, Take 800 mg by mouth every 6 (six) hours as needed., Disp: , Rfl:    ketoconazole  (NIZORAL ) 2 %  shampoo, Apply 1 Application topically 2 (two) times a week. X 2 weeks, Disp: 240 mL, Rfl: 0   lacosamide  (VIMPAT ) 50 MG TABS tablet, Take 1 tablet (50 mg total) by mouth 2 (two) times daily. (Patient not taking: Reported on 03/28/2024), Disp: 60 tablet, Rfl: 11   lamoTRIgine (LAMICTAL XR) 100 MG 24 hour tablet, Take 100 mg by mouth daily., Disp: , Rfl:    meloxicam  (MOBIC ) 7.5 MG tablet, TAKE 1 TABLET(7.5 MG) BY MOUTH DAILY AS NEEDED FOR PAIN, Disp: 30 tablet, Rfl: 1   metFORMIN  (GLUCOPHAGE ) 1000 MG tablet, Take 1 tablet (1,000 mg total) by mouth 2 (two) times daily with a meal., Disp: 180 tablet, Rfl: 3   nystatin  cream (MYCOSTATIN ), Apply 1 Application topically 2 (two) times daily., Disp: 60 g, Rfl: 0   ondansetron  (ZOFRAN -ODT) 4 MG disintegrating tablet, Take 1 tablet (4 mg total) by mouth every 8 (eight) hours as needed., Disp: 20 tablet, Rfl: 0   phenytoin  (DILANTIN ) 100 MG ER capsule, Take 2 capsules (200 mg total) by mouth 2 (two) times daily., Disp: 120 capsule, Rfl: 5   tirzepatide  (MOUNJARO ) 5 MG/0.5ML Pen, Inject 5 mg into the skin once a week., Disp: 6 mL, Rfl: 1   tiZANidine  (ZANAFLEX ) 4 MG tablet, Take by mouth., Disp: , Rfl:    topiramate  (TOPAMAX ) 200 MG tablet, TAKE 1 TABLET(200 MG) BY MOUTH TWICE DAILY, Disp: 60 tablet, Rfl:  5   topiramate  (TOPAMAX ) 25 MG tablet, Take 25 mg by mouth daily., Disp: , Rfl:    VENTOLIN  HFA 108 (90 Base) MCG/ACT inhaler, INHALE 2 PUFFS INTO THE LUNGS EVERY 6 HOURS AS NEEDED FOR WHEEZING OR SHORTNESS OF BREATH, Disp: 18 g, Rfl: 0   Medications ordered in this encounter:  Meds ordered this encounter  Medications   fluconazole  (DIFLUCAN ) 150 MG tablet    Sig: Take one tablet by mouth x 1 day. May repeat in 72 hours if symptoms don't improve.    Dispense:  2 tablet    Refill:  0    Supervising Provider:   LAMPTEY, PHILIP O [[id number]]     *If you need refills on other medications prior to your next appointment, please contact your  pharmacy*  Follow-Up: Call back or seek an in-person evaluation if the symptoms worsen or if the condition fails to improve as anticipated.  [facility] [phone]  Other Instructions Start medicine. Continue to watch for worsening symptoms. Schedule a virtual appointment or follow up at an urgent care clinic if symptoms don't improve.   If you have been instructed to have an in-person evaluation today at a local Urgent Care facility, please use the link below. It will take you to a list of all of our available [facility], including address, phone number and hours of operation. Please do not delay care.  [facility]  If you or a family member do not have a primary care provider, use the link below to schedule a visit and establish care. When you choose a [facility] primary care physician or advanced practice provider, you gain a long-term partner in health. Find a Primary Care Provider  Learn more about [facility]'s in-office and virtual care options: [employer] - Get Care Now

## 2024-05-16 NOTE — Patient Instructions (Signed)
 [name] [name] , Thank you for taking time out of your busy schedule to complete your Annual Wellness Visit with me. I enjoyed our conversation and look forward to speaking with you again next year. I, as well as your care team,  appreciate your ongoing commitment to your health goals. Please review the following plan we discussed and let me know if I can assist you in the future. Your Game plan/ To Do List    Referrals: If you haven't heard from the office you've been referred to, please reach out to them at the phone provided.  Message sent to Dr. Newlin regarding a referral to Nutritionist. Follow up Visits: Next Medicare AWV with our clinical staff: 05/22/2024 at 11:50 a.m. phone visit with Nurse Health Advisor   Have you seen your provider in the last 6 months (3 months if uncontrolled diabetes)? Yes Next Office Visit with your provider: patient will call to schedule in 3 months  Clinician Recommendations:  Aim for 30 minutes of exercise or brisk walking, 6-8 glasses of water, and 5 servings of fruits and vegetables each day.       This is a list of the screening recommended for you and due dates:  Health Maintenance  Topic Date Due   COVID-19 Vaccine (1) Never done   Complete foot exam   Never done   Eye exam for diabetics  Never done   DTaP/Tdap/Td vaccine (1 - Tdap) Never done   Pneumococcal Vaccination (1 of 2 - PCV) Never done   Hepatitis B Vaccine (1 of 3 - 19+ 3-dose series) Never done   HPV Vaccine (1 - 3-dose SCDM series) Never done   Yearly kidney health urinalysis for diabetes  03/15/2024   Flu Shot  05/19/2024   Hemoglobin A1C  10/29/2024   Yearly kidney function blood test for diabetes  03/15/2025   Medicare Annual Wellness Visit  05/16/2025   Pap with HPV screening  12/15/2026   Hepatitis C Screening  Completed   HIV Screening  Completed   Meningitis B Vaccine  Aged Out    Advanced directives: (Declined) Advance directive discussed with you today. Even though you  declined this today, please call our office should you change your mind, and we can give you the proper paperwork for you to fill out. Advance Care Planning is important because it:  [x]  Makes sure you receive the medical care that is consistent with your values, goals, and preferences  [x]  It provides guidance to your family and loved ones and reduces their decisional burden about whether or not they are making the right decisions based on your wishes.  Follow the link provided in your after visit summary or read over the paperwork we have mailed to you to help you started getting your Advance Directives in place. If you need assistance in completing these, please reach out to us  so that we can help you!  See attachments for Preventive Care and Fall Prevention Tips.

## 2024-05-16 NOTE — Telephone Encounter (Signed)
 Patient stated during AWV that she needed the following rx sent to [facility]: Wegovy, Gabapentin , Atorvastatin , Dilantin  and Topamax .  Advised patient I will send request to PCP.  Burman Bruington N. Tomie, LPN [facility] Annual Wellness Team Direct Dial: [phone]

## 2024-05-16 NOTE — Telephone Encounter (Signed)
 done
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
## 2024-05-16 NOTE — Progress Notes (Addendum)
 Because this visit was a virtual/telehealth visit,  certain criteria was not obtained, such a blood pressure, CBG if applicable, and timed get up and go. Any medications not marked as taking were not mentioned during the medication reconciliation part of the visit. Any vitals not documented were not able to be obtained due to this being a telehealth visit or patient was unable to self-report a recent blood pressure reading due to a lack of equipment at home via telehealth. Vitals that have been documented are verbally provided by the patient.   Subjective:   [name] [name] is a 43 y.o. who presents for a Medicare Wellness preventive visit.  As a reminder, Annual Wellness Visits don't include a physical exam, and some assessments may be limited, especially if this visit is performed virtually. We may recommend an in-person follow-up visit with your provider if needed.  Visit Complete: Virtual I connected with  [name] on 05/16/24 by a audio enabled telemedicine application and verified that I am speaking with the correct person using two identifiers.  Patient Location: Home  Provider Location: Home Office  I discussed the limitations of evaluation and management by telemedicine. The patient expressed understanding and agreed to proceed.  Vital Signs: Because this visit was a virtual/telehealth visit, some criteria may be missing or patient reported. Any vitals not documented were not able to be obtained and vitals that have been documented are patient reported.  VideoDeclined- This patient declined Librarian, academic. Therefore the visit was completed with audio only.  Persons Participating in Visit: Patient.  AWV Questionnaire: No: Patient Medicare AWV questionnaire was not completed prior to this visit.  Cardiac Risk Factors include: diabetes mellitus;dyslipidemia;family history of premature cardiovascular disease;hypertension;obesity (BMI  >30kg/m2);sedentary lifestyle     Objective:    Today's Vitals   05/16/24 1113  Weight: (!) 342 lb (155.1 kg)  Height: 5' 1 (1.549 m)  PainSc: 0-No pain   Body mass index is 64.62 kg/m.     05/16/2024   11:26 AM 11/19/2023   10:25 AM 05/12/2023    8:50 PM 06/16/2022    2:48 PM 03/11/2022    9:56 AM 10/21/2021    9:26 AM 10/19/2021   11:15 AM  Advanced Directives  Does Patient Have a Medical Advance Directive? No No No No No No No  Would patient like information on creating a medical advance directive? No - Patient declined  Yes (MAU/Ambulatory/Procedural Areas - Information given)  No - Patient declined  No - Patient declined    Current Medications (verified) Outpatient Encounter Medications as of 05/16/2024  Medication Sig   ammonium lactate  (AMLACTIN) 12 % cream Apply 1 Application topically as needed for dry skin.   atorvastatin  (LIPITOR) 20 MG tablet Take 1 tablet (20 mg total) by mouth daily.   cetirizine  (ZYRTEC  ALLERGY) 10 MG tablet Take 1 tablet (10 mg total) by mouth daily for 14 days.   COVID-19 At-Home Test KIT Use to test for COVID.   diclofenac  (VOLTAREN ) 75 MG EC tablet Take 1 tablet (75 mg total) by mouth 2 (two) times daily as needed.   fluconazole  (DIFLUCAN ) 150 MG tablet Take 1 tablet (150 mg total) by mouth once a week.   furosemide  (LASIX ) 20 MG tablet Take 1 tablet (20 mg total) by mouth daily. X 5days for swelling   gabapentin  (NEURONTIN ) 300 MG capsule Take 1 capsule (300 mg total) by mouth 3 (three) times daily.   hydrOXYzine  (ATARAX ) 25 MG tablet Take 1 tablet (  25 mg total) by mouth every 8 (eight) hours as needed for anxiety.   ibuprofen  (ADVIL ) 800 MG tablet Take 800 mg by mouth every 6 (six) hours as needed.   ketoconazole  (NIZORAL ) 2 % shampoo Apply 1 Application topically 2 (two) times a week. X 2 weeks   lacosamide  (VIMPAT ) 50 MG TABS tablet Take 1 tablet (50 mg total) by mouth 2 (two) times daily. (Patient not taking: Reported on 03/28/2024)    lamoTRIgine (LAMICTAL XR) 100 MG 24 hour tablet Take 100 mg by mouth daily.   meloxicam  (MOBIC ) 7.5 MG tablet TAKE 1 TABLET(7.5 MG) BY MOUTH DAILY AS NEEDED FOR PAIN   metFORMIN  (GLUCOPHAGE ) 1000 MG tablet Take 1 tablet (1,000 mg total) by mouth 2 (two) times daily with a meal.   MOUNJARO  5 MG/0.5ML Pen ADMINISTER 5 MG UNDER THE SKIN 1 TIME A WEEK   nystatin  cream (MYCOSTATIN ) Apply 1 Application topically 2 (two) times daily.   ondansetron  (ZOFRAN -ODT) 4 MG disintegrating tablet Take 1 tablet (4 mg total) by mouth every 8 (eight) hours as needed.   phenytoin  (DILANTIN ) 100 MG ER capsule Take 2 capsules (200 mg total) by mouth 2 (two) times daily.   tiZANidine  (ZANAFLEX ) 4 MG tablet Take by mouth.   topiramate  (TOPAMAX ) 200 MG tablet TAKE 1 TABLET(200 MG) BY MOUTH TWICE DAILY   topiramate  (TOPAMAX ) 25 MG tablet Take 25 mg by mouth daily.   VENTOLIN  HFA 108 (90 Base) MCG/ACT inhaler INHALE 2 PUFFS INTO THE LUNGS EVERY 6 HOURS AS NEEDED FOR WHEEZING OR SHORTNESS OF BREATH   No facility-administered encounter medications on file as of 05/16/2024.    Allergies (verified) Levetiracetam  and Tramadol   History: Past Medical History:  Diagnosis Date   Allergy    Anemia    has taken iron supplements in the past   Arthritis    Bipolar affective disorder (HCC)    no meds currently;has been years since taking meds   Carpal tunnel syndrome on both sides    Gestational diabetes    H/O varicella    Hypertension    Pt states she had pre-eclampsia w last pregnancy    Infection    Yeast inf;not frequent   Migraines    would be rx'd Percacet for relief   Pregnancy induced hypertension    Seizures (HCC) 05/20/2011   unsure what started seizures;Neurology appt on 05/30/12;on Kepra 2000mg  bid;was on Topramax 50mg  tid   Wears glasses    Yeast infection    Past Surgical History:  Procedure Laterality Date   CESAREAN SECTION     CESAREAN SECTION N/A 12/26/2012   Procedure: Repeat cesarean section  with delivery of baby;  Surgeon: Harland JAYSON Birkenhead, MD;  Location: [facility];  Service: Obstetrics;  Laterality: N/A;   TONSILLECTOMY     During high school years   WISDOM TOOTH EXTRACTION     All 4 removed   Family History  Problem Relation Age of Onset   Cancer Mother        Ovarian   Diabetes Mother    Hypertension Father    Cirrhosis Father    Arthritis Father    Obesity Father    Hypertension Brother    Obesity Brother    Asthma Son    Migraines Daughter    Anxiety disorder Daughter    Depression Daughter    Social History   Socioeconomic History   Marital status: Single    Spouse name: Not on file   Number of  children: 1   Years of education: 13   Highest education level: Master's degree (e.g., MA, MS, MEng, MEd, MSW, MBA)  Occupational History    Comment: Pt is disabled  Tobacco Use   Smoking status: Never    Passive exposure: Never   Smokeless tobacco: Never  Vaping Use   Vaping status: Never Used  Substance and Sexual Activity   Alcohol use: No   Drug use: No   Sexual activity: Not Currently    Partners: Male    Birth control/protection: Abstinence, None    Comment: undecided on birth control  method  Other Topics Concern   Not on file  Social History Narrative   Not on file   Social Drivers of Health   Financial Resource Strain: Medium Risk (05/16/2024)   Overall Financial Resource Strain (CARDIA)    Difficulty of Paying Living Expenses: Somewhat hard  Food Insecurity: Food Insecurity Present (05/16/2024)   Hunger Vital Sign    Worried About Running Out of Food in the Last Year: Sometimes true    Ran Out of Food in the Last Year: Never true  Transportation Needs: No Transportation Needs (05/16/2024)   PRAPARE - Administrator, Civil Service (Medical): No    Lack of Transportation (Non-Medical): No  Recent Concern: Transportation Needs - Unmet Transportation Needs (03/28/2024)   PRAPARE - Transportation    Lack of Transportation (Medical): Yes     Lack of Transportation (Non-Medical): Yes  Physical Activity: Inactive (05/16/2024)   Exercise Vital Sign    Days of Exercise per Week: 0 days    Minutes of Exercise per Session: 0 min  Stress: Stress Concern Present (05/16/2024)   [employer] of Occupational Health - Occupational Stress Questionnaire    Feeling of Stress: Rather much  Social Connections: Moderately Isolated (05/16/2024)   Social Connection and Isolation Panel    Frequency of Communication with Friends and Family: More than three times a week    Frequency of Social Gatherings with Friends and Family: Twice a week    Attends Religious Services: 1 to 4 times per year    Active Member of [employer] or Organizations: No    Attends Engineer, structural: Never    Marital Status: Never married    Tobacco Counseling Counseling given: Not Answered    Clinical Intake:  Pre-visit preparation completed: Yes  Pain : No/denies pain Pain Score: 0-No pain     BMI - recorded: 64.62 Nutritional Status: BMI > 30  Obese Nutritional Risks: None Diabetes: Yes CBG done?: No Did pt. bring in CBG monitor from home?: No  Lab Results  Component Value Date   HGBA1C 11.3 (A) 04/28/2024   HGBA1C 10.6 (A) 01/26/2024   HGBA1C 10.5 (A) 10/07/2023     How often do you need to have someone help you when you read instructions, pamphlets, or other written materials from your doctor or pharmacy?: 1 - Never  Interpreter Needed?: No  Information entered by :: Tameka Hoiland N. Ercilia Bettinger, LPN.   Activities of Daily Living     05/16/2024   11:27 AM  In your present state of health, do you have any difficulty performing the following activities:  Hearing? 0  Vision? 0  Difficulty concentrating or making decisions? 1  Comment BSE: READING, PUZZLES  Walking or climbing stairs? 0  Dressing or bathing? 0  Doing errands, shopping? 0  Preparing Food and eating ? N  Using the Toilet? N  In the past six months,  have you accidently  leaked urine? Y  Do you have problems with loss of bowel control? N  Managing your Medications? N  Managing your Finances? N  Housekeeping or managing your Housekeeping? N    Patient Care Team: Delbert Clam, MD as PCP - General (Family Medicine) Ezzard Rolin BIRCH, LCSW as VBCI Care Management (Licensed Clinical Social Worker) Ezzard Rolin BIRCH, LCSW (Licensed Clinical Social Worker) Claudene Muskrat, OD (Optometry)  I have updated your Care Teams any recent Medical Services you may have received from other providers in the past year.     Assessment:   This is a routine wellness examination for [name] [name].  Hearing/Vision screen Hearing Screening - Comments:: Denies hearing difficulties.   Vision Screening - Comments:: Wears rx glasses - up to date with routine eye exams with Muskrat Claudene, OD.    Goals Addressed             This Visit's Progress    05/16/2024: To get back on track, to take care of me and feel better.         Depression Screen     05/16/2024   11:28 AM 03/28/2024   12:07 PM 02/22/2024    8:54 AM 01/26/2024   10:51 AM 05/12/2023    8:48 PM 03/16/2023    3:50 PM 07/06/2022    2:22 PM  PHQ 2/9 Scores  PHQ - 2 Score 1 1 0 1 0 0 2  PHQ- 9 Score 7  1 1  0 0 4    Fall Risk     05/16/2024   11:16 AM 04/03/2024   10:58 AM 11/19/2023   10:25 AM 10/07/2023    3:45 PM 05/12/2023    8:50 PM  Fall Risk   Falls in the past year? 1 1 0 0 0  Number falls in past yr: 1 0 0 0 0  Injury with Fall? 1 1 0 0 0  Risk for fall due to : History of fall(s);Impaired balance/gait;Orthopedic patient   No Fall Risks No Fall Risks  Follow up Falls evaluation completed;Education provided Falls prevention discussed Falls evaluation completed Falls evaluation completed Falls prevention discussed;Education provided;Falls evaluation completed    MEDICARE RISK AT HOME:  Medicare Risk at Home Any stairs in or around the home?: No If so, are there any without handrails?: No Home free of loose  throw rugs in walkways, pet beds, electrical cords, etc?: Yes Adequate lighting in your home to reduce risk of falls?: Yes Life alert?: No Use of a cane, walker or w/c?: No Grab bars in the bathroom?: No (PATIENT NEEDS HELP WITH BATHING) Shower chair or bench in shower?: No Elevated toilet seat or a handicapped toilet?: No  TIMED UP AND GO:  Was the test performed?  No  Cognitive Function: Declined/Normal: No cognitive concerns noted by patient or family. Patient alert, oriented, able to answer questions appropriately and recall recent events. No signs of memory loss or confusion.    05/16/2024   11:28 AM  MMSE - Mini Mental State Exam  Not completed: Unable to complete        05/16/2024   11:25 AM 05/12/2023    8:50 PM  6CIT Screen  What Year? 0 points 0 points  What month? 0 points 0 points  What time? 0 points 0 points  Count back from 20 0 points 0 points  Months in reverse 0 points 0 points  Repeat phrase 0 points 0 points  Total Score 0 points 0 points  Immunizations There is no immunization history for the selected administration types on file for this patient.  Screening Tests Health Maintenance  Topic Date Due   COVID-19 Vaccine (1) Never done   FOOT EXAM  Never done   OPHTHALMOLOGY EXAM  Never done   DTaP/Tdap/Td (1 - Tdap) Never done   Pneumococcal Vaccine 81-35 Years old (1 of 2 - PCV) Never done   Hepatitis B Vaccines (1 of 3 - 19+ 3-dose series) Never done   HPV VACCINES (1 - 3-dose SCDM series) Never done   Diabetic kidney evaluation - Urine ACR  03/15/2024   INFLUENZA VACCINE  05/19/2024   HEMOGLOBIN A1C  10/29/2024   Diabetic kidney evaluation - eGFR measurement  03/15/2025   Medicare Annual Wellness (AWV)  05/16/2025   Cervical Cancer Screening (HPV/Pap Cotest)  12/15/2026   Hepatitis C Screening  Completed   HIV Screening  Completed   Meningococcal B Vaccine  Aged Out    Health Maintenance  Health Maintenance Due  Topic Date Due    COVID-19 Vaccine (1) Never done   FOOT EXAM  Never done   OPHTHALMOLOGY EXAM  Never done   DTaP/Tdap/Td (1 - Tdap) Never done   Pneumococcal Vaccine 49-88 Years old (1 of 2 - PCV) Never done   Hepatitis B Vaccines (1 of 3 - 19+ 3-dose series) Never done   HPV VACCINES (1 - 3-dose SCDM series) Never done   Diabetic kidney evaluation - Urine ACR  03/15/2024   Health Maintenance Items Addressed: Yes Patient is aware of current care gaps.  Patient is due for a Diabetic Foot Exam, Urine ACR.  Eye exam was completed.  Additional Screening:  Vision Screening: Recommended annual ophthalmology exams for early detection of glaucoma and other disorders of the eye. Would you like a referral to an eye doctor? No    Dental Screening: Recommended annual dental exams for proper oral hygiene  Community Resource Referral / Chronic Care Management: CRR required this visit?  No   CCM required this visit?  No   Plan:    I have personally reviewed and noted the following in the patient's chart:   Medical and social history Use of alcohol, tobacco or illicit drugs  Current medications and supplements including opioid prescriptions. Patient is not currently taking opioid prescriptions. Functional ability and status Nutritional status Physical activity Advanced directives List of other physicians Hospitalizations, surgeries, and ER visits in previous 12 months Vitals Screenings to include cognitive, depression, and falls Referrals and appointments  In addition, I have reviewed and discussed with patient certain preventive protocols, quality metrics, and best practice recommendations. A written personalized care plan for preventive services as well as general preventive health recommendations were provided to patient.   Roz LOISE Fuller, LPN   2/70/7974   After Visit Summary: (MyChart) Due to this being a telephonic visit, the after visit summary with patients personalized plan was offered to  patient via MyChart   Notes: Patient is aware of current care gaps.  Patient is due for a Diabetic Foot Exam, Urine ACR.  Eye exam was completed.

## 2024-05-25 ENCOUNTER — Telehealth: Payer: Self-pay

## 2024-05-25 NOTE — Telephone Encounter (Signed)
 Patient was called and no VM is set up to leave a message.  PCP states that patient does not qualify for a handicap sticker.

## 2024-06-08 ENCOUNTER — Telehealth: Payer: Self-pay | Admitting: *Deleted

## 2024-06-08 NOTE — Progress Notes (Signed)
 Complex Care Management Care Guide Note  06/08/2024 Name: [name] [name] MRN: [id number] DOB: [birth year]  [name] [name] is a 43 y.o. year old female who is a primary care patient of Newlin, Enobong, MD and is actively engaged with the care management team. I reached out to [name] by phone today to assist with re-scheduling  with the Licensed Clinical Child psychotherapist.  Follow up plan: Unsuccessful telephone outreach attempt made. A HIPAA compliant phone message was left for the patient providing contact information and requesting a return call.  Harlene Satterfield  [facility]  [facility], [facility] Guide  Direct Dial: [phone]  Fax [phone]

## 2024-06-15 ENCOUNTER — Ambulatory Visit

## 2024-06-15 ENCOUNTER — Telehealth: Payer: Self-pay | Admitting: Family Medicine

## 2024-06-15 NOTE — Progress Notes (Signed)
 Complex Care Management Care Guide Note  06/15/2024 Name: [name] [name] MRN: [id number] DOB: December 27, 1980  [name] [name] is a 43 y.o. year old female who is a primary care patient of Newlin, Enobong, MD and is actively engaged with the care management team. I reached out to [name] by phone today to assist with re-scheduling  with the Licensed Clinical Child psychotherapist.  Follow up plan: Telephone appointment with complex care management team member scheduled for:  06/21/24  Harlene Satterfield  [facility]  [facility] Guide  Direct Dial: [phone]  Fax [phone]

## 2024-06-15 NOTE — Telephone Encounter (Signed)
 Copied from CRM [id number]. Topic: General - Other >> Jun 15, 2024 10:20 AM Rosaria BRAVO wrote:  Reason for CRM: Pt is requesting to speak to Rolin Kerns, pt says this is urgent.   Best contact: [phone]

## 2024-06-16 ENCOUNTER — Ambulatory Visit

## 2024-06-19 ENCOUNTER — Telehealth: Admitting: Physician Assistant

## 2024-06-19 DIAGNOSIS — J069 Acute upper respiratory infection, unspecified: Secondary | ICD-10-CM

## 2024-06-19 DIAGNOSIS — W19XXXA Unspecified fall, initial encounter: Secondary | ICD-10-CM

## 2024-06-19 DIAGNOSIS — M25512 Pain in left shoulder: Secondary | ICD-10-CM

## 2024-06-19 DIAGNOSIS — M25561 Pain in right knee: Secondary | ICD-10-CM

## 2024-06-19 DIAGNOSIS — S0990XA Unspecified injury of head, initial encounter: Secondary | ICD-10-CM | POA: Diagnosis not present

## 2024-06-19 DIAGNOSIS — M542 Cervicalgia: Secondary | ICD-10-CM

## 2024-06-19 DIAGNOSIS — M25562 Pain in left knee: Secondary | ICD-10-CM

## 2024-06-19 MED ORDER — BENZONATATE 100 MG PO CAPS
100.0000 mg | ORAL_CAPSULE | Freq: Three times a day (TID) | ORAL | 0 refills | Status: AC
Start: 2024-06-19 — End: 2024-06-24

## 2024-06-19 MED ORDER — FLUTICASONE PROPIONATE 50 MCG/ACT NA SUSP: 2.0000 | Freq: Every day | NASAL | 0 refills | Status: AC

## 2024-06-19 NOTE — Progress Notes (Signed)
 Ms. [name] [name], [name] [name] are scheduled for a virtual visit with your provider today.    Just as we do with appointments in the office, we must obtain your consent to participate.  Your consent will be active for this visit and any virtual visit you may have with one of our providers in the next 365 days.    If you have a MyChart account, I can also send a copy of this consent to you electronically.  All virtual visits are billed to your insurance company just like a traditional visit in the office.  As this is a virtual visit, video technology does not allow for your provider to perform a traditional examination.  This may limit your provider's ability to fully assess your condition.  If your provider identifies any concerns that need to be evaluated in person or the need to arrange testing such as labs, EKG, etc, we will make arrangements to do so.    Although advances in technology are sophisticated, we cannot ensure that it will always work on either your end or our end.  If the connection with a video visit is poor, we may have to switch to a telephone visit.  With either a video or telephone visit, we are not always able to ensure that we have a secure connection.   I need to obtain your verbal consent now.   Are you willing to proceed with your visit today?   [name] [name] has provided verbal consent on 06/19/2024 for a virtual visit (video or telephone).   [name] GORMAN Snuffer, [name] [name] 06/19/2024  10:42 AM   Date:  06/19/2024   ID:  [name] [name], DOB December 15, 1980, MRN [id number]  Patient Location: Home Provider Location: Home Office   Participants: Patient and Provider for Visit and Wrap up  Method of visit: Video  Location of Patient: Home Location of Provider: Home Office Consent was obtain for visit over the video. Services rendered by provider: Visit was performed via video  A video enabled telemedicine application was used and I verified that I am speaking with the correct person using two  identifiers.  PCP:  Delbert Clam, MD   Chief Complaint:  fall, uri sxs  History of Present Illness:    [name] [name] is a 43 y.o. female with history as stated below. Presents video telehealth for an acute care visit  Pt states she had a fall last week 6 days ago. States she thinks she lost her balance and tripped on an area of unlevel ground. She reports she has pain to both of her knees, left shoulder, left neck and also hit her head. She denies loss of consciousness. She is not anticoagulated. She reports she has had a headache, dizziness/lightheadedness, left sided weakness (unclear if this is pain related).  Denies vision changes or vomiting.  She also reports a cough, sore throat, rhinorrhea, and congestion for 3 days. She denies fevers.   Past Medical, Surgical, Social History, Allergies, and Medications have been Reviewed.  Past Medical History:  Diagnosis Date   Allergy    Anemia    has taken iron supplements in the past   Arthritis    Bipolar affective disorder (HCC)    no meds currently;has been years since taking meds   Carpal tunnel syndrome on both sides    Gestational diabetes    H/O varicella    Hypertension    Pt states she had pre-eclampsia w last pregnancy    Infection    Yeast inf;not frequent  Migraines    would be rx'd Percacet for relief   Pregnancy induced hypertension    Seizures (HCC) 05/20/2011   unsure what started seizures;Neurology appt on 05/30/12;on Kepra 2000mg  bid;was on Topramax 50mg  tid   Wears glasses    Yeast infection     No outpatient medications have been marked as taking for the 06/19/24 encounter (Video Visit) with [facility] PROVIDER.     Allergies:   Levetiracetam  and Tramadol   ROS See HPI for history of present illness.  Physical Exam Constitutional:      Appearance: Normal appearance. She is not ill-appearing.  Cardiovascular:     Rate and Rhythm: Normal rate.  Pulmonary:     Effort: Pulmonary effort is  normal.  Neurological:     Mental Status: She is alert.     MDM: Pt presenting for eval of fall with head injury, neck pain, left shoulder pain and bilat knee pain. Is not anticoagulated. Having persistent headaches, dizziness/lightheadedness. This may be post concussive in nature however I am unable to complete a full neuro exam and therefor cannot fully evaluate this complaint or the complaints regarding her shoulder and bilat knee injuries. Advised that she needs an in person visit for a physical exam to further evaluate these complaints. Regarding the uri sxs, these seem mild in nature, there have been no fevers and sxs ongoing for only 3 days. Likely viral syndrome. Will send rx for supportive meds.    Tests Ordered: No orders of the defined types were placed in this encounter.   Medication Changes: No orders of the defined types were placed in this encounter.    Disposition:  Follow up  Signed, [name] GORMAN Snuffer, [name] [name]  06/19/2024 10:42 AM

## 2024-06-19 NOTE — Patient Instructions (Signed)
 [name], thank you for joining Lynden GORMAN Snuffer, PA-C for today's virtual visit.  While this provider is not your primary care provider (PCP), if your PCP is located in our provider database this encounter information will be shared with them immediately following your visit.   A [facility] MyChart account gives you access to today's visit and all your visits, tests, and labs performed at [facility]  click here if you don't have a [facility] MyChart account or go to [email or web address]  Consent: (Patient) [name] [name] provided verbal consent for this virtual visit at the beginning of the encounter.  Current Medications:  Current Outpatient Medications:    ammonium lactate  (AMLACTIN) 12 % cream, Apply 1 Application topically as needed for dry skin., Disp: 385 g, Rfl: 3   atorvastatin  (LIPITOR) 20 MG tablet, Take 1 tablet (20 mg total) by mouth daily., Disp: 90 tablet, Rfl: 1   cetirizine  (ZYRTEC  ALLERGY) 10 MG tablet, Take 1 tablet (10 mg total) by mouth daily for 14 days., Disp: 14 tablet, Rfl: 0   COVID-19 At-Home Test KIT, Use to test for COVID., Disp: 1 kit, Rfl: 0   diclofenac  (VOLTAREN ) 75 MG EC tablet, Take 1 tablet (75 mg total) by mouth 2 (two) times daily as needed., Disp: 60 tablet, Rfl: 2   fluconazole  (DIFLUCAN ) 150 MG tablet, Take one tablet by mouth x 1 day. May repeat in 72 hours if symptoms don't improve., Disp: 2 tablet, Rfl: 0   furosemide  (LASIX ) 20 MG tablet, Take 1 tablet (20 mg total) by mouth daily. X 5days for swelling, Disp: 15 tablet, Rfl: 3   gabapentin  (NEURONTIN ) 300 MG capsule, Take 1 capsule (300 mg total) by mouth 3 (three) times daily., Disp: 90 capsule, Rfl: 4   hydrOXYzine  (ATARAX ) 25 MG tablet, Take 1 tablet (25 mg total) by mouth every 8 (eight) hours as needed for anxiety., Disp: 12 tablet, Rfl: 0   ibuprofen  (ADVIL ) 800 MG tablet, Take 800 mg by mouth every 6 (six) hours as needed., Disp: , Rfl:    ketoconazole  (NIZORAL ) 2 %  shampoo, Apply 1 Application topically 2 (two) times a week. X 2 weeks, Disp: 240 mL, Rfl: 0   lacosamide  (VIMPAT ) 50 MG TABS tablet, Take 1 tablet (50 mg total) by mouth 2 (two) times daily. (Patient not taking: Reported on 03/28/2024), Disp: 60 tablet, Rfl: 11   lamoTRIgine (LAMICTAL XR) 100 MG 24 hour tablet, Take 100 mg by mouth daily., Disp: , Rfl:    meloxicam  (MOBIC ) 7.5 MG tablet, TAKE 1 TABLET(7.5 MG) BY MOUTH DAILY AS NEEDED FOR PAIN, Disp: 30 tablet, Rfl: 1   metFORMIN  (GLUCOPHAGE ) 1000 MG tablet, Take 1 tablet (1,000 mg total) by mouth 2 (two) times daily with a meal., Disp: 180 tablet, Rfl: 3   nystatin  cream (MYCOSTATIN ), Apply 1 Application topically 2 (two) times daily., Disp: 60 g, Rfl: 0   ondansetron  (ZOFRAN -ODT) 4 MG disintegrating tablet, Take 1 tablet (4 mg total) by mouth every 8 (eight) hours as needed., Disp: 20 tablet, Rfl: 0   phenytoin  (DILANTIN ) 100 MG ER capsule, Take 2 capsules (200 mg total) by mouth 2 (two) times daily., Disp: 120 capsule, Rfl: 5   tirzepatide  (MOUNJARO ) 5 MG/0.5ML Pen, Inject 5 mg into the skin once a week., Disp: 6 mL, Rfl: 1   tiZANidine  (ZANAFLEX ) 4 MG tablet, Take by mouth., Disp: , Rfl:    topiramate  (TOPAMAX ) 200 MG tablet, TAKE 1 TABLET(200 MG) BY MOUTH TWICE DAILY, Disp: 60 tablet,  Rfl: 5   topiramate  (TOPAMAX ) 25 MG tablet, Take 25 mg by mouth daily., Disp: , Rfl:    VENTOLIN  HFA 108 (90 Base) MCG/ACT inhaler, INHALE 2 PUFFS INTO THE LUNGS EVERY 6 HOURS AS NEEDED FOR WHEEZING OR SHORTNESS OF BREATH, Disp: 18 g, Rfl: 0   Medications ordered in this encounter:  No orders of the defined types were placed in this encounter.    *If you need refills on other medications prior to your next appointment, please contact your pharmacy*  Follow-Up: Call back or seek an in-person evaluation if the symptoms worsen or if the condition fails to improve as anticipated.  [facility] [phone]  Other Instructions For your respiratory  symptoms I have send a prescription for cough medication and medication for your congestion.   Regarding your fall and the injuries from this incident, these injuries are unable to be fully evaluated with a video visit and it was recommended that you be seen for an in person visit regarding this.  Follow up with your regular doctor in 1 week for reassessment and seek care sooner if your symptoms worsen or fail to improve.    If you have been instructed to have an in-person evaluation today at a local Urgent Care facility, please use the link below. It will take you to a list of all of our available [facility], including address, phone number and hours of operation. Please do not delay care.  [facility]  If you or a family member do not have a primary care provider, use the link below to schedule a visit and establish care. When you choose a [facility] primary care physician or advanced practice provider, you gain a long-term partner in health. Find a Primary Care Provider  Learn more about [facility] in-office and virtual care options: [facility] - Get Care Now

## 2024-06-21 ENCOUNTER — Other Ambulatory Visit: Payer: Self-pay | Admitting: Licensed Clinical Social Worker

## 2024-06-21 DIAGNOSIS — F3161 Bipolar disorder, current episode mixed, mild: Secondary | ICD-10-CM

## 2024-06-21 DIAGNOSIS — F419 Anxiety disorder, unspecified: Secondary | ICD-10-CM

## 2024-06-21 NOTE — Patient Outreach (Signed)
 Complex Care Management   Visit Note  06/21/2024  Name:  [name] [name] MRN: [id number] DOB: 03/14/1981  Situation: Referral received for Complex Care Management related to Mental/Behavioral Health diagnosis Depression and Bipolar Disorder I obtained verbal consent from Patient.  Visit completed with Patient  on the phone  Background:   Past Medical History:  Diagnosis Date   Allergy    Anemia    has taken iron supplements in the past   Arthritis    Bipolar affective disorder (HCC)    no meds currently;has been years since taking meds   Carpal tunnel syndrome on both sides    Gestational diabetes    H/O varicella    Hypertension    Pt states she had pre-eclampsia w last pregnancy    Infection    Yeast inf;not frequent   Migraines    would be rx'd Percacet for relief   Pregnancy induced hypertension    Seizures (HCC) 05/20/2011   unsure what started seizures;Neurology appt on 05/30/12;on Kepra 2000mg  bid;was on Topramax 50mg  tid   Wears glasses    Yeast infection     Assessment: Patient Reported Symptoms:  Cognitive Cognitive Status: No symptoms reported, Alert and oriented to person, place, and time, Normal speech and language skills Cognitive/Intellectual Conditions Management [RPT]: None reported or documented in medical history or problem list   Health Maintenance Behaviors: Annual physical exam, Spiritual practice(s)  Neurological Neurological Review of Symptoms: No symptoms reported    HEENT HEENT Symptoms Reported: Not assessed      Cardiovascular Cardiovascular Symptoms Reported: Not assessed    Respiratory Respiratory Symptoms Reported: Not assesed    Endocrine Endocrine Symptoms Reported: Not assessed    Gastrointestinal Gastrointestinal Symptoms Reported: Not assessed      Genitourinary Genitourinary Symptoms Reported: Not assessed    Integumentary Integumentary Symptoms Reported: Not assessed    Musculoskeletal Musculoskelatal Symptoms Reviewed: Not  assessed        Psychosocial Psychosocial Symptoms Reported: Avoiding people, places, activities, Intrusive thoughts or memories Additional Psychological Details: Pt identified triggers to recent stressors, such as, living environment, negative experience at recent PCP appt Behavioral Management Strategies: Adequate rest, Coping strategies, Community resources, Support system, Medication therapy Major Change/Loss/Stressor/Fears (CP): Medical condition, self, Environment Techniques to [employer] with Loss/Stress/Change: Diversional activities, Spiritual practice(s) Quality of Family Relationships: involved, supportive (Mother and brother)    06/21/2024    PHQ2-9 Depression Screening   Little interest or pleasure in doing things    Feeling down, depressed, or hopeless    PHQ-2 - Total Score    Trouble falling or staying asleep, or sleeping too much    Feeling tired or having little energy    Poor appetite or overeating     Feeling bad about yourself - or that you are a failure or have let yourself or your family down    Trouble concentrating on things, such as reading the newspaper or watching television    Moving or speaking so slowly that other people could have noticed.  Or the opposite - being so fidgety or restless that you have been moving around a lot more than usual    Thoughts that you would be better off dead, or hurting yourself in some way    PHQ2-9 Total Score    If you checked off any problems, how difficult have these problems made it for you to do your work, take care of things at home, or get along with other people    Depression Interventions/Treatment  There were no vitals filed for this visit.  Medications Reviewed Today     Reviewed by Ezzard Rolin BIRCH, LCSW (Social Worker) on 06/21/24 at 1650  Med List Status: <None>   Medication Order Taking? Sig Documenting Provider Last Dose Status Informant  ammonium lactate  (AMLACTIN) 12 % cream [id number] No Apply 1  Application topically as needed for dry skin. McCaughan, Dia D, DPM Unknown Active   atorvastatin  (LIPITOR) 20 MG tablet [id number] No Take 1 tablet (20 mg total) by mouth daily. Danton Slough M, PA-C Unknown Active   benzonatate  (TESSALON ) 100 MG capsule [id number]  Take 1 capsule (100 mg total) by mouth every 8 (eight) hours for 5 days. Couture, Cortni S, PA-C  Active   cetirizine  (ZYRTEC  ALLERGY) 10 MG tablet [id number] No Take 1 tablet (10 mg total) by mouth daily for 14 days. Rolan Berthold, PA-C Unknown Expired 02/20/24 2359   COVID-19 At-Home Test KIT [id number] No Use to test for COVID. Gladis Elsie BROCKS, PA-C Unknown Active   diclofenac  (VOLTAREN ) 75 MG EC tablet [id number] No Take 1 tablet (75 mg total) by mouth 2 (two) times daily as needed. Jule Ronal CROME, PA-C Unknown Active   fluconazole  (DIFLUCAN ) 150 MG tablet [id number]  Take one tablet by mouth x 1 day. May repeat in 72 hours if symptoms don't improve. Lucienne, Safal, PA-C  Active   fluticasone  (FLONASE ) 50 MCG/ACT nasal spray [id number]  Place 2 sprays into both nostrils daily. Couture, Cortni S, PA-C  Active   furosemide  (LASIX ) 20 MG tablet [id number] No Take 1 tablet (20 mg total) by mouth daily. X 5days for swelling Danton Slough M, PA-C Unknown Active   gabapentin  (NEURONTIN ) 300 MG capsule [id number] No Take 1 capsule (300 mg total) by mouth 3 (three) times daily. Danton Slough HERO, PA-C 04/21/2024 Morning Active   hydrOXYzine  (ATARAX ) 25 MG tablet [id number]  Take 1 tablet (25 mg total) by mouth every 8 (eight) hours as needed for anxiety. Vonna Sharlet POUR, MD  Active   ibuprofen  (ADVIL ) 800 MG tablet [id number] No Take 800 mg by mouth every 6 (six) hours as needed. [provider] Unknown Active   ketoconazole  (NIZORAL ) 2 % shampoo [id number] No Apply 1 Application topically 2 (two) times a week. X 2 weeks Vivienne Nest M, PA-C Unknown Active   lacosamide  (VIMPAT ) 50 MG TABS tablet [id number] No Take 1 tablet (50 mg  total) by mouth 2 (two) times daily.  Patient not taking: Reported on 03/28/2024   Skeet Juliene SAUNDERS, DO Unknown Active   lamoTRIgine (LAMICTAL XR) 100 MG 24 hour tablet [id number] No Take 100 mg by mouth daily. [provider] Unknown Active   meloxicam  (MOBIC ) 7.5 MG tablet [id number] No TAKE 1 TABLET(7.5 MG) BY MOUTH DAILY AS NEEDED FOR PAIN Newlin, Enobong, MD Unknown Active   metFORMIN  (GLUCOPHAGE ) 1000 MG tablet [id number] No Take 1 tablet (1,000 mg total) by mouth 2 (two) times daily with a meal. Danton Slough HERO, PA-C 04/21/2024 Morning Active   nystatin  cream (MYCOSTATIN ) [id number] No Apply 1 Application topically 2 (two) times daily. Vivienne Nest M, PA-C Unknown Active   ondansetron  (ZOFRAN -ODT) 4 MG disintegrating tablet [id number] No Take 1 tablet (4 mg total) by mouth every 8 (eight) hours as needed. Vivienne Nest M, PA-C Unknown Active   phenytoin  (DILANTIN ) 100 MG ER capsule [id number]  Take 2 capsules (200 mg total) by mouth 2 (two) times daily. Skeet Juliene SAUNDERS, DO  Active   tirzepatide  (MOUNJARO ) 5 MG/0.5ML Pen [id number]  Inject 5 mg into the skin once a week. Newlin, Enobong, MD  Active   tiZANidine  (ZANAFLEX ) 4 MG tablet [id number] No Take by mouth. [provider] Unknown Active   topiramate  (TOPAMAX ) 200 MG tablet [id number] No TAKE 1 TABLET(200 MG) BY MOUTH TWICE DAILY Skeet Juliene SAUNDERS, DO 04/21/2024 Morning Active   topiramate  (TOPAMAX ) 25 MG tablet [id number] No Take 25 mg by mouth daily. [provider] Unknown Active   VENTOLIN  HFA 108 (90 Base) MCG/ACT inhaler [id number] No INHALE 2 PUFFS INTO THE LUNGS EVERY 6 HOURS AS NEEDED FOR WHEEZING OR SHORTNESS OF BREATH Newlin, Enobong, MD Unknown Active             Recommendation:   Continue Current Plan of Care  Follow Up Plan:   Telephone follow-up in 1 month  Rolin Ezzard [facility]UGHS [facility]  [facility], [facility] Clinical Social Worker Direct Dial: [phone]  Fax:  [phone] Website: [email or web address] 5:18 PM

## 2024-06-21 NOTE — Patient Instructions (Signed)
 Visit Information  Thank you for taking time to visit with me today. Please don't hesitate to contact me if I can be of assistance to you before our next scheduled appointment.  Your next care management appointment is by telephone on 09/29 at 10 AM  Please call the care guide team at [phone] if you need to cancel, schedule, or reschedule an appointment.   Please call the Suicide and Crisis Lifeline: 988 go to [facility] [address], [place] [phone]) call 911 if you are experiencing a Mental Health or Behavioral Health Crisis or need someone to talk to.  Rolin Kerns, LCSW   [facility], [facility] Clinical Social Worker Direct Dial: [phone]  Fax: [phone] Website: [email or web address] 5:19 PM

## 2024-06-23 ENCOUNTER — Telehealth: Payer: Self-pay

## 2024-06-23 NOTE — Progress Notes (Signed)
   Telephone encounter was:  Successful.  Complex Care Management Note Care Guide Note  06/23/2024 Name: [name] [name] MRN: [id number] DOB: 29-Oct-1980  [name] [name] is a 43 y.o. year old female who is a primary care patient of Newlin, Enobong, MD . The community resource team was consulted for assistance with Food Insecurity, Financial Difficulties related to financial strain, and Housing   SDOH screenings and interventions completed:  Yes  Social Drivers of Health From This Encounter   Food Insecurity: Food Insecurity Present (06/23/2024)   Hunger Vital Sign    Worried About Running Out of Food in the Last Year: Sometimes true    Ran Out of Food in the Last Year: Sometimes true  Housing: Unknown (06/23/2024)   Housing Stability Vital Sign    Unable to Pay for Housing in the Last Year: No    Homeless in the Last Year: No  Utilities: At Risk (06/23/2024)   Utilities    Threatened with loss of utilities: Yes    SDOH Interventions Today    Flowsheet Row Most Recent Value  SDOH Interventions   Food Insecurity Interventions Community Resources Provided  Housing Interventions Community Resources Provided  Utilities Interventions Community Resources Provided  Financial Strain Interventions Community Resources Provided, [facility] Resource Referral     Care guide performed the following interventions: Patient provided with information about care guide support team and interviewed to confirm resource needs.Pt is looking for new housing resources for other counties and is requesting I mail resources to her and I did give resources over the phone as well for food, housing, financial, home schooling and senior resources.   Follow Up Plan:  No further follow up planned at this time. The patient has been provided with needed resources.  Encounter Outcome:  Patient Visit Completed    Jon Colt [facility]  [facility] Guide, Phone: [phone]  Fax: [phone] Website: [email or web address]

## 2024-06-28 ENCOUNTER — Other Ambulatory Visit: Payer: Self-pay

## 2024-06-28 ENCOUNTER — Ambulatory Visit (INDEPENDENT_AMBULATORY_CARE_PROVIDER_SITE_OTHER): Admitting: Radiology

## 2024-06-28 ENCOUNTER — Ambulatory Visit
Admission: RE | Admit: 2024-06-28 | Discharge: 2024-06-28 | Disposition: A | Discharge status: Home / Self Care | Attending: Emergency Medicine | Admitting: Emergency Medicine

## 2024-06-28 VITALS — BP 155/101 | HR 95 | Temp 98.1°F | Resp 18 | Ht 61.0 in | Wt 350.0 lb

## 2024-06-28 DIAGNOSIS — M25572 Pain in left ankle and joints of left foot: Secondary | ICD-10-CM

## 2024-06-28 LAB — POCT URINE DIPSTICK
Bilirubin, UA: NEGATIVE
Blood, UA: NEGATIVE
Glucose, UA: NEGATIVE mg/dL
Ketones, POC UA: NEGATIVE mg/dL
Leukocytes, UA: NEGATIVE
Nitrite, UA: NEGATIVE
POC PROTEIN,UA: NEGATIVE
Spec Grav, UA: 1.015 (ref 1.010–1.025)
Urobilinogen, UA: 0.2 U/dL
pH, UA: 7.5 (ref 5.0–8.0)

## 2024-06-28 LAB — POCT URINE PREGNANCY: Preg Test, Ur: NEGATIVE

## 2024-06-28 MED ORDER — ACETAMINOPHEN 500 MG PO TABS: 500.0000 mg | ORAL_TABLET | Freq: Four times a day (QID) | ORAL | 0 refills | Status: AC | PRN

## 2024-06-28 NOTE — ED Triage Notes (Addendum)
 Pt presents with a chief complaint of fall. States this was approximately 2-3 weeks ago. Reports she was walking to her car, tripped and fell. Pt states she hit her head and is having left foot/ankle pain + swelling. Currently rates overall pain a 3/10. OTC Tylenol  taken with temporary relief.  Pt also states people are following me x 1 month. This is a daily occurrence. She explains I believe someone is trying to play with my mental game. I see people behind + in-front of me all the time. I am scared. I am living in fear right now.
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
## 2024-06-28 NOTE — Discharge Instructions (Addendum)
 Good news, radiology did not see any fractures or breaks of your ankle.  Use the Ace wrap throughout the day to help provide compression, you can ice and elevate it as well.  Take Tylenol  as needed for pain.  Follow-up with Ortho if your pain persists.

## 2024-07-10 ENCOUNTER — Telehealth: Admitting: Licensed Clinical Social Worker

## 2024-07-12 ENCOUNTER — Other Ambulatory Visit: Payer: Medicare Other

## 2024-07-17 ENCOUNTER — Telehealth: Payer: Self-pay | Admitting: Licensed Clinical Social Worker

## 2024-07-17 ENCOUNTER — Encounter: Payer: Self-pay | Admitting: Licensed Clinical Social Worker

## 2024-07-17 NOTE — Patient Instructions (Signed)
 [name] [name] - I am sorry I was unable to reach you today for our scheduled appointment. I work with Newlin, Enobong, MD and am calling to support your healthcare needs. Please contact me at [phone] at your earliest convenience. I look forward to speaking with you soon.   Thank you,  Rolin Kerns, LCSW [employer]  [facility], [facility] Clinical Social Worker Direct Dial: [phone]  Fax: [phone] Website: [email or web address] 4:32 PM

## 2024-08-07 ENCOUNTER — Other Ambulatory Visit: Payer: Self-pay | Admitting: Licensed Clinical Social Worker

## 2024-08-07 NOTE — Patient Outreach (Signed)
 Complex Care Management   Visit Note  08/07/2024  Name:  [name] [name] MRN: [id number] DOB: [birth year]  Situation: Referral received for Complex Care Management related to Mental/Behavioral Health diagnosis Bipolar and Depression I obtained verbal consent from Patient.  Visit completed with Patient  on the phone  Background:   Past Medical History:  Diagnosis Date   Allergy    Anemia    has taken iron supplements in the past   Arthritis    Bipolar affective disorder (HCC)    no meds currently;has been years since taking meds   Carpal tunnel syndrome on both sides    Gestational diabetes    H/O varicella    Hypertension    Pt states she had pre-eclampsia w last pregnancy    Infection    Yeast inf;not frequent   Migraines    would be rx'd Percacet for relief   Pregnancy induced hypertension    Seizures (HCC) 05/20/2011   unsure what started seizures;Neurology appt on 05/30/12;on Kepra 2000mg  bid;was on Topramax 50mg  tid   Wears glasses    Yeast infection     Assessment: Patient Reported Symptoms:  Cognitive Cognitive Status: No symptoms reported, Alert and oriented to person, place, and time, Normal speech and language skills   Health Maintenance Behaviors: Annual physical exam  Neurological Neurological Review of Symptoms: No symptoms reported    HEENT HEENT Symptoms Reported: No symptoms reported      Cardiovascular Cardiovascular Symptoms Reported: No symptoms reported    Respiratory Respiratory Symptoms Reported: No symptoms reported    Endocrine Endocrine Symptoms Reported: No symptoms reported Is patient diabetic?: Yes    Gastrointestinal Gastrointestinal Symptoms Reported: No symptoms reported      Genitourinary Genitourinary Symptoms Reported: No symptoms reported    Integumentary Integumentary Symptoms Reported: No symptoms reported    Musculoskeletal Musculoskelatal Symptoms Reviewed: Difficulty walking, Unsteady gait Musculoskeletal Management  Strategies: Coping strategies, Medication therapy, Routine screening      Psychosocial Psychosocial Symptoms Reported: No symptoms reported Behavioral Management Strategies: Adequate rest, Support system, Coping strategies Major Change/Loss/Stressor/Fears (CP): Medical condition, self Techniques to Cope with Loss/Stress/Change: Diversional activities, Spiritual practice(s)      08/07/2024    PHQ2-9 Depression Screening   Little interest or pleasure in doing things    Feeling down, depressed, or hopeless    PHQ-2 - Total Score    Trouble falling or staying asleep, or sleeping too much    Feeling tired or having little energy    Poor appetite or overeating     Feeling bad about yourself - or that you are a failure or have let yourself or your family down    Trouble concentrating on things, such as reading the newspaper or watching television    Moving or speaking so slowly that other people could have noticed.  Or the opposite - being so fidgety or restless that you have been moving around a lot more than usual    Thoughts that you would be better off dead, or hurting yourself in some way    PHQ2-9 Total Score    If you checked off any problems, how difficult have these problems made it for you to do your work, take care of things at home, or get along with other people    Depression Interventions/Treatment      There were no vitals filed for this visit.  Medications Reviewed Today     Reviewed by Ezzard Rolin BIRCH, LCSW (Social Worker) on 08/07/24 at [phone]  Med List Status: <None>   Medication Order Taking? Sig Documenting Provider Last Dose Status Informant  acetaminophen  (TYLENOL ) 500 MG tablet [id number]  Take 1 tablet (500 mg total) by mouth every 6 (six) hours as needed. Dreama, [place]  N, FNP  Active   ammonium lactate  (AMLACTIN) 12 % cream [id number] No Apply 1 Application topically as needed for dry skin. McCaughan, Dia D, DPM Unknown Active   atorvastatin  (LIPITOR) 20 MG  tablet [id number] No Take 1 tablet (20 mg total) by mouth daily. Danton Slough M, PA-C Unknown Active   cetirizine  (ZYRTEC  ALLERGY) 10 MG tablet [id number] No Take 1 tablet (10 mg total) by mouth daily for 14 days. Rolan Berthold, PA-C Unknown Expired 02/20/24 2359   COVID-19 At-Home Test KIT [id number] No Use to test for COVID. Gladis Elsie BROCKS, PA-C Unknown Active   diclofenac  (VOLTAREN ) 75 MG EC tablet [id number] No Take 1 tablet (75 mg total) by mouth 2 (two) times daily as needed. Jule Ronal CROME, PA-C Unknown Active   fluconazole  (DIFLUCAN ) 150 MG tablet [id number]  Take one tablet by mouth x 1 day. May repeat in 72 hours if symptoms don't improve. Gandhi, Safal, PA-C  Active   fluticasone  (FLONASE ) 50 MCG/ACT nasal spray [id number]  Place 2 sprays into both nostrils daily. Couture, Cortni S, PA-C  Active   furosemide  (LASIX ) 20 MG tablet [id number] No Take 1 tablet (20 mg total) by mouth daily. X 5days for swelling Danton Slough M, PA-C Unknown Active   gabapentin  (NEURONTIN ) 300 MG capsule [id number] No Take 1 capsule (300 mg total) by mouth 3 (three) times daily. Danton Slough HERO, PA-C 04/21/2024 Morning Active   hydrOXYzine  (ATARAX ) 25 MG tablet [id number]  Take 1 tablet (25 mg total) by mouth every 8 (eight) hours as needed for anxiety. Vonna Sharlet POUR, MD  Active   ibuprofen  (ADVIL ) 800 MG tablet [id number] No Take 800 mg by mouth every 6 (six) hours as needed. [provider] Unknown Active   ketoconazole  (NIZORAL ) 2 % shampoo [id number] No Apply 1 Application topically 2 (two) times a week. X 2 weeks Vivienne Nest M, PA-C Unknown Active   lacosamide  (VIMPAT ) 50 MG TABS tablet [id number] No Take 1 tablet (50 mg total) by mouth 2 (two) times daily.  Patient not taking: Reported on 03/28/2024   Skeet Juliene SAUNDERS, DO Unknown Active   lamoTRIgine (LAMICTAL XR) 100 MG 24 hour tablet [id number] No Take 100 mg by mouth daily. [provider] Unknown Active   meloxicam  (MOBIC ) 7.5 MG  tablet [id number] No TAKE 1 TABLET(7.5 MG) BY MOUTH DAILY AS NEEDED FOR PAIN Newlin, Enobong, MD Unknown Active   metFORMIN  (GLUCOPHAGE ) 1000 MG tablet [id number] No Take 1 tablet (1,000 mg total) by mouth 2 (two) times daily with a meal. Danton Slough HERO, PA-C 04/21/2024 Morning Active   nystatin  cream (MYCOSTATIN ) [id number] No Apply 1 Application topically 2 (two) times daily. Vivienne Nest M, PA-C Unknown Active   ondansetron  (ZOFRAN -ODT) 4 MG disintegrating tablet [id number] No Take 1 tablet (4 mg total) by mouth every 8 (eight) hours as needed. Vivienne Nest M, PA-C Unknown Active   phenytoin  (DILANTIN ) 100 MG ER capsule [id number]  Take 2 capsules (200 mg total) by mouth 2 (two) times daily. Skeet Juliene SAUNDERS, DO  Active   tirzepatide  (MOUNJARO ) 5 MG/0.5ML Pen [id number]  Inject 5 mg into the skin once a week. Newlin, Enobong, MD  Active   tiZANidine  (ZANAFLEX ) 4 MG tablet [id number] No Take by mouth. [provider] Unknown  Active   topiramate  (TOPAMAX ) 200 MG tablet [id number] No TAKE 1 TABLET(200 MG) BY MOUTH TWICE DAILY Skeet Juliene SAUNDERS, DO 04/21/2024 Morning Active   topiramate  (TOPAMAX ) 25 MG tablet [id number] No Take 25 mg by mouth daily. [provider] Unknown Active   VENTOLIN  HFA 108 (90 Base) MCG/ACT inhaler [id number] No INHALE 2 PUFFS INTO THE LUNGS EVERY 6 HOURS AS NEEDED FOR WHEEZING OR SHORTNESS OF BREATH Newlin, Enobong, MD Unknown Active             Recommendation:   Continue Current Plan of Care  Follow Up Plan:   Closing From:  Complex Care Management  Rolin Kerns, LCSW [place]  [facility], [facility] Clinical Social Worker Direct Dial: [phone]  Fax: [phone] Website: [email or web address] 4:33 PM

## 2024-08-07 NOTE — Patient Instructions (Signed)
 Visit Information  Thank you for taking time to visit with me today. Please don't hesitate to contact me if I can be of assistance to you before our next scheduled appointment.  Closing From: Complex Care Management.  Please call the care guide team at [phone] if you need to cancel, schedule, or reschedule an appointment.   Please call the Suicide and Crisis Lifeline: 988 go to [facility] [address] [place] [phone]) call 911 if you are experiencing a Mental Health or Behavioral Health Crisis or need someone to talk to.  Rolin Kerns, LCSW [place]  [facility], [facility] Clinical Social Worker Direct Dial: [phone]  Fax: [phone] Website: [email or web address] 4:33 PM

## 2024-08-14 ENCOUNTER — Other Ambulatory Visit: Payer: Self-pay | Admitting: Family Medicine

## 2024-08-14 DIAGNOSIS — M25512 Pain in left shoulder: Secondary | ICD-10-CM

## 2024-08-21 ENCOUNTER — Encounter: Payer: Self-pay | Admitting: Radiology

## 2024-08-22 ENCOUNTER — Other Ambulatory Visit: Payer: Self-pay

## 2024-08-22 DIAGNOSIS — Z1231 Encounter for screening mammogram for malignant neoplasm of breast: Secondary | ICD-10-CM

## 2024-09-01 ENCOUNTER — Ambulatory Visit
Admission: RE | Admit: 2024-09-01 | Discharge: 2024-09-01 | Disposition: A | Discharge status: Home / Self Care | Source: Ambulatory Visit

## 2024-09-01 DIAGNOSIS — Z1231 Encounter for screening mammogram for malignant neoplasm of breast: Secondary | ICD-10-CM

## 2024-09-26 ENCOUNTER — Encounter: Payer: Self-pay | Admitting: Physician Assistant

## 2024-09-26 ENCOUNTER — Ambulatory Visit: Admitting: Physician Assistant

## 2024-09-26 ENCOUNTER — Other Ambulatory Visit: Payer: Self-pay

## 2024-09-26 VITALS — Wt 350.0 lb

## 2024-09-26 DIAGNOSIS — G8929 Other chronic pain: Secondary | ICD-10-CM
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
## 2024-10-31 ENCOUNTER — Encounter: Payer: Self-pay | Admitting: Neurology

## 2024-10-31 ENCOUNTER — Other Ambulatory Visit

## 2024-10-31 ENCOUNTER — Ambulatory Visit (INDEPENDENT_AMBULATORY_CARE_PROVIDER_SITE_OTHER): Payer: Medicare Other | Admitting: Neurology

## 2024-10-31 VITALS — BP 149/99 | HR 92 | Ht 61.0 in | Wt 347.0 lb

## 2024-10-31 DIAGNOSIS — G40109 Localization-related (focal) (partial) symptomatic epilepsy and epileptic syndromes with simple partial seizures, not intractable, without status epilepticus: Secondary | ICD-10-CM

## 2024-10-31 DIAGNOSIS — M810 Age-related osteoporosis without current pathological fracture: Secondary | ICD-10-CM

## 2024-10-31 DIAGNOSIS — G43009 Migraine without aura, not intractable, without status migrainosus: Secondary | ICD-10-CM

## 2024-10-31 DIAGNOSIS — Z79899 Other long term (current) drug therapy: Secondary | ICD-10-CM

## 2024-10-31 MED ORDER — TOPIRAMATE 200 MG PO TABS: ORAL_TABLET | ORAL | 5 refills | Status: AC

## 2024-10-31 MED ORDER — PHENYTOIN SODIUM EXTENDED 100 MG PO CAPS: 200.0000 mg | ORAL_CAPSULE | Freq: Two times a day (BID) | ORAL | 5 refills | Status: AC

## 2024-10-31 NOTE — Patient Instructions (Addendum)
 Phenytoin  ER 200mg  twice daily Topiramate  200mg  twice daily Please take calcium  and vit D supplement (calcium  600mg  and D3 800 I U daily) to prevent osteoporosis while on phenytoin  Check bone density scan Check phenytoin  level Follow up one year

## 2024-11-01 LAB — PHENYTOIN LEVEL, TOTAL: Phenytoin, Total: 25 mg/L — ABNORMAL HIGH (ref 10.0–20.0)

## 2024-11-01 NOTE — Addendum Note (Signed)
 Addended by: OZELL JESUSA PARAS on: 11/01/2024 08:51 AM   Modules accepted: Orders

## 2024-11-03 ENCOUNTER — Ambulatory Visit
Admission: RE | Admit: 2024-11-03 | Discharge: 2024-11-03 | Disposition: A | Discharge status: Home / Self Care | Source: Ambulatory Visit | Attending: Family Medicine | Admitting: Family Medicine

## 2024-11-03 VITALS — BP 161/99 | HR 94 | Temp 97.9°F | Resp 16

## 2024-11-03 DIAGNOSIS — R030 Elevated blood-pressure reading, without diagnosis of hypertension: Secondary | ICD-10-CM

## 2024-11-03 DIAGNOSIS — R21 Rash and other nonspecific skin eruption: Secondary | ICD-10-CM

## 2024-11-03 NOTE — ED Triage Notes (Signed)
 Pt c/o rash under right breast that burns since Monday.  States she has used new body scrub.

## 2024-11-03 NOTE — Discharge Instructions (Signed)
 Use bacitracin to the open area of skin twice daily.  Avoid tight fitting clothing or bras until the area fully heals Do not use the scrub to this area, as it already has some skin breakdown If the area becomes itchy you can use topical hydrocortisone ointment twice daily  Your blood pressure was elevated today in clinic, follow-up with your primary care provider for further evaluation  Return to clinic for any new or urgent symptoms
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
## 2024-11-08 ENCOUNTER — Ambulatory Visit: Payer: Self-pay | Admitting: Neurology

## 2024-11-08 DIAGNOSIS — Z79899 Other long term (current) drug therapy: Secondary | ICD-10-CM

## 2024-11-10 NOTE — Telephone Encounter (Signed)
-----   Message from Juliene Dunnings, DO sent at 11/09/2024  4:36 PM EST ----- Next week ----- Message ----- From: Ozell Jesusa PARAS, CMA Sent: 11/09/2024   2:57 PM EST To: Juliene JONELLE Dunnings, DO  Should she repeat next week or later ----- Message ----- From: Dunnings Juliene JONELLE, DO Sent: 11/08/2024   4:23 PM EST To: [id number]  The dilantin  level is a little elevated.  I would like to repeat it as a trough level (meaning that she should get the lab done in the morning before taking her dilantin .  Once she has the lab drawn,  then immediately take the dilantin ).

## 2024-11-10 NOTE — Telephone Encounter (Signed)
 Patient advised to come next week sometime.   Per patient she will try and come after her daughter gets on the bus 8:20 am.

## 2025-05-22 ENCOUNTER — Ambulatory Visit

## 2025-10-31 ENCOUNTER — Ambulatory Visit: Payer: Self-pay | Admitting: Neurology
